# Patient Record
Sex: Female | Born: 1950 | Race: Black or African American | Hispanic: No | Smoking: Former smoker
Health system: Southern US, Community
[De-identification: ages and names within clinical notes are randomized; demographics above are authoritative.]

## PROBLEM LIST (undated history)

## (undated) DIAGNOSIS — K635 Polyp of colon: Secondary | ICD-10-CM

## (undated) DIAGNOSIS — D649 Anemia, unspecified: Secondary | ICD-10-CM

## (undated) DIAGNOSIS — B1920 Unspecified viral hepatitis C without hepatic coma: Secondary | ICD-10-CM

## (undated) DIAGNOSIS — Z972 Presence of dental prosthetic device (complete) (partial): Secondary | ICD-10-CM

## (undated) DIAGNOSIS — Z8719 Personal history of other diseases of the digestive system: Secondary | ICD-10-CM

## (undated) DIAGNOSIS — G709 Myoneural disorder, unspecified: Secondary | ICD-10-CM

## (undated) DIAGNOSIS — F419 Anxiety disorder, unspecified: Secondary | ICD-10-CM

## (undated) DIAGNOSIS — M1990 Unspecified osteoarthritis, unspecified site: Secondary | ICD-10-CM

## (undated) DIAGNOSIS — K219 Gastro-esophageal reflux disease without esophagitis: Secondary | ICD-10-CM

## (undated) DIAGNOSIS — Z973 Presence of spectacles and contact lenses: Secondary | ICD-10-CM

## (undated) DIAGNOSIS — F1020 Alcohol dependence, uncomplicated: Secondary | ICD-10-CM

## (undated) DIAGNOSIS — IMO0001 Reserved for inherently not codable concepts without codable children: Secondary | ICD-10-CM

## (undated) DIAGNOSIS — I10 Essential (primary) hypertension: Secondary | ICD-10-CM

## (undated) DIAGNOSIS — Z8711 Personal history of peptic ulcer disease: Secondary | ICD-10-CM

## (undated) DIAGNOSIS — Z9989 Dependence on other enabling machines and devices: Secondary | ICD-10-CM

## (undated) DIAGNOSIS — Z9289 Personal history of other medical treatment: Secondary | ICD-10-CM

## (undated) DIAGNOSIS — F329 Major depressive disorder, single episode, unspecified: Secondary | ICD-10-CM

## (undated) DIAGNOSIS — F32A Depression, unspecified: Secondary | ICD-10-CM

## (undated) DIAGNOSIS — K7460 Unspecified cirrhosis of liver: Secondary | ICD-10-CM

## (undated) DIAGNOSIS — G4700 Insomnia, unspecified: Secondary | ICD-10-CM

## (undated) HISTORY — DX: Reserved for inherently not codable concepts without codable children: IMO0001

## (undated) HISTORY — PX: DILATION AND CURETTAGE OF UTERUS: SHX78

## (undated) HISTORY — DX: Personal history of other diseases of the digestive system: Z87.19

## (undated) HISTORY — DX: Alcohol dependence, uncomplicated: F10.20

## (undated) HISTORY — DX: Personal history of peptic ulcer disease: Z87.11

## (undated) HISTORY — DX: Unspecified cirrhosis of liver: K74.60

## (undated) HISTORY — PX: ANAL FISSURE REPAIR: SHX2312

## (undated) HISTORY — DX: Unspecified osteoarthritis, unspecified site: M19.90

## (undated) HISTORY — DX: Personal history of other medical treatment: Z92.89

## (undated) HISTORY — PX: TUBAL LIGATION: SHX77

## (undated) HISTORY — PX: ROTATOR CUFF REPAIR: SHX139

## (undated) HISTORY — PX: OTHER SURGICAL HISTORY: SHX169

## (undated) HISTORY — PX: CARPAL TUNNEL RELEASE: SHX101

## (undated) HISTORY — DX: Polyp of colon: K63.5

## (undated) HISTORY — DX: Unspecified viral hepatitis C without hepatic coma: B19.20

---

## 1999-10-23 ENCOUNTER — Emergency Department (HOSPITAL_COMMUNITY): Admission: EM | Admit: 1999-10-23 | Discharge: 1999-10-23 | Payer: Self-pay | Admitting: *Deleted

## 2001-02-02 ENCOUNTER — Ambulatory Visit (HOSPITAL_BASED_OUTPATIENT_CLINIC_OR_DEPARTMENT_OTHER): Admission: RE | Admit: 2001-02-02 | Discharge: 2001-02-02 | Payer: Self-pay | Admitting: Orthopaedic Surgery

## 2002-02-01 ENCOUNTER — Ambulatory Visit (HOSPITAL_BASED_OUTPATIENT_CLINIC_OR_DEPARTMENT_OTHER): Admission: RE | Admit: 2002-02-01 | Discharge: 2002-02-01 | Payer: Self-pay | Admitting: Orthopedic Surgery

## 2002-03-25 ENCOUNTER — Ambulatory Visit (HOSPITAL_BASED_OUTPATIENT_CLINIC_OR_DEPARTMENT_OTHER): Admission: RE | Admit: 2002-03-25 | Discharge: 2002-03-25 | Payer: Self-pay | Admitting: Orthopedic Surgery

## 2002-09-19 ENCOUNTER — Ambulatory Visit (HOSPITAL_COMMUNITY): Admission: RE | Admit: 2002-09-19 | Discharge: 2002-09-19 | Payer: Self-pay

## 2003-06-03 ENCOUNTER — Emergency Department (HOSPITAL_COMMUNITY): Admission: EM | Admit: 2003-06-03 | Discharge: 2003-06-03 | Payer: Self-pay | Admitting: Emergency Medicine

## 2003-07-04 ENCOUNTER — Ambulatory Visit (HOSPITAL_BASED_OUTPATIENT_CLINIC_OR_DEPARTMENT_OTHER): Admission: RE | Admit: 2003-07-04 | Discharge: 2003-07-04 | Payer: Self-pay | Admitting: Orthopaedic Surgery

## 2003-08-10 ENCOUNTER — Emergency Department (HOSPITAL_COMMUNITY): Admission: EM | Admit: 2003-08-10 | Discharge: 2003-08-11 | Payer: Self-pay | Admitting: Emergency Medicine

## 2003-08-10 ENCOUNTER — Encounter: Payer: Self-pay | Admitting: Emergency Medicine

## 2004-09-23 ENCOUNTER — Emergency Department (HOSPITAL_COMMUNITY): Admission: EM | Admit: 2004-09-23 | Discharge: 2004-09-23 | Payer: Self-pay | Admitting: Emergency Medicine

## 2005-02-26 ENCOUNTER — Ambulatory Visit: Payer: Self-pay | Admitting: Internal Medicine

## 2005-03-05 ENCOUNTER — Ambulatory Visit: Payer: Self-pay | Admitting: *Deleted

## 2005-03-05 ENCOUNTER — Ambulatory Visit: Payer: Self-pay | Admitting: Internal Medicine

## 2005-04-02 ENCOUNTER — Ambulatory Visit: Payer: Self-pay | Admitting: Internal Medicine

## 2005-04-02 ENCOUNTER — Encounter (INDEPENDENT_AMBULATORY_CARE_PROVIDER_SITE_OTHER): Payer: Self-pay | Admitting: Internal Medicine

## 2005-04-02 LAB — CONVERTED CEMR LAB
ALT: 187 units/L
AST: 140 units/L

## 2005-04-18 ENCOUNTER — Ambulatory Visit: Payer: Self-pay | Admitting: Internal Medicine

## 2005-04-18 ENCOUNTER — Encounter (INDEPENDENT_AMBULATORY_CARE_PROVIDER_SITE_OTHER): Payer: Self-pay | Admitting: Internal Medicine

## 2005-04-18 DIAGNOSIS — B182 Chronic viral hepatitis C: Secondary | ICD-10-CM | POA: Insufficient documentation

## 2005-04-18 LAB — CONVERTED CEMR LAB: Hep B E Ab: NEGATIVE

## 2005-04-25 ENCOUNTER — Ambulatory Visit: Payer: Self-pay | Admitting: Internal Medicine

## 2005-04-28 ENCOUNTER — Ambulatory Visit: Payer: Self-pay | Admitting: Internal Medicine

## 2005-08-10 ENCOUNTER — Encounter (INDEPENDENT_AMBULATORY_CARE_PROVIDER_SITE_OTHER): Payer: Self-pay | Admitting: Internal Medicine

## 2005-09-02 ENCOUNTER — Ambulatory Visit: Payer: Self-pay | Admitting: Internal Medicine

## 2005-10-06 ENCOUNTER — Ambulatory Visit: Payer: Self-pay | Admitting: Internal Medicine

## 2005-10-06 ENCOUNTER — Encounter (INDEPENDENT_AMBULATORY_CARE_PROVIDER_SITE_OTHER): Payer: Self-pay | Admitting: Internal Medicine

## 2005-10-06 LAB — CONVERTED CEMR LAB: HCV Genotype: 1

## 2006-02-27 ENCOUNTER — Emergency Department (HOSPITAL_COMMUNITY): Admission: EM | Admit: 2006-02-27 | Discharge: 2006-02-27 | Payer: Self-pay | Admitting: Family Medicine

## 2006-04-22 ENCOUNTER — Emergency Department (HOSPITAL_COMMUNITY): Admission: EM | Admit: 2006-04-22 | Discharge: 2006-04-22 | Payer: Self-pay | Admitting: Family Medicine

## 2006-11-10 DIAGNOSIS — IMO0001 Reserved for inherently not codable concepts without codable children: Secondary | ICD-10-CM

## 2006-11-10 HISTORY — DX: Reserved for inherently not codable concepts without codable children: IMO0001

## 2007-03-05 ENCOUNTER — Inpatient Hospital Stay (HOSPITAL_COMMUNITY): Admission: EM | Admit: 2007-03-05 | Discharge: 2007-03-06 | Payer: Self-pay | Admitting: Family Medicine

## 2007-03-05 ENCOUNTER — Encounter (INDEPENDENT_AMBULATORY_CARE_PROVIDER_SITE_OTHER): Payer: Self-pay | Admitting: Internal Medicine

## 2007-03-05 ENCOUNTER — Ambulatory Visit: Payer: Self-pay | Admitting: Family Medicine

## 2007-03-05 DIAGNOSIS — R079 Chest pain, unspecified: Secondary | ICD-10-CM | POA: Insufficient documentation

## 2007-03-05 DIAGNOSIS — E7800 Pure hypercholesterolemia, unspecified: Secondary | ICD-10-CM | POA: Insufficient documentation

## 2007-03-05 DIAGNOSIS — R0789 Other chest pain: Secondary | ICD-10-CM | POA: Insufficient documentation

## 2007-07-05 ENCOUNTER — Encounter (INDEPENDENT_AMBULATORY_CARE_PROVIDER_SITE_OTHER): Payer: Self-pay | Admitting: Internal Medicine

## 2007-07-05 DIAGNOSIS — F329 Major depressive disorder, single episode, unspecified: Secondary | ICD-10-CM | POA: Insufficient documentation

## 2007-07-05 DIAGNOSIS — I10 Essential (primary) hypertension: Secondary | ICD-10-CM | POA: Insufficient documentation

## 2007-07-05 DIAGNOSIS — F32A Depression, unspecified: Secondary | ICD-10-CM | POA: Insufficient documentation

## 2007-07-05 DIAGNOSIS — K219 Gastro-esophageal reflux disease without esophagitis: Secondary | ICD-10-CM | POA: Insufficient documentation

## 2007-07-06 ENCOUNTER — Telehealth (INDEPENDENT_AMBULATORY_CARE_PROVIDER_SITE_OTHER): Payer: Self-pay | Admitting: Internal Medicine

## 2007-07-28 ENCOUNTER — Encounter (INDEPENDENT_AMBULATORY_CARE_PROVIDER_SITE_OTHER): Payer: Self-pay | Admitting: *Deleted

## 2007-07-29 ENCOUNTER — Ambulatory Visit: Payer: Self-pay | Admitting: Internal Medicine

## 2007-08-06 ENCOUNTER — Encounter (INDEPENDENT_AMBULATORY_CARE_PROVIDER_SITE_OTHER): Payer: Self-pay | Admitting: Internal Medicine

## 2007-08-18 ENCOUNTER — Encounter (INDEPENDENT_AMBULATORY_CARE_PROVIDER_SITE_OTHER): Payer: Self-pay | Admitting: Internal Medicine

## 2007-08-19 ENCOUNTER — Ambulatory Visit: Admission: RE | Admit: 2007-08-19 | Discharge: 2007-08-19 | Payer: Self-pay | Admitting: Cardiology

## 2007-08-19 DIAGNOSIS — IMO0001 Reserved for inherently not codable concepts without codable children: Secondary | ICD-10-CM

## 2007-08-19 HISTORY — DX: Reserved for inherently not codable concepts without codable children: IMO0001

## 2007-09-13 ENCOUNTER — Encounter (INDEPENDENT_AMBULATORY_CARE_PROVIDER_SITE_OTHER): Payer: Self-pay | Admitting: Internal Medicine

## 2007-10-01 ENCOUNTER — Ambulatory Visit: Payer: Self-pay | Admitting: Internal Medicine

## 2007-11-19 ENCOUNTER — Ambulatory Visit: Payer: Self-pay | Admitting: Internal Medicine

## 2007-11-23 ENCOUNTER — Ambulatory Visit (HOSPITAL_COMMUNITY): Admission: RE | Admit: 2007-11-23 | Discharge: 2007-11-23 | Payer: Self-pay | Admitting: Internal Medicine

## 2007-11-23 LAB — CONVERTED CEMR LAB
Eosinophils Absolute: 0 10*3/uL (ref 0.0–0.7)
Eosinophils Relative: 0 % (ref 0–5)
HCT: 42.7 % (ref 36.0–46.0)
Lymphs Abs: 3.2 10*3/uL (ref 0.7–4.0)
MCV: 95.5 fL (ref 78.0–100.0)
Platelets: 124 10*3/uL — ABNORMAL LOW (ref 150–400)
WBC: 6 10*3/uL (ref 4.0–10.5)

## 2007-12-03 ENCOUNTER — Encounter (INDEPENDENT_AMBULATORY_CARE_PROVIDER_SITE_OTHER): Payer: Self-pay | Admitting: Internal Medicine

## 2007-12-22 ENCOUNTER — Ambulatory Visit (HOSPITAL_COMMUNITY): Admission: RE | Admit: 2007-12-22 | Discharge: 2007-12-22 | Payer: Self-pay | Admitting: Internal Medicine

## 2007-12-25 ENCOUNTER — Encounter (INDEPENDENT_AMBULATORY_CARE_PROVIDER_SITE_OTHER): Payer: Self-pay | Admitting: Internal Medicine

## 2007-12-25 DIAGNOSIS — M5137 Other intervertebral disc degeneration, lumbosacral region: Secondary | ICD-10-CM | POA: Insufficient documentation

## 2007-12-29 ENCOUNTER — Encounter (INDEPENDENT_AMBULATORY_CARE_PROVIDER_SITE_OTHER): Payer: Self-pay | Admitting: Internal Medicine

## 2008-01-04 ENCOUNTER — Telehealth (INDEPENDENT_AMBULATORY_CARE_PROVIDER_SITE_OTHER): Payer: Self-pay | Admitting: Internal Medicine

## 2008-01-21 ENCOUNTER — Ambulatory Visit: Payer: Self-pay | Admitting: Internal Medicine

## 2008-03-24 ENCOUNTER — Encounter: Admission: RE | Admit: 2008-03-24 | Discharge: 2008-03-24 | Payer: Self-pay | Admitting: General Surgery

## 2008-03-27 ENCOUNTER — Ambulatory Visit (HOSPITAL_BASED_OUTPATIENT_CLINIC_OR_DEPARTMENT_OTHER): Admission: RE | Admit: 2008-03-27 | Discharge: 2008-03-27 | Payer: Self-pay | Admitting: General Surgery

## 2008-05-31 ENCOUNTER — Telehealth (INDEPENDENT_AMBULATORY_CARE_PROVIDER_SITE_OTHER): Payer: Self-pay | Admitting: Internal Medicine

## 2008-06-06 ENCOUNTER — Telehealth (INDEPENDENT_AMBULATORY_CARE_PROVIDER_SITE_OTHER): Payer: Self-pay | Admitting: Internal Medicine

## 2008-06-12 ENCOUNTER — Telehealth (INDEPENDENT_AMBULATORY_CARE_PROVIDER_SITE_OTHER): Payer: Self-pay | Admitting: Internal Medicine

## 2008-06-20 ENCOUNTER — Telehealth (INDEPENDENT_AMBULATORY_CARE_PROVIDER_SITE_OTHER): Payer: Self-pay | Admitting: Family Medicine

## 2008-07-10 LAB — CONVERTED CEMR LAB: Pap Smear: NORMAL

## 2008-07-13 ENCOUNTER — Ambulatory Visit: Payer: Self-pay | Admitting: Internal Medicine

## 2008-07-13 DIAGNOSIS — G5600 Carpal tunnel syndrome, unspecified upper limb: Secondary | ICD-10-CM | POA: Insufficient documentation

## 2008-09-07 ENCOUNTER — Telehealth (INDEPENDENT_AMBULATORY_CARE_PROVIDER_SITE_OTHER): Payer: Self-pay | Admitting: Internal Medicine

## 2008-09-12 ENCOUNTER — Telehealth (INDEPENDENT_AMBULATORY_CARE_PROVIDER_SITE_OTHER): Payer: Self-pay | Admitting: *Deleted

## 2008-09-14 ENCOUNTER — Telehealth (INDEPENDENT_AMBULATORY_CARE_PROVIDER_SITE_OTHER): Payer: Self-pay | Admitting: Internal Medicine

## 2008-09-14 ENCOUNTER — Encounter (INDEPENDENT_AMBULATORY_CARE_PROVIDER_SITE_OTHER): Payer: Self-pay | Admitting: Internal Medicine

## 2008-09-20 ENCOUNTER — Telehealth (INDEPENDENT_AMBULATORY_CARE_PROVIDER_SITE_OTHER): Payer: Self-pay | Admitting: Internal Medicine

## 2008-09-27 ENCOUNTER — Telehealth (INDEPENDENT_AMBULATORY_CARE_PROVIDER_SITE_OTHER): Payer: Self-pay | Admitting: Internal Medicine

## 2008-09-27 ENCOUNTER — Encounter (INDEPENDENT_AMBULATORY_CARE_PROVIDER_SITE_OTHER): Payer: Self-pay | Admitting: Internal Medicine

## 2008-11-07 ENCOUNTER — Ambulatory Visit: Payer: Self-pay | Admitting: Internal Medicine

## 2008-11-07 DIAGNOSIS — R259 Unspecified abnormal involuntary movements: Secondary | ICD-10-CM | POA: Insufficient documentation

## 2008-12-08 ENCOUNTER — Encounter (INDEPENDENT_AMBULATORY_CARE_PROVIDER_SITE_OTHER): Payer: Self-pay | Admitting: Internal Medicine

## 2008-12-14 ENCOUNTER — Ambulatory Visit: Payer: Self-pay | Admitting: Gastroenterology

## 2008-12-14 ENCOUNTER — Encounter (INDEPENDENT_AMBULATORY_CARE_PROVIDER_SITE_OTHER): Payer: Self-pay | Admitting: Internal Medicine

## 2008-12-25 ENCOUNTER — Emergency Department (HOSPITAL_COMMUNITY): Admission: EM | Admit: 2008-12-25 | Discharge: 2008-12-25 | Payer: Self-pay | Admitting: Family Medicine

## 2009-01-03 ENCOUNTER — Ambulatory Visit (HOSPITAL_COMMUNITY): Admission: RE | Admit: 2009-01-03 | Discharge: 2009-01-03 | Payer: Self-pay | Admitting: Gastroenterology

## 2009-01-03 ENCOUNTER — Encounter (INDEPENDENT_AMBULATORY_CARE_PROVIDER_SITE_OTHER): Payer: Self-pay | Admitting: Interventional Radiology

## 2009-01-11 ENCOUNTER — Encounter (INDEPENDENT_AMBULATORY_CARE_PROVIDER_SITE_OTHER): Payer: Self-pay | Admitting: Internal Medicine

## 2009-01-11 ENCOUNTER — Ambulatory Visit: Payer: Self-pay | Admitting: Gastroenterology

## 2009-01-24 ENCOUNTER — Ambulatory Visit (HOSPITAL_COMMUNITY): Admission: RE | Admit: 2009-01-24 | Discharge: 2009-01-24 | Payer: Self-pay | Admitting: Gastroenterology

## 2009-02-06 ENCOUNTER — Telehealth (INDEPENDENT_AMBULATORY_CARE_PROVIDER_SITE_OTHER): Payer: Self-pay | Admitting: Internal Medicine

## 2009-03-08 ENCOUNTER — Ambulatory Visit: Payer: Self-pay | Admitting: Internal Medicine

## 2009-03-08 DIAGNOSIS — E663 Overweight: Secondary | ICD-10-CM | POA: Insufficient documentation

## 2009-03-14 ENCOUNTER — Encounter (INDEPENDENT_AMBULATORY_CARE_PROVIDER_SITE_OTHER): Payer: Self-pay | Admitting: Internal Medicine

## 2009-03-14 ENCOUNTER — Encounter: Admission: RE | Admit: 2009-03-14 | Discharge: 2009-04-12 | Payer: Self-pay | Admitting: Internal Medicine

## 2009-03-21 ENCOUNTER — Encounter (INDEPENDENT_AMBULATORY_CARE_PROVIDER_SITE_OTHER): Payer: Self-pay | Admitting: Internal Medicine

## 2009-03-21 ENCOUNTER — Encounter: Admission: RE | Admit: 2009-03-21 | Discharge: 2009-05-08 | Payer: Self-pay | Admitting: Internal Medicine

## 2009-04-13 ENCOUNTER — Ambulatory Visit: Payer: Self-pay | Admitting: Internal Medicine

## 2009-04-13 LAB — CONVERTED CEMR LAB
Alkaline Phosphatase: 119 units/L — ABNORMAL HIGH (ref 39–117)
BUN: 17 mg/dL (ref 6–23)
Basophils Absolute: 0 10*3/uL (ref 0.0–0.1)
Basophils Relative: 0 % (ref 0–1)
Blood in Urine, dipstick: NEGATIVE
CO2: 22 meq/L (ref 19–32)
Cholesterol: 186 mg/dL (ref 0–200)
Eosinophils Absolute: 0.1 10*3/uL (ref 0.0–0.7)
Eosinophils Relative: 2 % (ref 0–5)
Glucose, Bld: 90 mg/dL (ref 70–99)
HCT: 38 % (ref 36.0–46.0)
HDL: 31 mg/dL — ABNORMAL LOW (ref >39)
Hemoglobin: 12.3 g/dL (ref 12.0–15.0)
LDL Cholesterol: 129 mg/dL — ABNORMAL HIGH (ref 0–99)
MCHC: 32.4 g/dL (ref 30.0–36.0)
MCV: 94.5 fL (ref 78.0–100.0)
Monocytes Absolute: 0.7 10*3/uL (ref 0.1–1.0)
Nitrite: NEGATIVE
Protein, U semiquant: NEGATIVE
RDW: 13.8 % (ref 11.5–15.5)
Specific Gravity, Urine: 1.01
Total Bilirubin: 0.5 mg/dL (ref 0.3–1.2)
Triglycerides: 132 mg/dL (ref <150)
Urobilinogen, UA: 0.2
VLDL: 26 mg/dL (ref 0–40)

## 2009-04-18 ENCOUNTER — Encounter (INDEPENDENT_AMBULATORY_CARE_PROVIDER_SITE_OTHER): Payer: Self-pay | Admitting: Internal Medicine

## 2009-05-17 ENCOUNTER — Encounter (INDEPENDENT_AMBULATORY_CARE_PROVIDER_SITE_OTHER): Payer: Self-pay | Admitting: Internal Medicine

## 2009-06-29 ENCOUNTER — Ambulatory Visit: Payer: Self-pay | Admitting: Internal Medicine

## 2009-06-29 DIAGNOSIS — M5412 Radiculopathy, cervical region: Secondary | ICD-10-CM | POA: Insufficient documentation

## 2009-07-05 ENCOUNTER — Ambulatory Visit: Payer: Self-pay | Admitting: Gastroenterology

## 2009-07-09 ENCOUNTER — Ambulatory Visit (HOSPITAL_COMMUNITY): Admission: RE | Admit: 2009-07-09 | Discharge: 2009-07-09 | Payer: Self-pay | Admitting: Internal Medicine

## 2009-07-09 ENCOUNTER — Telehealth (INDEPENDENT_AMBULATORY_CARE_PROVIDER_SITE_OTHER): Payer: Self-pay | Admitting: Internal Medicine

## 2009-07-10 ENCOUNTER — Telehealth (INDEPENDENT_AMBULATORY_CARE_PROVIDER_SITE_OTHER): Payer: Self-pay | Admitting: Internal Medicine

## 2009-07-13 ENCOUNTER — Ambulatory Visit (HOSPITAL_COMMUNITY): Admission: RE | Admit: 2009-07-13 | Discharge: 2009-07-13 | Payer: Self-pay | Admitting: Gastroenterology

## 2009-07-23 ENCOUNTER — Encounter (INDEPENDENT_AMBULATORY_CARE_PROVIDER_SITE_OTHER): Payer: Self-pay | Admitting: *Deleted

## 2009-08-06 ENCOUNTER — Ambulatory Visit: Payer: Self-pay | Admitting: Family Medicine

## 2009-10-18 ENCOUNTER — Ambulatory Visit: Payer: Self-pay | Admitting: Family Medicine

## 2009-12-03 ENCOUNTER — Ambulatory Visit: Payer: Self-pay | Admitting: Family Medicine

## 2009-12-11 ENCOUNTER — Ambulatory Visit (HOSPITAL_COMMUNITY): Admission: RE | Admit: 2009-12-11 | Discharge: 2009-12-11 | Payer: Self-pay | Admitting: Family Medicine

## 2009-12-27 ENCOUNTER — Encounter (INDEPENDENT_AMBULATORY_CARE_PROVIDER_SITE_OTHER): Payer: Self-pay | Admitting: Internal Medicine

## 2009-12-27 ENCOUNTER — Ambulatory Visit: Payer: Self-pay | Admitting: Gastroenterology

## 2010-01-07 ENCOUNTER — Ambulatory Visit: Payer: Self-pay | Admitting: Family Medicine

## 2010-01-16 ENCOUNTER — Ambulatory Visit (HOSPITAL_COMMUNITY): Admission: RE | Admit: 2010-01-16 | Discharge: 2010-01-16 | Payer: Self-pay | Admitting: Gastroenterology

## 2010-01-29 ENCOUNTER — Encounter (INDEPENDENT_AMBULATORY_CARE_PROVIDER_SITE_OTHER): Payer: Self-pay | Admitting: *Deleted

## 2010-05-29 ENCOUNTER — Ambulatory Visit: Payer: Self-pay | Admitting: Family Medicine

## 2010-05-29 DIAGNOSIS — R002 Palpitations: Secondary | ICD-10-CM | POA: Insufficient documentation

## 2010-05-29 LAB — CONVERTED CEMR LAB
BUN: 13 mg/dL (ref 6–23)
Chloride: 107 meq/L (ref 96–112)
Creatinine, Ser: 0.67 mg/dL (ref 0.40–1.20)
Glucose, Bld: 87 mg/dL (ref 70–99)

## 2010-06-03 ENCOUNTER — Encounter: Payer: Self-pay | Admitting: Family Medicine

## 2010-06-12 ENCOUNTER — Encounter: Payer: Self-pay | Admitting: Family Medicine

## 2010-08-28 ENCOUNTER — Ambulatory Visit: Payer: Self-pay | Admitting: Family Medicine

## 2010-08-28 DIAGNOSIS — IMO0001 Reserved for inherently not codable concepts without codable children: Secondary | ICD-10-CM | POA: Insufficient documentation

## 2010-08-28 LAB — CONVERTED CEMR LAB
AST: 195 units/L — ABNORMAL HIGH (ref 0–37)
Albumin: 4.2 g/dL (ref 3.5–5.2)
Alkaline Phosphatase: 129 units/L — ABNORMAL HIGH (ref 39–117)
BUN: 14 mg/dL (ref 6–23)
Glucose, Bld: 87 mg/dL (ref 70–99)
HCT: 39.7 % (ref 36.0–46.0)
Hemoglobin: 13.6 g/dL (ref 12.0–15.0)
MCHC: 34.3 g/dL (ref 30.0–36.0)
MCV: 91.7 fL (ref 78.0–100.0)
Potassium: 3.9 meq/L (ref 3.5–5.3)
RBC: 4.33 M/uL (ref 3.87–5.11)
RDW: 13.7 % (ref 11.5–15.5)
Sodium: 140 meq/L (ref 135–145)
Total Bilirubin: 0.6 mg/dL (ref 0.3–1.2)
Total CK: 225 units/L — ABNORMAL HIGH (ref 7–177)

## 2010-08-29 ENCOUNTER — Encounter: Payer: Self-pay | Admitting: Family Medicine

## 2010-09-02 ENCOUNTER — Ambulatory Visit: Payer: Self-pay | Admitting: Family Medicine

## 2010-09-02 ENCOUNTER — Encounter: Payer: Self-pay | Admitting: Family Medicine

## 2010-09-02 ENCOUNTER — Ambulatory Visit (HOSPITAL_COMMUNITY): Admission: RE | Admit: 2010-09-02 | Discharge: 2010-09-02 | Payer: Self-pay | Admitting: Family Medicine

## 2010-09-04 ENCOUNTER — Encounter: Admission: RE | Admit: 2010-09-04 | Discharge: 2010-09-04 | Payer: Self-pay | Admitting: Family Medicine

## 2010-10-08 ENCOUNTER — Encounter (INDEPENDENT_AMBULATORY_CARE_PROVIDER_SITE_OTHER): Payer: Self-pay | Admitting: *Deleted

## 2010-10-09 ENCOUNTER — Encounter (INDEPENDENT_AMBULATORY_CARE_PROVIDER_SITE_OTHER): Payer: Self-pay | Admitting: *Deleted

## 2010-10-28 ENCOUNTER — Ambulatory Visit: Payer: Self-pay | Admitting: Family Medicine

## 2010-11-30 ENCOUNTER — Encounter: Payer: Self-pay | Admitting: Radiology

## 2010-12-02 ENCOUNTER — Encounter: Payer: Self-pay | Admitting: Internal Medicine

## 2010-12-12 NOTE — Assessment & Plan Note (Signed)
Summary: discuss pain/eo   Vital Signs:  Patient profile:   60 year old female Height:      65 inches Weight:      199.8 pounds BMI:     33.37 Temp:     98.5 degrees F oral Pulse rate:   82 / minute BP sitting:   135 / 85  (left arm) Cuff size:   large  Vitals Entered By: Jimmy Footman, CMA (October 28, 2010 1:51 PM) CC: Chronic lower back, hip, & left leg pain Is Patient Diabetic? No Pain Assessment Patient in pain? yes     Location: lower back& hip Intensity: 10 Type: sharp   CC:  Chronic lower back, hip, and & left leg pain.  History of Present Illness: Pain in lower back and hips and anterior left leg All these pains happen intermittently sometimes worse than others.  Has a tingling in her left anter thigh and sometimes down the buttocks bilaterally.  Hips feel stiff after not using.  No joint soft tissue swelling or redness or weakness or incontinence.  Medications help some - tramadol, flexaril and diclofenac.  Is active walking but does not feel she can work.  HYPERTENSION Disease Monitoring   Blood pressure range: not usually checking      Chest pain: N     Dyspnea:N Medications   Compliance: daily diuretic    Lightheadedness: N     Edema:N  Chest Pain resolved  ROS - as above PMH - Medications reviewed and updated in medication list.  Smoking Status noted in VS form    Habits & Providers  Alcohol-Tobacco-Diet     Tobacco Status: quit  Current Medications (verified): 1)  Prozac 20 Mg Caps (Fluoxetine Hcl) .... 3 Daily For Depression 2)  Adult Aspirin Low Strength 81 Mg  Chew (Aspirin) .Marland Kitchen.. 1 Tab By Mouth Daily With Food 3)  Flexeril 10 Mg  Tabs (Cyclobenzaprine Hcl) .Marland Kitchen.. 1 Tab By Mouth At Night When Have Spasms Do Not Take More Than 1-2 X A Week 4)  Triamterene-Hctz 37.5-25 Mg  Tabs (Triamterene-Hctz) .Marland Kitchen.. 1 Tab By Mouth Daily 5)  Trazodone Hcl 50 Mg Tabs (Trazodone Hcl) .... 1/2 By Mouth At Bedtime As Needed Insomnia 6)  Tramadol Hcl 50 Mg  Tabs  (Tramadol Hcl) .Marland Kitchen.. 1 By Mouth Two Times A Day As Needed Joint Pain 7)  Amlodipine Besylate 10 Mg  Tabs (Amlodipine Besylate) .... Once Daily For Blood Pressure 8)  Abilify 2 Mg Tabs (Aripiprazole) .... Per Mh 9)  Diclofenac Sodium 75 Mg Tbec (Diclofenac Sodium) .Marland Kitchen.. 1 By Mouth Two Times A Day With Food As Needed For Chest Pain  Allergies: 1)  ! Quinine  Physical Exam  General:  Well-developed,well-nourished,in no acute distress; alert,appropriate and cooperative throughout examination Msk:  mildly tneder with hip ROM.  Some post thigh aching with SLR,   diffusely mildly tender over LS spine without deformity or focal pain or redness  Neurologic:  Able to walk on tip toes and heels. No straight leg raise    Impression & Recommendations:  Problem # 1:  DEGENERATIVE DISC DISEASE, LUMBOSACRAL SPINE (ICD-722.52)  I think this is her main pain cause.  Discussed lack of cure but to control pain iwth current medications and keep active   Orders: [facility]- Est  Level 4 ([address]191)  Problem # 2:  HYPERTENSION (ICD-401.9)  well controlled  Her updated medication list for this problem includes:    Triamterene-hctz 37.5-25 Mg Tabs (Triamterene-hctz) .Marland Kitchen... 1 tab by mouth  daily    Amlodipine Besylate 10 Mg Tabs (Amlodipine besylate) ..... Once daily for blood pressure  BP today: 135/85 Prior BP: 149/91 (09/02/2010)  Labs Reviewed: K+: 3.9 (08/28/2010) Creat: : 0.67 (08/28/2010)   Chol: 186 (04/13/2009)   HDL: 31 (04/13/2009)   LDL: 129 (04/13/2009)   TG: 132 (04/13/2009)  Orders: [facility]- Est  Level 4 ([address]214)  Complete Medication List: 1)  Prozac 20 Mg Caps (Fluoxetine hcl) .... 3 daily for depression 2)  Adult Aspirin Low Strength 81 Mg Chew (Aspirin) .Marland Kitchen.. 1 tab by mouth daily with food 3)  Flexeril 10 Mg Tabs (Cyclobenzaprine hcl) .Marland Kitchen.. 1 tab by mouth at night when have spasms do not take more than 1-2 x a week 4)  Triamterene-hctz 37.5-25 Mg Tabs (Triamterene-hctz) .Marland Kitchen.. 1 tab by mouth daily 5)   Trazodone Hcl 50 Mg Tabs (Trazodone hcl) .... 1/2 by mouth at bedtime as needed insomnia 6)  Tramadol Hcl 50 Mg Tabs (Tramadol hcl) .Marland Kitchen.. 1 by mouth two times a day as needed joint pain 7)  Amlodipine Besylate 10 Mg Tabs (Amlodipine besylate) .... Once daily for blood pressure 8)  Abilify 2 Mg Tabs (Aripiprazole) .... Per mh 9)  Diclofenac Sodium 75 Mg Tbec (Diclofenac sodium) .Marland Kitchen.. 1 by mouth two times a day with food as needed for chest pain  Patient Instructions: 1)  [facility] about follow up 2)  Discuss ADD with Mental Health 3)  the way to deal with arthritis - is weight control, regular exercise but not prolonged  4)  Use pain medicines as needed they will not prevent or cure but help with the pain 5)  [facility] Prescriptions: DICLOFENAC SODIUM 75 MG TBEC (DICLOFENAC SODIUM) 1 by mouth two times a day with food as needed for chest pain  #60 x 3   Entered and Authorized by:   Pearlean Brownie MD   Signed by:   Pearlean Brownie MD on 10/28/2010   Method used:   [facility]ron[facility] to        [facility]  [address] [phone]* (retail)       [address]       [place], [place]  [address]       Ph: [phone] or [phone]       Fax: [phone]   RxID:   [id number] TRAMADOL HCL 50 MG  TABS (TRAMADOL HCL) 1 by mouth two times a day as needed joint pain  #60 x 3   Entered and Authorized by:   Pearlean Brownie MD   Signed by:   Pearlean Brownie MD on 10/28/2010   Method used:   [facility]        [facility]  [address] [phone]* (retail)       [address]       [place], [place]  [id number]       Ph: [phone] or [phone]       Fax: [phone]   RxID:   [id number] FLEXERIL 10 MG  TABS (CYCLOBENZAPRINE HCL) 1 tab by mouth at night when have spasms do not take more than 1-2 x a week  #15 x 1   Entered and Authorized by:   Pearlean Brownie  MD   Signed by:   Pearlean Brownie MD on 10/28/2010   Method used:   [facility]  [address] [phone]* (retail)       [address]       [place], [place]  [id number]       Ph: [phone] or [phone]       Fax: [phone]   RxID:   [id number]    Orders Added: 1)  [facility]- Est  Level 4 [[address]841]     Prevention & Chronic Care Immunizations   Influenza vaccine: Fluvax 3+  (08/28/2010)    Tetanus booster: 11/07/2008: Tdap    Pneumococcal vaccine: Pneumovax (State)  (11/07/2008)  Colorectal Screening   Hemoccult: Not documented   Hemoccult action/deferral: Not indicated  (10/18/2009)    Colonoscopy: Dr. Barrett Shell:  7 mm polyp transverse colon, anal fissure.  (12/07/2007)  Other Screening   Pap smear: Normal  (07/10/2008)   Pap smear action/deferral: Deferred-2 yr interval  (10/18/2009)    Mammogram: Normal  (06/12/2010)   Smoking status: quit  (10/28/2010)  Lipids   Total Cholesterol: 186  (04/13/2009)   LDL: 129  (04/13/2009)   LDL Direct: Not documented   HDL: 31  (04/13/2009)   Triglycerides: 132  (04/13/2009)    SGOT (AST): 195  (08/28/2010)   SGPT (ALT): 271  (08/28/2010)   Alkaline phosphatase: 129  (08/28/2010)   Total bilirubin: 0.6  (08/28/2010)  Hypertension   Last Blood Pressure: 135 / 85  (10/28/2010)   Serum creatinine: 0.67  (08/28/2010)   BMP action: Ordered   Serum potassium 3.9  (08/28/2010)    Hypertension flowsheet reviewed?: Yes   Progress toward BP goal: At goal  Self-Management Support :   Personal Goals (by the next clinic visit) :      Personal blood pressure goal: 140/90  (10/18/2009)     Personal LDL goal: 130  (10/18/2009)    Hypertension self-management support: Written self-care plan  (10/18/2009)    Lipid self-management support: Not documented     Lipid self-management support not done because: Good outcomes  (10/18/2009)

## 2010-12-12 NOTE — Assessment & Plan Note (Signed)
Summary: WI for Chest Pain   Vital Signs:  Patient profile:   60 year old female Weight:      191 pounds Temp:     94.4 degrees F Pulse rate:   82 / minute BP sitting:   149 / 91  (left arm) Cuff size:   regular  Vitals Entered By: Dennison Nancy RN (September 02, 2010 4:01 PM) CC: Walk in for chest pain Is Patient Diabetic? No Pain Assessment Patient in pain? yes     Location: chest Intensity: 10 Onset of pain  a week ago   CC:  Walk in for chest pain.  History of Present Illness: CHEST PAIN Location: epigastric area to under L breast to back Description: stabbing sharp pain  Onset: Has had on and off Happened suddently and severly while standing in line today.  Has never had with exertion.    Brought on somewhat by palpitation in her epigastric area.  Completelty reproduced when she twists/leans to her Left side  Symptoms Trauma: No Nausea/vomiting: feels slightly nauseaus Diaphoresis: N Shortness of breath: N Cough: N Edema: N Orthopnea: N Syncope: N Indigestion: N not taking prevacid any longer  Red Flags Worse with exertion: N Recent Immobility: N Cancer history: N  ROS - as above PMH - Medications reviewed and updated in medication list.  Smoking Status noted in VS form.  Had cardiac work up in 2008 by Dr Donnie Aho see notes      Habits & Providers  Alcohol-Tobacco-Diet     Alcohol drinks/day: 0     Alcohol type: wine, hard liquor     Tobacco Status: quit     Year Quit: 2004     Pack years: 10     Passive Smoke Exposure: yes  Current Medications (verified): 1)  Prozac 20 Mg Caps (Fluoxetine Hcl) .... 3 Daily For Depression 2)  Adult Aspirin Low Strength 81 Mg  Chew (Aspirin) .Marland Kitchen.. 1 Tab By Mouth Daily With Food 3)  Flexeril 10 Mg  Tabs (Cyclobenzaprine Hcl) .Marland Kitchen.. 1 Tab By Mouth At Night When Have Spasms Do Not Take More Than 1-2 X A Week 4)  Triamterene-Hctz 37.5-25 Mg  Tabs (Triamterene-Hctz) .Marland Kitchen.. 1 Tab By Mouth Daily 5)  Trazodone Hcl 50 Mg Tabs  (Trazodone Hcl) .... 1/2 By Mouth At Bedtime As Needed Insomnia 6)  Tramadol Hcl 50 Mg  Tabs (Tramadol Hcl) .Marland Kitchen.. 1 By Mouth Two Times A Day As Needed Joint Pain 7)  Amlodipine Besylate 10 Mg  Tabs (Amlodipine Besylate) .... Once Daily For Blood Pressure 8)  Abilify 2 Mg Tabs (Aripiprazole) .... Per Mh 9)  Diclofenac Sodium 75 Mg Tbec (Diclofenac Sodium) .Marland Kitchen.. 1 By Mouth Two Times A Day With Food As Needed For Chest Pain  Allergies: 1)  ! Quinine  Physical Exam  General:  Well-developed,well-nourished,in no acute distress; alert,appropriate and cooperative throughout examination.  Initially in significant distress after GI cocktail slightly better much improved after Toradol IM  Neck:  No deformities, masses, or tenderness noted. Lungs:  Normal respiratory effort, chest expands symmetrically. Lungs are clear to auscultation, no crackles or wheezes. Heart:  Normal rate and regular rhythm. S1 and S2 normal without gallop, murmur, click, rub or other extra sounds. Abdomen:  mild epigastric tenderness no masses or gdgin or rebound  Msk:  Chest - mildly tender with palp under L breast.  Severe pain when leans twists to her left side  No pleuritc component  Pulses:  2 + in radials bilaterally  Impression & Recommendations:  Problem # 1:  CHEST PAIN (ICD-786.50) most consistent with nerve impingement when leans to left side which reproduces her pain.  ECG is without ST changes.  No signs of ACS, or acute abdomin or PE or dissection.   Will get chest xray to rule out mass lesion in chest  Orders: EKG- [facility] (EKG) Ketorolac-Toradol 15mg  (D1761) CXR- 2view (CXR) [facility]- Est  Level 4 (60737)  Complete Medication List: 1)  Prozac 20 Mg Caps (Fluoxetine hcl) .... 3 daily for depression 2)  Adult Aspirin Low Strength 81 Mg Chew (Aspirin) .Marland Kitchen.. 1 tab by mouth daily with food 3)  Flexeril 10 Mg Tabs (Cyclobenzaprine hcl) .Marland Kitchen.. 1 tab by mouth at night when have spasms do not take more than 1-2 x a  week 4)  Triamterene-hctz 37.5-25 Mg Tabs (Triamterene-hctz) .Marland Kitchen.. 1 tab by mouth daily 5)  Trazodone Hcl 50 Mg Tabs (Trazodone hcl) .... 1/2 by mouth at bedtime as needed insomnia 6)  Tramadol Hcl 50 Mg Tabs (Tramadol hcl) .Marland Kitchen.. 1 by mouth two times a day as needed joint pain 7)  Amlodipine Besylate 10 Mg Tabs (Amlodipine besylate) .... Once daily for blood pressure 8)  Abilify 2 Mg Tabs (Aripiprazole) .... Per mh 9)  Diclofenac Sodium 75 Mg Tbec (Diclofenac sodium) .Marland Kitchen.. 1 by mouth two times a day with food as needed for chest pain  Patient Instructions: 1)  Please schedule a follow-up appointment in 3 weeks.  2)  If the pain becomes very severe call us 3)  If you get severe shortness of breath or severe nausea or vomiting or high fever then go to the ER 4)  Take the diclofenac two times a day as needed Can take with Tylenol and with Tramadol. Use heat on your right side for pain relief Prescriptions: DICLOFENAC SODIUM 75 MG TBEC (DICLOFENAC SODIUM) 1 by mouth two times a day with food as needed for chest pain  #30 x 1   Entered and Authorized by:   Pearlean Brownie MD   Signed by:   Pearlean Brownie MD on 09/02/2010   Method used:   [facility]        [facility]  [address] [phone]* (retail)       [address]       [place], [place]  [address]       Ph: [phone] or [phone]       Fax: [phone]   RxID:   [id number]    Medication Administration  Injection # 1:    Medication: Ketorolac-Toradol 15mg     Diagnosis: CHEST PAIN (ICD-786.50)    Route: IM    Site: RUOQ gluteus    Exp Date: 02/09/2012    Lot #: [id number]    Mfr: Baxter    Comments: Patient recieved 60mg  of Toradol    Patient tolerated injection without complications    Given by: Garen Grams LPN (September 02, 2010 4:34 PM)  Orders Added: 1)  EKG- [facility] [EKG] 2)  Ketorolac-Toradol 15mg  [J1885] 3)  CXR- 2view [CXR] 4)  [facility]- Est  Level 4 [40086]     Medication  Administration  Injection # 1:    Medication: Ketorolac-Toradol 15mg     Diagnosis: CHEST PAIN (ICD-786.50)    Route: IM    Site: RUOQ gluteus    Exp Date: 02/09/2012    Lot #: [id number]    Mfr: Baxter    Comments: Patient recieved 60mg  of Toradol  Patient tolerated injection without complications    Given by: Garen Grams LPN (September 02, 2010 4:34 PM)  Orders Added: 1)  EKG- [facility] [EKG] 2)  Ketorolac-Toradol 15mg  [J1885] 3)  CXR- 2view [CXR] 4)  [facility]- Est  Level 4 [11914]

## 2010-12-12 NOTE — Miscellaneous (Signed)
Summary: medical record request  Clinical Lists Changes  Rec'd medical record request to be sent to [facility] Faxed 07/09/10 [facility]  October 09, 2010 11:05 AM

## 2010-12-12 NOTE — Assessment & Plan Note (Signed)
Summary: f/u eo   Vital Signs:  Patient profile:   60 year old female Height:      65 inches Weight:      183.1 pounds BMI:     30.58 Temp:     97.9 degrees F oral Pulse rate:   86 / minute BP sitting:   128 / 79  (right arm) Cuff size:   large  Vitals Entered By: Garen Grams LPN (December 03, 2009 10:20 AM) CC: rt arm and lower back pain Is Patient Diabetic? No   CC:  rt arm and lower back pain.  History of Present Illness: Falls has fallen several times over the last few weeks.  After discussion seems to be mainly when she is turning or going down steps and her balance suddenly goes.  Does not lose cx or have chest pain or feel lightheadedness or have palpitations.  Specifically does not happen when stands quickly or have visual changes.  No hearing loss or chronic tinnitus.   HYPERTENSION Disease Monitoring   Blood pressure range:have almost all been < 140/90 with lowest 120/69 although did not bring in her readings       Chest pain: N     Dyspnea:N Medications   Compliance: daily   Lightheadedness: N     Edema:N  ROS - as above PMH - Medications reviewed and updated in medication list.  Smoking Status noted in VS form     Habits & Providers  Alcohol-Tobacco-Diet     Tobacco Status: never  Current Medications (verified): 1)  Lisinopril 40 Mg  Tabs (Lisinopril) .Marland Kitchen.. 1 Tab By Mouth Daily 2)  Prozac 20 Mg Caps (Fluoxetine Hcl) .... 3 Daily For Depression 3)  Adult Aspirin Low Strength 81 Mg  Chew (Aspirin) .Marland Kitchen.. 1 Tab By Mouth Daily With Food 4)  Flexeril 10 Mg  Tabs (Cyclobenzaprine Hcl) .Marland Kitchen.. 1 Tab By Mouth Q Hs As Needed Muscle Pain 5)  Triamterene-Hctz 37.5-25 Mg  Tabs (Triamterene-Hctz) .Marland Kitchen.. 1 Tab By Mouth Daily 6)  Omeprazole 20 Mg Tbec (Omeprazole) .Marland Kitchen.. 1 Tab By Mouth Daily As Needed 7)  Trazodone Hcl 50 Mg Tabs (Trazodone Hcl) .Marland Kitchen.. 1 By Mouth At Bedtime As Needed Insomnia 8)  Tramadol Hcl 50 Mg  Tabs (Tramadol Hcl) .Marland Kitchen.. 1 By Mouth Two Times A Day As Needed Joint  Pain 9)  Amlodipine Besylate 10 Mg  Tabs (Amlodipine Besylate) .... Once Daily For Blood Pressure  Allergies: 1)  ! Quinine  Physical Exam  General:  awake alert no acute distress  Lungs:  Normal respiratory effort, chest expands symmetrically. Lungs are clear to auscultation, no crackles or wheezes. Heart:  Normal rate and regular rhythm. S1 and S2 normal without gallop, murmur, click, rub or other extra sounds. Neurologic:  CN 2-7 Normal.  Upper Extremity strength 5/5.  Able to walk on tip toes and heels.  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  No red flags for cardiac disease.  Does not seem to be orthostatic.  Had cardiac work up in 2008 including stress test and echo that by report was unremarkable. Does not seem to be vertigo or focal CNS disease (tumor or CVA)  Could be related to cervical impingement since she has a hx of this and she  never got a previously ordered MRI.   Will start with neurological imaging.  If no structural lesions may suggest PT for balance training.  Discussed decreasing her CNS effecting medications  Orders: MRI (MRI) [facility]- Est  Level 4 (04540)  Problem # 2:  HYPERTENSION (ICD-401.9)  Improved continue current medications Her updated medication list for this problem includes:    Lisinopril 40 Mg Tabs (Lisinopril) .Marland Kitchen... 1 tab by mouth daily    Triamterene-hctz 37.5-25 Mg Tabs (Triamterene-hctz) .Marland Kitchen... 1 tab by mouth daily    Amlodipine Besylate 10 Mg Tabs (Amlodipine besylate) ..... Once daily for blood pressure  BP today: 128/79 Prior BP: 179/100 (10/18/2009)  Labs Reviewed: K+: 4.3 (04/13/2009) Creat: : 1.06 (04/13/2009)   Chol: 186 (04/13/2009)   HDL: 31 (04/13/2009)   LDL: 129 (04/13/2009)   TG: 132  (04/13/2009)  Orders: [facility]- Est  Level 4 ([address]214)  Complete Medication List: 1)  Lisinopril 40 Mg Tabs (Lisinopril) .Marland Kitchen.. 1 tab by mouth daily 2)  Prozac 20 Mg Caps (Fluoxetine hcl) .... 3 daily for depression 3)  Adult Aspirin Low Strength 81 Mg Chew (Aspirin) .Marland Kitchen.. 1 tab by mouth daily with food 4)  Flexeril 10 Mg Tabs (Cyclobenzaprine hcl) .Marland Kitchen.. 1 tab by mouth q hs as needed muscle pain 5)  Triamterene-hctz 37.5-25 Mg Tabs (Triamterene-hctz) .Marland Kitchen.. 1 tab by mouth daily 6)  Omeprazole 20 Mg Tbec (Omeprazole) .Marland Kitchen.. 1 tab by mouth daily as needed 7)  Trazodone Hcl 50 Mg Tabs (Trazodone hcl) .Marland Kitchen.. 1 by mouth at bedtime as needed insomnia 8)  Tramadol Hcl 50 Mg Tabs (Tramadol hcl) .Marland Kitchen.. 1 by mouth two times a day as needed joint pain 9)  Amlodipine Besylate 10 Mg Tabs (Amlodipine besylate) .... Once daily for blood pressure  Patient Instructions: 1)  Please schedule a follow-up appointment in 1 month for one month 2)  to chekc on balance 3)  I will call you about brain imaging 4)  Be very careful with turns go slowly and hold on to things 5)  Try to take tramadol and the trazadone as little as possible because the interfere with balance 6)  Write down and bring in your blood pressure readings and your medications 7)  Call for your mamogram     Prevention & Chronic Care Immunizations   Influenza vaccine: Historical  (09/10/2009)    Tetanus booster: 11/07/2008: Tdap    Pneumococcal vaccine: Pneumovax (State)  (11/07/2008)  Colorectal Screening   Hemoccult: Not documented   Hemoccult action/deferral: Not indicated  (10/18/2009)    Colonoscopy: Dr. Barrett Shell:  7 mm polyp transverse colon, anal fissure.  (12/07/2007)  Other Screening   Pap smear: Normal  (07/10/2008)   Pap smear action/deferral: Deferred-2 yr interval  (10/18/2009)    Mammogram: Not documented   Smoking status: never  (12/03/2009)  Lipids   Total Cholesterol: 186  (04/13/2009)   LDL: 129  (04/13/2009)   LDL  Direct: Not documented   HDL: 31  (04/13/2009)   Triglycerides: 132  (04/13/2009)    SGOT (AST): 199  (04/13/2009)   SGPT (ALT): 253  (04/13/2009)   Alkaline phosphatase: 119  (04/13/2009)   Total bilirubin: 0.5  (04/13/2009)  Hypertension   Last Blood Pressure: 128 / 79  (12/03/2009)   Serum creatinine: 1.06  (04/13/2009)   Serum potassium 4.3  (04/13/2009)    Hypertension flowsheet reviewed?: Yes   Progress toward BP goal: At goal  Self-Management Support :   Personal Goals (by the next clinic visit) :  Personal blood pressure goal: 140/90  (10/18/2009)     Personal LDL goal: 130  (10/18/2009)    Hypertension self-management support: Written self-care plan  (10/18/2009)    Lipid self-management support: Not documented     Lipid self-management support not done because: Good outcomes  (10/18/2009)  Appended Document: f/u eo     Clinical Lists Changes  Problems: Changed problem from ACCIDENTAL FALLS, 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

## 2010-12-12 NOTE — Letter (Signed)
Summary: Generic Letter  [facility]  [address]   [name], [place] [address]   Phone: [phone]  Fax: [phone]    08/29/2010  [facility] [address] [place], [place]  [address]  Dear Ms. Rhyne,  I hope you are feeling better.  I'm happy to say all your blood tests look the same as they did last year.  There is no sign of an inflamatory arthritis or any anemia.    Sincerely,   Pearlean Brownie MD

## 2010-12-12 NOTE — Assessment & Plan Note (Signed)
Summary: f/u visit/bmc   Vital Signs:  Patient profile:   60 year old female Height:      65 inches Weight:      190.7 pounds BMI:     31.85 Temp:     98.5 degrees F oral Pulse rate:   91 / minute BP sitting:   134 / 82  (left arm) Cuff size:   large  Vitals Entered By: Garen Grams LPN (August 28, 2010 9:51 AM) CC: joints achy, sharp pain under right breast Is Patient Diabetic? No   CC:  joints achy and sharp pain under right breast.  History of Present Illness: Muscle Joint Pains in bilateral shoulder R (with previous rotator cuff surgeries) worse than Left.  Also in low back and both hips/thighs and knees.  No focal weakness or incontinence.  Feels tired frequently.  No rashes or red swollen joints.  Chirorpracter did help her neck pain.  Had one episode of night sweats this week  Chest Pain under left breast 2-3 episodes over last few weeks.  Lasts 30 seconds.  Comes on at rest.  Never with exertion.  No shortness of breath, no radiation, not tender to touch. No masses  ROS - as above PMH - Medications reviewed and updated in medication list.  Smoking Status noted in VS form    Habits & Providers  Alcohol-Tobacco-Diet     Alcohol drinks/day: 0     Alcohol type: wine, hard liquor     Tobacco Status: quit     Year Quit: 2004     Pack years: 10     Passive Smoke Exposure: yes  Current Medications (verified): 1)  Prozac 20 Mg Caps (Fluoxetine Hcl) .... 3 Daily For Depression 2)  Adult Aspirin Low Strength 81 Mg  Chew (Aspirin) .Marland Kitchen.. 1 Tab By Mouth Daily With Food 3)  Flexeril 10 Mg  Tabs (Cyclobenzaprine Hcl) .Marland Kitchen.. 1 Tab By Mouth At Night When Have Spasms Do Not Take More Than 1-2 X A Week 4)  Triamterene-Hctz 37.5-25 Mg  Tabs (Triamterene-Hctz) .Marland Kitchen.. 1 Tab By Mouth Daily 5)  Trazodone Hcl 50 Mg Tabs (Trazodone Hcl) .... 1/2 By Mouth At Bedtime As Needed Insomnia 6)  Tramadol Hcl 50 Mg  Tabs (Tramadol Hcl) .Marland Kitchen.. 1 By Mouth Two Times A Day As Needed Joint Pain 7)   Amlodipine Besylate 10 Mg  Tabs (Amlodipine Besylate) .... Once Daily For Blood Pressure  Allergies: 1)  ! Quinine  Physical Exam  General:  Well-developed,well-nourished,in no acute distress; alert,appropriate and cooperative throughout examination Chest Wall:  points to area just lateral in right breast.  Non tender no masses No axillary adenopathy  Breasts:  No masses.  No tenderness Lungs:  Normal respiratory effort, chest expands symmetrically. Lungs are clear to auscultation, no crackles or wheezes. Heart:  Normal rate and regular rhythm. S1 and S2 normal without gallop, murmur, click, rub or other extra sounds. Msk:  Mild limitation of FROM in shoulders.  No focal tenderness.  Able to move hips knees FROM with mild pain.  No joint redness or effusions   Moderately tender to palpation over shoulders and thighs diffusely  Neurologic:  able to walk on heels and toes.  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   Her updated medication list  for this problem includes:    Adult Aspirin Low Strength 81 Mg Chew (Aspirin) .Marland Kitchen... 1 tab by mouth daily with food    Flexeril 10 Mg Tabs (Cyclobenzaprine hcl) .Marland Kitchen... 1 tab by mouth at night when have spasms do not take more than 1-2 x a week    Tramadol Hcl 50 Mg Tabs (Tramadol hcl) .Marland Kitchen... 1 by mouth two times a day as needed joint pain  Orders: CK (Creatine Kinase)-[facility] [id number]) Sed Rate (ESR)-[facility] (85651) Comp Met-[facility] (14782-95621) CBC-[facility] (30865) [facility]- Est  Level 4 (78469)  Problem # 2:  HEPATITIS C (ICD-070.51) will check CMET given abilify recently added.    Problem # 3:  CHEST PAIN (ICD-786.50)  intermittent fleeting non reproducible pain not related to exertion.  Most consistent with musculoskeletal or nerve impingement.  No red flags with observe.     Orders: [facility]- Est  Level 4 (62952)  Complete Medication List: 1)  Prozac 20 Mg Caps (Fluoxetine hcl) .... 3 daily for depression 2)  Adult Aspirin Low Strength 81 Mg Chew (Aspirin) .Marland Kitchen.. 1 tab by mouth daily with food 3)  Flexeril 10 Mg Tabs (Cyclobenzaprine hcl) .Marland Kitchen.. 1 tab by mouth at night when have spasms do not take more than 1-2 x a week 4)  Triamterene-hctz 37.5-25 Mg Tabs (Triamterene-hctz) .Marland Kitchen.. 1 tab by mouth daily 5)  Trazodone Hcl 50 Mg Tabs (Trazodone hcl) .... 1/2 by mouth at bedtime as needed insomnia 6)  Tramadol Hcl 50 Mg Tabs (Tramadol hcl) .Marland Kitchen.. 1 by mouth two times a day as needed joint pain 7)  Amlodipine Besylate 10 Mg Tabs (Amlodipine besylate) .... Once daily for blood pressure  Other Orders: Flu Vaccine 75yrs + (84132) Admin 1st Vaccine (44010)  Patient Instructions: 1)  Please schedule a follow-up appointment in 3 months .  2)  I will call you if your lab is abnormal otherwise I will send you a letter within 2 weeks.  3)  If you keep having nightsweats or start having chest pain with exercise or any breathing troubles come in 4)  You need a PAP smear in the next 6 months    Orders Added: 1)  CK (Creatine Kinase)-[facility] [82550-23250] 2)  Sed Rate (ESR)-[facility] [85651] 3)  Comp Met-[facility] [27253-66440] 4)  CBC-[facility] [85027] 5)  Flu Vaccine 89yrs + [90658] 6)  Admin 1st Vaccine [90471] 7)  [facility]- Est  Level 4 [34742]   Immunizations Administered:  Influenza Vaccine # 1:    Vaccine Type: Fluvax 3+    Site: left deltoid    Mfr: GlaxoSmithKline    Dose: 0.5 ml    Route: IM    Given by: Jone Baseman CMA    Exp. Date: 05/07/2011    Lot #: [id number]    VIS given: 06/04/10 version given August 28, 2010.  Flu Vaccine Consent Questions:    Do you have a history of severe allergic reactions to this vaccine? no    Any prior history of allergic reactions to egg and/or gelatin? no    Do you have a sensitivity to the preservative Thimersol? no    Do you have a past  history of Guillan-Barre Syndrome? no    Do you currently have an acute febrile illness? no    Have you ever had a severe reaction to latex? no    Vaccine information given and explained to patient? yes    Are you currently pregnant? no   Immunizations Administered:  Influenza Vaccine # 1:    Vaccine Type:  Fluvax 3+    Site: left deltoid    Mfr: GlaxoSmithKline    Dose: 0.5 ml    Route: IM    Given by: Jone Baseman CMA    Exp. Date: 05/07/2011    Lot #: [id number]    VIS given: 06/04/10 version given August 28, 2010.   Prevention & Chronic Care Immunizations   Influenza vaccine: Fluvax 3+  (08/28/2010)    Tetanus booster: 11/07/2008: Tdap    Pneumococcal vaccine: Pneumovax (State)  (11/07/2008)  Colorectal Screening   Hemoccult: Not documented   Hemoccult action/deferral: Not indicated  (10/18/2009)    Colonoscopy: Dr. Barrett Shell:  7 mm polyp transverse colon, anal fissure.  (12/07/2007)  Other Screening   Pap smear: Normal  (07/10/2008)   Pap smear action/deferral: Deferred-2 yr interval  (10/18/2009)    Mammogram: Normal  (06/12/2010)   Smoking status: quit  (08/28/2010)  Lipids   Total Cholesterol: 186  (04/13/2009)   LDL: 129  (04/13/2009)   LDL Direct: Not documented   HDL: 31  (04/13/2009)   Triglycerides: 132  (04/13/2009)    SGOT (AST): 199  (04/13/2009)   SGPT (ALT): 253  (04/13/2009) CMP ordered    Alkaline phosphatase: 119  (04/13/2009)   Total bilirubin: 0.5  (04/13/2009)  Hypertension   Last Blood Pressure: 134 / 82  (08/28/2010)   Serum creatinine: 0.67  (05/29/2010)   BMP action: Ordered   Serum potassium 3.5  (05/29/2010) CMP ordered     Hypertension flowsheet reviewed?: Yes   Progress toward BP goal: At goal  Self-Management Support :   Personal Goals (by the next clinic visit) :      Personal blood pressure goal: 140/90  (10/18/2009)     Personal LDL goal: 130  (10/18/2009)    Hypertension self-management support: Written  self-care plan  (10/18/2009)    Lipid self-management support: Not documented     Lipid self-management support not done because: Good outcomes  (10/18/2009)   Laboratory Results   Blood Tests   Date/Time Received: August 28, 2010 10:33 AM  Date/Time Reported: August 28, 2010 12:22 PM   SED rate: 20  mm/hr  Comments: ............test performed by...........Marland Kitchen Terese Door, CMA .............entered by...........Marland KitchenBonnie A. [place], MLS (ASCP)cm

## 2010-12-12 NOTE — Letter (Signed)
Summary: New Patient letter  [facility]  [address] [place], [place] [address]   Phone: [phone]  Fax: [phone]       01/29/2010 MRN: [id number]  [facility] [address] [place], [place]  [address]  Dear Ms. Furness,  Welcome to the Gastroenterology Division at [facility].    You are scheduled to see Dr.  Yancey Flemings on February 27, 2010 at 3:30pm on the 3rd floor at [facility], [address]  We ask that you try to arrive at our office 15 minutes prior to your appointment time to allow for check-in.  We would like you to complete the enclosed self-administered evaluation form prior to your visit and bring it with you on the day of your appointment.  We will review it with you.  Also, please bring a complete list of all your medications or, if you prefer, bring the medication bottles and we will list them.  Please bring your insurance card so that we may make a copy of it.  If your insurance requires a referral to see a specialist, please bring your referral form from your primary care physician.  Co-payments are due at the time of your visit and may be paid by cash, check or credit card.     Your office visit will consist of a consult with your physician (includes a physical exam), any laboratory testing he/she may order, scheduling of any necessary diagnostic testing (e.g. x-ray, ultrasound, CT-scan), and scheduling of a procedure (e.g. Endoscopy, Colonoscopy) if required.  Please allow enough time on your schedule to allow for any/all of these possibilities.    If you cannot keep your appointment, please call [phone] to cancel or reschedule prior to your appointment date.  This allows [place] the opportunity to schedule an appointment for another patient in need of care.  If you do not cancel or reschedule by 5 p.m. the business day prior to your appointment date, you will be charged a $50.00 late cancellation/no-show fee.    Thank you for choosing  [facility] for your medical needs.  We appreciate the opportunity to care for you.  Please visit [place] at our website  to learn more about our practice.                     Sincerely,                                                             The Gastroenterology Division

## 2010-12-12 NOTE — Assessment & Plan Note (Signed)
Summary: F/U VISIT/[facility]   Vital Signs:  Patient profile:   60 year old female Height:      65 inches Weight:      184 pounds BMI:     30.73 Temp:     98.5 degrees F oral Pulse rate:   78 / minute BP sitting:   139 / 84  (right arm) Cuff size:   regular  Vitals Entered By: Garen Grams LPN (January 07, 2010 3:00 PM) CC: f/u BP and MRI Is Patient Diabetic? No Pain Assessment Patient in pain? no        CC:  f/u BP and MRI.  History of Present Illness: Falls much improved dizziness.  No falls recently.  She attributes this to decreasing her trazadone dose.  Has mild Left arm numbness that comes and goes but no weakenss  HYPERTENSION Disease Monitoring   Blood pressure range:  no high readings     Chest pain: N     Dyspnea:N Medications   Compliance: daily   Lightheadedness: N     Edema:N  ROS - as above PMH - Medications reviewed and updated in medication list.  Smoking Status noted in VS form     Habits & Providers  Alcohol-Tobacco-Diet     Tobacco Status: never  Current Medications (verified): 1)  Lisinopril 40 Mg  Tabs (Lisinopril) .Marland Kitchen.. 1 Tab By Mouth Daily 2)  Prozac 20 Mg Caps (Fluoxetine Hcl) .... 3 Daily For Depression 3)  Adult Aspirin Low Strength 81 Mg  Chew (Aspirin) .Marland Kitchen.. 1 Tab By Mouth Daily With Food 4)  Flexeril 10 Mg  Tabs (Cyclobenzaprine Hcl) .... 1/2  Tab By Mouth Q Hs As Needed Muscle Pain 5)  Triamterene-Hctz 37.5-25 Mg  Tabs (Triamterene-Hctz) .Marland Kitchen.. 1 Tab By Mouth Daily 6)  Omeprazole 20 Mg Tbec (Omeprazole) .Marland Kitchen.. 1 Tab By Mouth Daily As Needed 7)  Trazodone Hcl 50 Mg Tabs (Trazodone Hcl) .... 1/2 By Mouth At Bedtime As Needed Insomnia 8)  Tramadol Hcl 50 Mg  Tabs (Tramadol Hcl) .Marland Kitchen.. 1 By Mouth Two Times A Day As Needed Joint Pain 9)  Amlodipine Besylate 10 Mg  Tabs (Amlodipine Besylate) .... Once Daily For Blood Pressure  Allergies: 1)  ! 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  MRI shows mild findings without need for surgery.  Discussed approaches to minimizing neck problems  Orders: [facility]- Est Level  3 (41324)  Problem # 2:  HYPERTENSION (ICD-401.9) Assessment: Improved  good control  Her updated medication list for this problem includes:    Lisinopril 40 Mg Tabs (Lisinopril) .Marland Kitchen... 1 tab by mouth daily    Triamterene-hctz 37.5-25 Mg Tabs (Triamterene-hctz) .Marland Kitchen... 1 tab by mouth daily    Amlodipine Besylate 10 Mg Tabs (Amlodipine besylate) ..... Once daily for blood pressure  BP today: 139/84 Prior BP: 128/79 (12/03/2009)  Labs Reviewed: K+: 4.3 (04/13/2009) Creat: : 1.06 (04/13/2009)   Chol: 186 (04/13/2009)   HDL: 31 (04/13/2009)   LDL: 129 (04/13/2009)   TG: 132 (04/13/2009)  Orders: [facility]- Est Level  3 (40102)  Problem # 3:  DEPRESSION (ICD-311) improved since she broke up with her boyfriend  Her updated medication list for this problem includes:    Prozac 20 Mg Caps (Fluoxetine hcl) .Marland KitchenMarland KitchenMarland KitchenMarland Kitchen 3 daily for depression    Trazodone Hcl 50 Mg Tabs (  Trazodone hcl) .Marland Kitchen... 1/2 by mouth at bedtime as needed insomnia  Problem # 4:  HEPATITIS C (ICD-070.51) plans on starting treatment in June by hepatology   Complete Medication List: 1)  Lisinopril 40 Mg Tabs (Lisinopril) .Marland Kitchen.. 1 tab by mouth daily 2)  Prozac 20 Mg Caps (Fluoxetine hcl) .... 3 daily for depression 3)  Adult Aspirin Low Strength 81 Mg Chew (Aspirin) .Marland Kitchen.. 1 tab by mouth daily with food 4)  Flexeril 10 Mg Tabs (Cyclobenzaprine hcl) .... 1/2  tab by mouth q hs as needed muscle pain 5)  Triamterene-hctz 37.5-25 Mg Tabs (Triamterene-hctz) .Marland Kitchen.. 1 tab by mouth daily 6)  Omeprazole 20 Mg Tbec (Omeprazole) .Marland Kitchen.. 1 tab by mouth daily as needed 7)  Trazodone Hcl 50 Mg Tabs (Trazodone hcl) .... 1/2 by mouth at  bedtime as needed insomnia 8)  Tramadol Hcl 50 Mg Tabs (Tramadol hcl) .Marland Kitchen.. 1 by mouth two times a day as needed joint pain 9)  Amlodipine Besylate 10 Mg Tabs (Amlodipine besylate) .... Once daily for blood pressure  Patient Instructions: 1)  Please schedule a follow-up appointment in 4 months .  2)  Schedule your mammogram.  3)  Continue exercise   Prevention & Chronic Care Immunizations   Influenza vaccine: Historical  (09/10/2009)    Tetanus booster: 11/07/2008: Tdap    Pneumococcal vaccine: Pneumovax (State)  (11/07/2008)  Colorectal Screening   Hemoccult: Not documented   Hemoccult action/deferral: Not indicated  (10/18/2009)    Colonoscopy: Dr. Barrett Shell:  7 mm polyp transverse colon, anal fissure.  (12/07/2007)  Other Screening   Pap smear: Normal  (07/10/2008)   Pap smear action/deferral: Deferred-2 yr interval  (10/18/2009)    Mammogram: Not documented   Smoking status: never  (01/07/2010)  Lipids   Total Cholesterol: 186  (04/13/2009)   LDL: 129  (04/13/2009)   LDL Direct: Not documented   HDL: 31  (04/13/2009)   Triglycerides: 132  (04/13/2009)    SGOT (AST): 199  (04/13/2009)   SGPT (ALT): 253  (04/13/2009)   Alkaline phosphatase: 119  (04/13/2009)   Total bilirubin: 0.5  (04/13/2009)    Lipid flowsheet reviewed?: Yes   Progress toward LDL goal: At goal  Hypertension   Last Blood Pressure: 139 / 84  (01/07/2010)   Serum creatinine: 1.06  (04/13/2009)   Serum potassium 4.3  (04/13/2009)    Hypertension flowsheet reviewed?: Yes   Progress toward BP goal: At goal  Self-Management Support :   Personal Goals (by the next clinic visit) :      Personal blood pressure goal: 140/90  (10/18/2009)     Personal LDL goal: 130  (10/18/2009)    Hypertension self-management support: Written self-care plan  (10/18/2009)    Lipid self-management support: Not documented     Lipid self-management support not done because: Good outcomes  (10/18/2009)

## 2010-12-12 NOTE — Miscellaneous (Signed)
Summary: medical record request    rec'd medical record request to go to: [employer] date sent:09/13/2010 Marily Memos  October 08, 2010 10:48 AM

## 2010-12-12 NOTE — Letter (Signed)
Summary: Generic Letter  [facility]  [address]   [place], [place] [address]   Phone: [phone]  Fax: [phone]    06/03/2010  [facility] [place] [address] [place], [place]  [address]  Dear Ms. Stanhope,  I'm happy to say all your blood tests were normal.  Have a good summer.  Sincerely,   Pearlean Brownie MD  Appended Document: Generic Letter mailed

## 2010-12-12 NOTE — Assessment & Plan Note (Signed)
Summary: back pain,df   Vital Signs:  Patient profile:   60 year old female Height:      65 inches Weight:      181 pounds BMI:     30.23 BSA:     1.90 Temp:     98.5 degrees F Pulse rate:   73 / minute BP sitting:   141 / 82  Vitals Entered By: Jone Baseman CMA (May 29, 2010 11:21 AM) CC: back and leg pain Is Patient Diabetic? No Pain Assessment Patient in pain? yes     Location: legs Intensity: 6   CC:  back and leg pain.  History of Present Illness: DEGENERATIVE DISC DISEASE, LUMBOSACRAL SPINE (ICD-722.52) having intermittent back spasms but otherwise overall better.  Is seeing a chiropracter.  No weakness or incontinence.  Would like a higher dose of muscle relaxer.    Palpitations Feels her heart flutter when she is sitting still or reading.  Never with exertion.  No chest pain or shortness of breath with exertion.  no change in her medications.  No lightheadedness when she does feel palitations  HYPERTENSION (ICD-401.9) taking medications regularly.  No chest pain or lightheadedness.  No edema  GAIT IMBALANCE (ICD-781.2) back to normal   ROS - as above PMH - Medications reviewed and updated in medication list.  Smoking Status noted in VS form     Habits & Providers  Alcohol-Tobacco-Diet     Tobacco Status: quit  Current Medications (verified): 1)  Lisinopril 40 Mg  Tabs (Lisinopril) .Marland Kitchen.. 1 Tab By Mouth Daily 2)  Prozac 20 Mg Caps (Fluoxetine Hcl) .... 3 Daily For Depression 3)  Adult Aspirin Low Strength 81 Mg  Chew (Aspirin) .Marland Kitchen.. 1 Tab By Mouth Daily With Food 4)  Flexeril 10 Mg  Tabs (Cyclobenzaprine Hcl) .Marland Kitchen.. 1 Tab By Mouth At Night When Have Spasms Do Not Take More Than 1-2 X A Week 5)  Triamterene-Hctz 37.5-25 Mg  Tabs (Triamterene-Hctz) .Marland Kitchen.. 1 Tab By Mouth Daily 6)  Trazodone Hcl 50 Mg Tabs (Trazodone Hcl) .... 1/2 By Mouth At Bedtime As Needed Insomnia 7)  Tramadol Hcl 50 Mg  Tabs (Tramadol Hcl) .Marland Kitchen.. 1 By Mouth Two Times A Day As Needed  Joint Pain 8)  Amlodipine Besylate 10 Mg  Tabs (Amlodipine Besylate) .... Once Daily For Blood Pressure  Allergies: 1)  ! Quinine  Social History: Smoking Status:  quit  Physical Exam  General:  Well-developed,well-nourished,in no acute distress; alert,appropriate and cooperative throughout examination Lungs:  Normal respiratory effort, chest expands symmetrically. Lungs are clear to auscultation, no crackles or wheezes. Heart:  Normal rate and regular rhythm. S1 and S2 normal without gallop, murmur, click, rub or other extra sounds. Msk:  Able to forward and backward bend without problems.  Able to walk on toes and heels well  No straight leg raise No focal pain on back exam    Impression & Recommendations:  Problem # 1:  DEGENERATIVE DISC DISEASE, LUMBOSACRAL SPINE (ICD-722.52)  intermittently flares.  Suggested using flexaril 10 mg at bedtime on as needed basis.  no red flags for cancer or infection   Orders: [facility]- Est  Level 4 (81191)  Problem # 2:  PALPITATIONS, OCCASIONAL (ICD-785.1)  sound benign with no red flags for coronary artery disease or malignant arrythmia.  Will monitor  Orders: [facility]- Est  Level 4 (47829)  Problem # 3:  HYPERTENSION (ICD-401.9) Stable .  check labs  Her updated medication list for this problem includes:  Lisinopril 40 Mg Tabs (Lisinopril) .Marland Kitchen... 1 tab by mouth daily    Triamterene-hctz 37.5-25 Mg Tabs (Triamterene-hctz) .Marland Kitchen... 1 tab by mouth daily    Amlodipine Besylate 10 Mg Tabs (Amlodipine besylate) ..... Once daily for blood pressure  Orders: T-Basic Metabolic Panel [id number]) [facility]- Est  Level 4 (99214)  BP today: 141/82 Prior BP: 139/84 (01/07/2010)  Labs Reviewed: K+: 4.3 (04/13/2009) Creat: : 1.06 (04/13/2009)   Chol: 186 (04/13/2009)   HDL: 31 (04/13/2009)   LDL: 129 (04/13/2009)   TG: 132 (04/13/2009)  Problem # 4:  GAIT IMBALANCE (ICD-781.2) resolved   Problem # 5:  RECTAL BLEEDING (ICD-569.3) resolved   Problem #  6:  HEPATITIS C (ICD-070.51) seeing hepatologist Dr Timothy Lasso. Had recent MRI of liver   Complete Medication List: 1)  Lisinopril 40 Mg Tabs (Lisinopril) .Marland Kitchen.. 1 tab by mouth daily 2)  Prozac 20 Mg Caps (Fluoxetine hcl) .... 3 daily for depression 3)  Adult Aspirin Low Strength 81 Mg Chew (Aspirin) .Marland Kitchen.. 1 tab by mouth daily with food 4)  Flexeril 10 Mg Tabs (Cyclobenzaprine hcl) .Marland Kitchen.. 1 tab by mouth at night when have spasms do not take more than 1-2 x a week 5)  Triamterene-hctz 37.5-25 Mg Tabs (Triamterene-hctz) .Marland Kitchen.. 1 tab by mouth daily 6)  Trazodone Hcl 50 Mg Tabs (Trazodone hcl) .... 1/2 by mouth at bedtime as needed insomnia 7)  Tramadol Hcl 50 Mg Tabs (Tramadol hcl) .Marland Kitchen.. 1 by mouth two times a day as needed joint pain 8)  Amlodipine Besylate 10 Mg Tabs (Amlodipine besylate) .... Once daily for blood pressure  Patient Instructions: 1)  Please schedule a follow-up appointment in 6 months to check blood pressure 2)  I will call you if your lab is abnormal otherwise I will send you a letter within 2 weeks. 3)  Keep up the exercise 4)  have fun in [place] Prescriptions: FLEXERIL 10 MG  TABS (CYCLOBENZAPRINE HCL) 1 tab by mouth at night when have spasms do not take more than 1-2 x a week  #30 x 1   Entered and Authorized by:   Pearlean Brownie MD   Signed by:   Pearlean Brownie MD on 05/29/2010   Method used:   Electronically to        [facility] [address] [phone]* (retail)       [address]       [place], [place]  [id number]       Ph: [phone] or [phone]       Fax: [phone]   RxID:   [id number]   Prevention & Chronic Care Immunizations   Influenza vaccine: Historical  (09/10/2009)    Tetanus booster: 11/07/2008: Tdap    Pneumococcal vaccine: Pneumovax (State)  (11/07/2008)  Colorectal Screening   Hemoccult: Not documented   Hemoccult action/deferral: Not indicated  (10/18/2009)    Colonoscopy: Dr. Barrett Shell:  7 mm polyp  transverse colon, anal fissure.  (12/07/2007)  Other Screening   Pap smear: Normal  (07/10/2008)   Pap smear action/deferral: Deferred-2 yr interval  (10/18/2009)    Mammogram: Not documented   Smoking status: quit  (05/29/2010)  Lipids   Total Cholesterol: 186  (04/13/2009)   LDL: 129  (04/13/2009)   LDL Direct: Not documented   HDL: 31  (04/13/2009)   Triglycerides: 132  (04/13/2009)    SGOT (AST): 199  (04/13/2009)   SGPT (ALT): 253  (04/13/2009)   Alkaline phosphatase: 119  (04/13/2009)  Total bilirubin: 0.5  (04/13/2009)  Hypertension   Last Blood Pressure: 141 / 82  (05/29/2010)   Serum creatinine: 1.06  (04/13/2009)   BMP action: Ordered   Serum potassium 4.3  (04/13/2009)    Hypertension flowsheet reviewed?: Yes   Progress toward BP goal: At goal  Self-Management Support :   Personal Goals (by the next clinic visit) :      Personal blood pressure goal: 140/90  (10/18/2009)     Personal LDL goal: 130  (10/18/2009)    Hypertension self-management support: Written self-care plan  (10/18/2009)    Lipid self-management support: Not documented     Lipid self-management support not done because: Good outcomes  (10/18/2009)   Prevention & Chronic Care Immunizations   Influenza vaccine: Historical  (09/10/2009)    Tetanus booster: 11/07/2008: Tdap    Pneumococcal vaccine: Pneumovax (State)  (11/07/2008)  Colorectal Screening   Hemoccult: Not documented   Hemoccult action/deferral: Not indicated  (10/18/2009)    Colonoscopy: Dr. Barrett Shell:  7 mm polyp transverse colon, anal fissure.  (12/07/2007)  Other Screening   Pap smear: Normal  (07/10/2008)   Pap smear action/deferral: Deferred-2 yr interval  (10/18/2009)    Mammogram: Not documented   Smoking status: quit  (05/29/2010)  Lipids   Total Cholesterol: 186  (04/13/2009)   LDL: 129  (04/13/2009)   LDL Direct: Not documented   HDL: 31  (04/13/2009)   Triglycerides: 132  (04/13/2009)    SGOT  (AST): 199  (04/13/2009)   SGPT (ALT): 253  (04/13/2009)   Alkaline phosphatase: 119  (04/13/2009)   Total bilirubin: 0.5  (04/13/2009)  Hypertension   Last Blood Pressure: 141 / 82  (05/29/2010)   Serum creatinine: 1.06  (04/13/2009)   BMP action: Ordered   Serum potassium 4.3  (04/13/2009)    Hypertension flowsheet reviewed?: Yes   Progress toward BP goal: At goal  Self-Management Support :   Personal Goals (by the next clinic visit) :      Personal blood pressure goal: 140/90  (10/18/2009)     Personal LDL goal: 130  (10/18/2009)    Hypertension self-management support: Written self-care plan  (10/18/2009)    Lipid self-management support: Not documented     Lipid self-management support not done because: Good outcomes  (10/18/2009)

## 2010-12-12 NOTE — Letter (Signed)
Summary: [facility]   Imported By: Arta Bruce 10/07/2010 16:25:13  _____________________________________________________________________  External Attachment:    Type:   Image     Comment:   External Document

## 2011-01-27 ENCOUNTER — Encounter: Payer: Self-pay | Admitting: Family Medicine

## 2011-01-27 ENCOUNTER — Other Ambulatory Visit: Payer: Self-pay | Admitting: Family Medicine

## 2011-01-27 ENCOUNTER — Ambulatory Visit (INDEPENDENT_AMBULATORY_CARE_PROVIDER_SITE_OTHER): Payer: No Typology Code available for payment source | Admitting: Family Medicine

## 2011-01-27 VITALS — BP 132/83 | HR 65 | Temp 98.0°F | Wt 196.8 lb

## 2011-01-27 DIAGNOSIS — I10 Essential (primary) hypertension: Secondary | ICD-10-CM

## 2011-01-27 DIAGNOSIS — M5137 Other intervertebral disc degeneration, lumbosacral region: Secondary | ICD-10-CM

## 2011-01-27 DIAGNOSIS — M5412 Radiculopathy, cervical region: Secondary | ICD-10-CM

## 2011-01-27 DIAGNOSIS — B1710 Acute hepatitis C without hepatic coma: Secondary | ICD-10-CM

## 2011-01-27 MED ORDER — GABAPENTIN 100 MG PO CAPS: 100.0000 mg | ORAL_CAPSULE | Freq: Every day | ORAL | Status: DC

## 2011-01-27 NOTE — Assessment & Plan Note (Signed)
Her Left hip pain seems to be a combination of hip jt pain and sciatica.   Will continue analgesics and substitute neurontin for her trazadone at night to see if will help with the radicular pain

## 2011-01-27 NOTE — Assessment & Plan Note (Signed)
Well controlled 

## 2011-01-27 NOTE — Progress Notes (Signed)
  Subjective:    Patient ID: Latoya Swanson, female    DOB: 17-Sep-1951, 60 y.o.   MRN: [id number]  HPI  Back Hip Pain Bilateral but worse on the Right.  Pain radiates from buttock down leg.   Also with hip movement.  No weakness or incontinence. Is better with rest.   No fevers.  Diclofenac and ultram help some  HYPERTENSION Disease Monitoring Blood pressure range-not checking Chest pain- no      Dyspnea- no Medications Compliance- daily  Lightheadedness- no   Edema- no  Hepatitis C Had been seen at Center in past but they are not returning her calls.  Her psychiatrist feels she is stable for interferon treatment.  No ruq pain or jaundice but does have chronically elevated LFTs  PMH - smoking status noted.  Is exercising  Review of Systems see HPI     Objective:   Physical Exam    Neck:  No deformities, thyromegaly, masses, or tenderness noted.   Full range of motion without pain. Right hip - pain with ROM mild SLR pain. Neurologic exam Cn 2-7 intact Strength  Able to walk on heels and shins.  Normal grip and upper extremity strength      Assessment & Plan:

## 2011-01-27 NOTE — Patient Instructions (Signed)
I will call the [facility] and see about an appointment and my nurse will call you with details Start neurontin 1 tablet at night, you can increase up to 3 at night.   Do not take with trazadone. Continue to work on losing weight - Congratulations!! Use the diclofenac, Tramadol as needed for pain

## 2011-01-27 NOTE — Assessment & Plan Note (Signed)
Has been seen at the [facility] by [facility] physicians.  I will call office to figure out her current status.   Seems medically stable for treatment if warranted

## 2011-01-27 NOTE — Telephone Encounter (Signed)
Refill request

## 2011-01-28 ENCOUNTER — Telehealth: Payer: Self-pay | Admitting: Family Medicine

## 2011-01-28 NOTE — Telephone Encounter (Signed)
[facility]  They are working on her chart now and will call her.  Spoke with Tobi Bastos

## 2011-01-30 ENCOUNTER — Other Ambulatory Visit: Payer: Self-pay | Admitting: Gastroenterology

## 2011-01-30 DIAGNOSIS — B182 Chronic viral hepatitis C: Secondary | ICD-10-CM

## 2011-02-03 LAB — CREATININE, SERUM
Creatinine, Ser: 0.66 mg/dL (ref 0.4–1.2)
GFR calc Af Amer: >60 mL/min (ref >60)

## 2011-02-05 ENCOUNTER — Ambulatory Visit (HOSPITAL_COMMUNITY)
Admission: RE | Admit: 2011-02-05 | Discharge: 2011-02-05 | Disposition: A | Discharge status: Home / Self Care | Payer: No Typology Code available for payment source | Source: Ambulatory Visit | Attending: Gastroenterology | Admitting: Gastroenterology

## 2011-02-05 DIAGNOSIS — K7460 Unspecified cirrhosis of liver: Secondary | ICD-10-CM | POA: Insufficient documentation

## 2011-02-05 DIAGNOSIS — B182 Chronic viral hepatitis C: Secondary | ICD-10-CM

## 2011-02-05 DIAGNOSIS — B1920 Unspecified viral hepatitis C without hepatic coma: Secondary | ICD-10-CM | POA: Insufficient documentation

## 2011-02-05 LAB — BUN: BUN: 12 mg/dL (ref 6–23)

## 2011-02-10 ENCOUNTER — Ambulatory Visit (HOSPITAL_COMMUNITY)
Admission: RE | Admit: 2011-02-10 | Discharge: 2011-02-10 | Disposition: A | Discharge status: Home / Self Care | Payer: No Typology Code available for payment source | Source: Ambulatory Visit | Attending: Gastroenterology | Admitting: Gastroenterology

## 2011-02-10 DIAGNOSIS — B1920 Unspecified viral hepatitis C without hepatic coma: Secondary | ICD-10-CM | POA: Insufficient documentation

## 2011-02-10 DIAGNOSIS — K7460 Unspecified cirrhosis of liver: Secondary | ICD-10-CM | POA: Insufficient documentation

## 2011-02-10 DIAGNOSIS — K7689 Other specified diseases of liver: Secondary | ICD-10-CM | POA: Insufficient documentation

## 2011-02-10 MED ORDER — GADOBENATE DIMEGLUMINE 529 MG/ML IV SOLN
18.0000 mL | Freq: Once | INTRAVENOUS | Status: AC
  Administered 2011-02-10: 18 mL via INTRAVENOUS

## 2011-02-14 LAB — GLUCOSE, CAPILLARY: Glucose-Capillary: 114 mg/dL — ABNORMAL HIGH (ref 70–99)

## 2011-02-25 LAB — HEMOCCULT GUIAC POC 1CARD (OFFICE): Fecal Occult Bld: POSITIVE

## 2011-02-25 LAB — CBC
Hemoglobin: 13.4 g/dL (ref 12.0–15.0)
RBC: 4.08 MIL/uL (ref 3.87–5.11)
WBC: 5.2 10*3/uL (ref 4.0–10.5)

## 2011-02-25 LAB — APTT: aPTT: 29 seconds (ref 24–37)

## 2011-02-25 LAB — POCT I-STAT, CHEM 8
HCT: 44 % (ref 36.0–46.0)
Hemoglobin: 15 g/dL (ref 12.0–15.0)
Potassium: 3.8 mEq/L (ref 3.5–5.1)
Sodium: 139 mEq/L (ref 135–145)

## 2011-02-25 LAB — PROTIME-INR: INR: 1.1 (ref 0.00–1.49)

## 2011-03-19 ENCOUNTER — Other Ambulatory Visit: Payer: Self-pay | Admitting: Family Medicine

## 2011-03-20 NOTE — Telephone Encounter (Signed)
Refill request

## 2011-03-25 NOTE — Op Note (Signed)
NAME:  Latoya Swanson, Latoya Swanson                  ACCOUNT NO.:  [id number]   MEDICAL RECORD NO.:  [id number]          PATIENT TYPE:  AMB   LOCATION:  DSC                          FACILITY:  [facility]   PHYSICIAN:  Cherylynn Ridges, M.D.    DATE OF BIRTH:  07/10/1951   DATE OF PROCEDURE:  03/27/2008  DATE OF DISCHARGE:                               OPERATIVE REPORT   PREOPERATIVE DIAGNOSIS:  Perianal pain with possible anal fissure.   POSTOPERATIVE DIAGNOSIS:  Posterior anal fissure.   PROCEDURES:  1. Examination of the perianal area and anus under anesthesia.  2. Rigid sigmoidoscopy.  3. Fissurectomy  4. Lateral sphincterotomy.   SURGEON:  Cherylynn Ridges, MD   ANESTHESIA:  General endotracheal.   ESTIMATED BLOOD LOSS:  Less than 50 mL.   COMPLICATIONS:  None.   CONDITION:  Stable.   FINDINGS:  The patient had a 2-cm posterior anal fissure with some  scarring, but fairly fresh and a mild hemorrhoidal disease.   INDICATIONS FOR OPERATION:  The patient is a 60 year old who has been  complaining with perianal and rectal pain, comes in now for exam under  anesthesia, unknown of what her problems are going to be because she was  too tender to examine in the office.   OPERATION:  The patient was taken to the operating room, placed on table  in supine position.  After an adequate general laryngeal airway  anesthetic was administered, she was prepped and draped, placed in  stirrups, in lithotomy position.  We started of with the rigid  sigmoidoscopy.   Using Surg-I-Loop on the rigid sigmoidoscope, we were able to do a  sigmoidoscopy up to approximately 20 cm from the anal verge.  This was  done with minimal difficulty and no lesions were noted.  The patient had  an adequate bowel prep for [place] to adequately visualize the mucosa.  As we  were coming out, we could see the patient had a posterior anal fissure.   We subsequently prepped and draped her and spread her cheeks apart using  tape.  After she  was prepped and draped in usual sterile manner, she was  placed in a more Trendelenburg position.  A working operative anoscope  was passed into the anal area, we were able to visualize the posterior  anal fissure very well.  There was a hemorrhoid superior to an internal  hemorrhoid just superior to the fissure, which was grabbed with an Allis  clamp.  A 3-0 chromic stitch was placed at its base and then we excised  that hemorrhoid along with some of the scar tissue around the fissure.  We curetted that area also to remove any scar tissue and then after we  had freshened up the area with Metzenbaums, we closed the mucosa using a  running locking stitch of 3-0 chromic.  Some figure-of-eight stitches  were used to obtain hemostasis subsequently.   We then adjusted the operative anoscope to the right lateral wall where  the mucosa was incised with a #15 blade.  Then, the internal sphincter  was noted  by palpation.  We were able to dissect out the internal  sphincter using a hemostat clamp and then transected using  electrocautery.  The mucosa was closed using a running locking stitch of  3-0 chromic.   Once the hemostasis was obtained and we inspected both sites, we then  placed a piece of Gelfoam with dibucaine on that into the perianal/anal  area.  A dressing was applied.  All counts were correct including  needles, sponges, and instruments.      Cherylynn Ridges, M.D.  Electronically Signed     JOW/MEDQ  D:  03/27/2008  T:  03/28/2008  Job:  [id number]

## 2011-03-28 NOTE — H&P (Signed)
NAMECHELCEA, Latoya Swanson                  ACCOUNT NO.:  [id number]   MEDICAL RECORD NO.:  [id number]          PATIENT TYPE:  INP   LOCATION:  1843                         FACILITY:  [facility]   PHYSICIAN:  Alanda Amass, M.D.   DATE OF BIRTH:  September 14, 1951   DATE OF ADMISSION:  03/05/2007  DATE OF DISCHARGE:                              HISTORY & PHYSICAL   CHIEF COMPLAINT:  Chest pain.   HISTORY OF PRESENT ILLNESS:  Patient is a 60 year old African American  female with a history of hypertension, mitral valve prolapse,  depression, hepatitis C who complains of chest pain.  For the past week,  she had been feeling very bad, feeling dizzy, lightheaded, with also  some chest discomfort, but not really pain.  Today and yesterday, she  has been feeling terribly lousy.  She said that she started having real  pain in her chest yesterday, that also was in her back as well.  She had  2-3 episodes of chest pain yesterday.  The chest pain is associated with  shortness of breath, dizziness and nausea, as well as diaphoresis.  She  notices that the chest pain often will worsen when she is walking;  however, it has occurred at night while she was asleep as well.  Last  night, she woke up out of her sleep because of the pain.  This chest  pain does seem to get better after she lays down if she is exerting  herself.  Also the pain is associated with pain in her jaw.  She has  been using 2-3 pillows to sleep and waking up short of breath recently.  Denies any leg swelling.  Has had palpitations off and on for a while  now.  Also complaining of headache she has had for most of the week.  Does not report any melena or bright red blood per rectum.  No weakness  on either side of her body.  She had noticed that her chest is tender  when she presses on it.  She went urgent care center today, but was sent  to the emergency department due to her chest pain and put on a nitro  drip.   PAST MEDICAL HISTORY:  1.  Mitral valve prolapse.  2. Depression.  3. History of hepatitis C.  4. Hypertension.  5. History of bleeding ulcer in the upper abdomen.   ALLERGIES:  ASPIRIN AND IBUPROFEN CAUSED BLEEDING ULCERS.   MEDICATIONS:  1. Prozac 40 mg daily.  2. Seroquel 25 mg q.h.s., we believe this is the right dose, but we      not completely sure.  3. Lopressor.  4. HCTZ.   FAMILY HISTORY:  Dad died at 6 years old of an MI.  Aunt died in her  early 39s of an MI.  Sister is 70 and has CHF.  Also hypertension runs  in the family.  There are no thyroid problems and no diabetes.   SOCIAL HISTORY:  Patient lives in [place] with a friend.  She smoked  10 years off and on, 1 pack every, would last her 2-3  days.  She does  not smoke any now.  She is a social drinker.  In the 60s and 70s she did  use some illegal drugs, but denies using any.  She is unemployed  currently.   VITAL SIGNS:  Temperature 98.0.  Pulse 99.  Respiratory rate 14.  Blood  pressure 219/107.  Saturating 100% on room air.  IN GENERAL:  She is alert and in no acute distress.  HEENT:  Extraocular muscles intact.  Light scleral icterus.  Pupils  equal, round and reactive to light.  Oropharynx without exudates or  erythema.  NECK:  No  lymphadenopathy.  CHEST:  Increased chest discomfort when pressed on left side of chest.  CARDIOVASCULAR:  Patient has some JVD.  HEART:  Bradycardic.  No murmurs, rubs or gallops.  Plus 2 radial and  pedal pulses.  PULMONARY:  Mild crackles on inspiration up to the middle of the  posterior lung fields.  Good air movement.  No retractions.  ABDOMEN:  Soft, nondistended.  Tender in epigastric region.  Obese.  No  hepatosplenomegaly appreciated.  EXTREMITIES:  No clubbing, cyanosis or edema.  NEURO:  Cranial nerves II-XII grossly intact.  Strength and sensitivity  within normal.   EKG showed 51 beats per minute, bradycardic, normal sinus rhythm.  Converted to wave in V1, flattened T wave in V2.   Normal axis.  Good R  wave progression.  Chest x-ray showed no acute abnormality.  Prominent  left ventricular contour associated with hypertension.  Point of care  cardiac enzymes were negative x1.  The first real set of cardiac enzymes  were negative with a troponin of 0.01.  CBC showed a white blood cell  count of 5.6, hemoglobin 14.9, hematocrit 43.3, platelets 180,  neutrophil 50, lymphocytes 40% were normal, MCV at 95.1.  BNP of 54.  INR 1.0.  CMET:  Sodium of 139, potassium 3.5, chloride 106, bicarb 24,  BUN 5, creatinine 0.62, glucose 76, total bili 0.9, ALP of 77, AST high  at 258, ALT high at 304, total protein 7.5, albumin 4.0, calcium 9.4.   ASSESSMENT/PLAN:  Patient is a 60 year old African American female with  a history of hypertension, mitral valve prolapse and depression here  with chest pain to rule out myocardial infarction.   1. Chest pain.  Due to new onset of chest pain, we will get cardiac      enzymes x3.  EKG in the morning.  TSH to rule out myocardial      infarction.  Hypertension, uncontrolled and nadir may be causing      her ischemia.  Also could be precipitated congestive heart failure      since we did notice some increased JVD, crackles and increased      vascular markings on the right side on her chest x-ray.  However,      her BNP is normal.  We will treat her hypertension aggressively      just in case this is causing some ischemia.  We will also give      Protonix in case there is reflux components to the pain and she is      complaining of nausea and vomiting and has epigastric pain on      palpation.  We will check a fasting lipid panel in the morning.      Rule out thus far is negative per cardiac enzymes and EKG.  If      complaining of chest pain, the nurse  to call house officer to ask      for sublingual nitroglycerin.  2. Hypertension.  Uncontrolled.  Patient said that she was taking her     medications and we not give her Lopressor due to her  low heart      rate.  We will give hydrochlorothiazide 25 mg and Captopril 25 mg 3      times a day with both of these medications giving the first dose      now.  Once her systolic blood pressure is less than 160, we will      titrate down off the nitro drip.  We will check her blood pressure      every 4 hours.  3. Depression.  Continue Prozac and Seroquel.  Not completely sure of      Seroquel dose, patient said it helps with her sleep, so we wrote      for the lowest dose possible at 25 mg q.h.s.  4. History of hepatitis C.  Patient does have elevated liver function      tests, which is likely related to this.  She does have some      epigastric tenderness and slight scleral icterus, but her total      bili and ALP were not elevated, which would suggest against      gallbladder issues.  We will follow these labs.  5. Headache.  We will wean her nitro drip as this is probably the      cause of a lot of her headache issues.  We will treat her      hypertension as well, which may also help and give her Tylenol for      pain p.r.n.  6. Prophylaxis.  We will place sequential compression device and give      Protonix for gastrointestinal prophylaxis.           ______________________________  Alanda Amass, M.D.     JH/MEDQ  D:  03/06/2007  T:  03/06/2007  Job:  [id number]

## 2011-03-28 NOTE — Op Note (Signed)
[facility]  Patient:    Latoya Swanson, Latoya Swanson Visit Number: [id number] MRN: [id number]          Service Type: DSU Location: [facility] Attending Physician:  Ronne Binning Dictated by:   Nicki Reaper, M.D. Proc. Date: 02/01/02 Admit Date:  02/01/2002   CC:         Nicki Reaper, M.D.   Operative Report  PREOPERATIVE DIAGNOSIS:  Carpal tunnel syndrome right hand.  POSTOPERATIVE DIAGNOSIS:  Carpal tunnel syndrome right hand.  OPERATION:  Decompression right median nerve.  SURGEON:  Nicki Reaper, M.D.  ANESTHESIA:  General.  ANESTHESIOLOGIST:  Halford Decamp, M.D.  HISTORY:  The patient is a 60 year old female with a history of carpal tunnel syndrome with EMG, nerve conductions positive which have not responded to conservative treatment.  PROCEDURE:  The patient was brought to the operating room where a general anesthesia was carried out without difficulty.  A forearm tourniquet was placed after prepping and draping with DuraPrep.  In the supine position, a longitudinal incision was made and exsanguination was performed with an Esmarch bandage.  The tourniquet was inflated to 250 mmHg.  The longitudinal incision was deepened and carried down through the subcutaneous tissues. Bleeders were electrocauterized.  Palmar fascia was split.  The superficial palmar arch was identified.  The flexor tendons of the ring and little fingers identified to the ulnar side of the median nerve and carpal retinaculum was incised with sharp dissection.  A right angle and Sewall retractor were placed between the skin and forearm fascia.  The fascia was released for approximately 3 cm proximal to the wrist crease under direct vision.  The canal was explored.  The tenosynovial tissue was moderately thickened.  No further lesions were identified.  The wound was irrigated, skin closed with interrupted 5-0 Nylon sutures.  Sterile compressive dressing and splint was  applied.  The patient tolerated the procedure well and was taken to the recovery room for observation in satisfactory condition.  She is discharged home to return to the [facility] in one week on Vicodin and Keflex. Dictated by:   Nicki Reaper, M.D. Attending Physician:  Ronne Binning DD:  02/01/02 TD:  02/01/02 Job: [id number]

## 2011-03-28 NOTE — Discharge Summary (Signed)
NAMEJENTRI, Latoya Swanson                  ACCOUNT NO.:  [id number]   MEDICAL RECORD NO.:  [id number]          PATIENT TYPE:  INP   LOCATION:  5524                         FACILITY:  [facility]   PHYSICIAN:  Leighton Roach McDiarmid, M.D.DATE OF BIRTH:  Jun 25, 1951   DATE OF ADMISSION:  03/05/2007  DATE OF DISCHARGE:                               DISCHARGE SUMMARY   DISCHARGE DIAGNOSES:  1. Chest pain.  2. Hypertension.  3. Hyperlipidemia.  4. Hepatitis C.   DISCHARGE MEDICATIONS:  1. Lisinopril 10 mg 1 p.o. daily.  2. Hydrochlorothiazide 25 mg 1 p.o. daily.  3. Zantac 150 mg 1 p.o. b.i.d. for 4 weeks.  4. Prozac 40 mg p.o. daily.  5. Seroquel 25 mg p.o. q.h.s.   FOLLOWUP:  The patient is to call [facility] and schedule a followup  appointment.  She needs a repeat BMET in 2 weeks to check her creatinine  and potassium because of the ACE inhibitor.  She also needs an  outpatient ischemic workup.  We suggest an exercise treadmill, but this  is left to the discretion of the primary doctor at [facility].   PROCEDURES/DIAGNOSTIC STUDIES:  1. Chest x-ray showing no acute abnormality.  She does have a      prominent left ventricular contour.  This is potentially associated      with hypertension.  2. A 12-lead EKG has a rate of 51 with a sinus bradycardia.  She has a      normal axis and no ST changes.  No LVH criteria met.   CONSULTANTS:  None.   ADMISSION HISTORY AND PHYSICAL:  The patient is a pleasant 60 year old  African-American female with a history of uncontrolled high blood  pressure and increasing chest pain associated with nausea and vomiting  and diaphoresis, who proceeded to urgent care given her history and her  greatly elevated blood pressure.  She was at [facility] ED for  management and admission.  We were called to admit.   HOSPITAL COURSE:  1. Chest pain:  The patient was ruled out with serial cardiac enzymes      and EKGs.  It is felt that her chest pain was related to  heart      burn.  She does have a history of peptic ulcer disease years ago      requiring treatment for H. pylori, and she does endorse a history      of burning, acidic, foul-tasting liquid bubbling up at the back of      her throat.  This occurs when her symptoms are worst.  She was      treated with a proton pump inhibitor during the hospitalization      with good results, and she is being discharged on H2-blockade.  She      is appropriate for outpatient followup.  2. Hypertension:  The patient had greatly elevated blood pressures      approaching systolic 200.  On admission, she was treated with      hydrochlorothiazide and short-acting ACE.  This was converted over  to prolonged-acting agent with lisinopril.  She was tolerating all      this without orthostatic problems, and she will be discharged on      this.  She is not being started on aspirin now.  This is left to      the discretion of the primary because of her treatment for GERD.  3. Hyperlipidemia:  Lipids were checked during this hospitalization.      Statin therapy was not initiated because the patient's increased      LFTs.  See problem number next below.  Her total cholesterol was      212.  Triglycerides are 81.  HDL 39, and LDL was 157.  4. Hepatitis C:  The patient has a history of hepatitis C for several      years.  She has not had recent followup for this, or she just      thinks she did this as an outpatient.  Her AST was elevated at 258,      and her ALT was 304; however, her bilirubin was normal, as was her      INR.  Her platelets were also normal at 180.  She says she has been      vaccinated for hepatitis B.  5. Depression.  The patient has continued on her home dose of Prozac      and Seroquel without complication.   OTHER LABORATORY RESULTS:  The patient had a BNP of 54.  Her creatinine  was 0.62 with a potassium of 3.5.  Her TSH was 1.2.   We appreciate the physicians at [facility] who came for  this patient.      Dwana Curd Para March, M.D.    ______________________________  Leighton Roach McDiarmid, M.D.    GSD/MEDQ  D:  03/06/2007  T:  03/06/2007  Job:  [id number]   cc:   Primary physician at [facility]

## 2011-03-28 NOTE — Op Note (Signed)
[place]  Patient:    Latoya Swanson, Latoya Swanson                           MRN: [id number] Proc. Date: 02/02/01 Adm. Date:  [id number] Attending:  Marcene Corning                           Operative Report  PREOPERATIVE DIAGNOSIS:  Right shoulder impingement.  POSTOPERATIVE DIAGNOSIS: 1. Right shoulder impingement. 2. Right shoulder partial rotator cuff and biceps tears.  OPERATION PERFORMED: 1. Right shoulder acromioplasty. 2. Right shoulder arthroscopic debridement.  ANESTHESIA:  General.  ATTENDING SURGEON:  Lubertha Basque. Jerl Santos, M.D.  ASSISTANT:  Lindwood Qua, P.A.  INDICATIONS FOR PROCEDURE:  The patient is a 60 year old woman with a Ground history of right shoulder pain after a work-related injury.  This has persisted despite oral anti-inflammatories, activity restriction and injection which did afford her transient relief.  She underwent a preoperative MRI scan which showed some significant impingement, but no evidence of a full-thickness rotator cuff tear.  At this point she was offered operative intervention to consist of an arthroscopy.  The procedure was discussed with the patient and informed operative consent was obtained after discussion of possible complications of reaction to anesthesia and infection.  DESCRIPTION OF PROCEDURE:  The patient was taken to an operating suite where general anesthetic was applied without difficulty.  She was then positioned in beach chair position and prepped and draped in normal sterile fashion.  After administration of preop intravenous antibiotics, an arthroscopy of the right shoulder was performed through a total of three portals.  The glenohumeral joint showed no degenerative change.  She did have a partial tear of the biceps tendon though the tendon was well attached at the superior labrum.  A thorough debridement was performed and the tendon was left at least 80% intact.  She also had some significant  partial thickness tearing of the rotator cuff seen from below that also required a debridement.  From above, the cuff was not torn but was irrigated.  She had a prominent subacromial spur which was addressed with arthroscopic decompression back to a flat surface. This was done with the bur in the lateral position followed by transfer of the bur to the posterior position.  The [facility] joint was not addressed and does not appear to be impinging the rotator cuff significantly.  The shoulder was thoroughly irrigated followed by placement of Marcaine with epinephrine and morphine.  Simple sutures of nylon were used to reapproximate the portals loosely followed by Adaptic and a dry gauze dressing with tape.  Estimated blood loss and intraoperative fluids can be obtained from Anesthesia records.  DISPOSITION:  The patient was extubated in the operating room and taken to the recovery room in stable condition.  Plans were for her to go home the same day and to follow up in the office in less than a week.  I will contact her by phone tonight. DD:  02/02/01 TD:  02/03/01 Job: [id number]

## 2011-03-28 NOTE — Op Note (Signed)
NAME:  Latoya Swanson, Latoya Swanson                              ACCOUNT NO.:  [id number]   MEDICAL RECORD NO.:  [id number]                   PATIENT TYPE:  AMB   LOCATION:  DSC                                  FACILITY:  [facility]   PHYSICIAN:  Lubertha Basque. Jerl Santos, M.D.             DATE OF BIRTH:  12-22-1950   DATE OF PROCEDURE:  07/04/2003  DATE OF DISCHARGE:                                 OPERATIVE REPORT   PREOPERATIVE DIAGNOSIS:  1. Right shoulder biceps tendonopathy.  2. Right shoulder acromioclavicular pain.   POSTOPERATIVE DIAGNOSIS:  1. Right shoulder biceps tendonopathy.  2. Right shoulder acromioclavicular pain.   PROCEDURE:  1. Right shoulder arthroscopic AC resection.  2. Right shoulder arthroscopic biceps tenotomy.  3. Right shoulder arthroscopic acromioplasty.   ANESTHESIA:  General and block.   SURGEON:  Lubertha Basque. Jerl Santos, M.D.   ASSISTANT:  Lindwood Qua, P.A.   INDICATIONS FOR PROCEDURE:  The patient is a 60 year old woman several years  out from a right shoulder arthroscopy at which point an acromioplasty was  performed. She developed a pain syndrome postoperatively and went through  nerve blocks and other  interventions through a pain specialist  who  addressed this problem. That has subsequently resolved. Unfortunately she  has been left with pain across the anterior aspect of her shoulder. This  extends from the  acromioclavicular joint down the anterior aspect of the  arm. She has failed several injections and has been through all appropriate  therapy. She has been left with pain at rest and pain with activity. At this  point she is offered an operation.   The planned procedure is for an arthroscopy. I discussed the probability of  a biceps tendon release being done, and the fact that she would have a lump  above her elbow as a result. I reviewed this several times with her and she  understood well. Informed operative consent was obtained after discussion of  this  possibility along with the possible complications of reaction to  anesthesia and infection.   DESCRIPTION OF PROCEDURE:  The patient was taken to the operating suite were  general anesthesia was applied without difficulty. She was also given a  block in the preanesthesia area. She was placed in the beachchair position  and prepped and draped in the normal sterile fashion.   After the administration of preoperative IV antibiotics an arthroscopy of  the right shoulder was performed through a total of 3 portals. The  glenohumeral joint showed no degenerative change. The biceps tendon was  quite degenerative and was  narrowed to about 50% of its normal width at it  exited through the rotator cuff. This was released.  Debridement of this stump was taken back to the top of the glenoid.   The rotator cuff appeared intact from below otherwise. In the subacromial  space she had a great deal of bursitis addressed  with a thoroughly  bursectomy. The rotator cuff appeared  to be intact throughout. A brief  revision acromioplasty was done followed  by resection of the  acromioclavicular joint through the additional anterior portal. She did have  bone-on-bone contact at this area.   The shoulder was thoroughly irrigated at the end of the case followed  by  placement  of Marcaine with epinephrine. Simple sutures of nylon were used  to loosely reapproximate the portals followed  by Adaptic and dry gauze  dressing  with tape. Estimated blood loss and interoperative fluids can be  obtained from the anesthesia records.   The patient was extubated in the operating room and taken to the recovery  room in stable condition. Plans were for her to go home the same day and to  follow up in the office in one week. I will contact her by phone tonight.                                               Lubertha Basque Jerl Santos, M.D.    PGD/MEDQ  D:  07/04/2003  T:  07/04/2003  Job:  [id number]

## 2011-05-19 ENCOUNTER — Other Ambulatory Visit: Payer: Self-pay | Admitting: Family Medicine

## 2011-05-19 ENCOUNTER — Ambulatory Visit (INDEPENDENT_AMBULATORY_CARE_PROVIDER_SITE_OTHER): Payer: No Typology Code available for payment source | Admitting: Family Medicine

## 2011-05-19 ENCOUNTER — Encounter: Payer: Self-pay | Admitting: Family Medicine

## 2011-05-19 VITALS — BP 133/79 | HR 76 | Temp 98.0°F | Wt 191.0 lb

## 2011-05-19 DIAGNOSIS — F39 Unspecified mood [affective] disorder: Secondary | ICD-10-CM | POA: Insufficient documentation

## 2011-05-19 DIAGNOSIS — B1710 Acute hepatitis C without hepatic coma: Secondary | ICD-10-CM

## 2011-05-19 DIAGNOSIS — M19019 Primary osteoarthritis, unspecified shoulder: Secondary | ICD-10-CM

## 2011-05-19 DIAGNOSIS — G4700 Insomnia, unspecified: Secondary | ICD-10-CM | POA: Insufficient documentation

## 2011-05-19 DIAGNOSIS — I10 Essential (primary) hypertension: Secondary | ICD-10-CM

## 2011-05-19 DIAGNOSIS — K219 Gastro-esophageal reflux disease without esophagitis: Secondary | ICD-10-CM

## 2011-05-19 MED ORDER — OMEPRAZOLE 20 MG PO CPDR: 20.0000 mg | DELAYED_RELEASE_CAPSULE | Freq: Every day | ORAL | Status: DC | PRN

## 2011-05-19 MED ORDER — GABAPENTIN 300 MG PO CAPS: 300.0000 mg | ORAL_CAPSULE | Freq: Every day | ORAL | Status: DC

## 2011-05-19 NOTE — Assessment & Plan Note (Signed)
Having a mild flare.  Refill omeprazole

## 2011-05-19 NOTE — Progress Notes (Signed)
  Subjective:    Patient ID: Latoya Swanson, female    DOB: 1950/12/26, 60 y.o.   MRN: [id number]  HPI  HYPERTENSION Disease Monitoring Blood pressure range-not checking Chest pain- no      Dyspnea- no Medications Compliance- daily norvasc and Maxide Lightheadedness- no   Edema- no  Insomnia Trouble getting to and maintaining sleep.   Wakes 4 times per night.  Feels tired the next day.  Feels is mostly due to pain in her shoulder and neck.  Feels is making her depression worse but not the cause.  No signs of suicidal ideation  Shoulder Pain Worsening pain in R shoulder.  Hurts when lies on it and when moves it.  No loss of strength distally.  No recent trauma.  No joint redness or fever  Overweight Continues to work on weight loss.    Review of Symptoms - see HPI  PMH - Had R shoulder surgery by Dr Margreta Journey in the past years ago      Review of Systems     Objective:   Physical Exam    R Shoulder - Decreased ROM in all axises.   Tender to palpation diffusely.   No redness.  Distal strength is normal      Assessment & Plan:

## 2011-05-19 NOTE — Patient Instructions (Signed)
Take one gabapentin 300 mg tablet at night.  If not helping after one week then take two   Will refer your to Physical therapy for the shoulder  Pap smear next visit in one month

## 2011-05-19 NOTE — Assessment & Plan Note (Signed)
Lab Results  Component Value Date   NA 140 08/28/2010   K 3.9 08/28/2010   CL 106 08/28/2010   CO2 25 08/28/2010   BUN 12 02/05/2011   CREATININE 0.63 02/05/2011    BP Readings from Last 3 Encounters:  05/19/11 133/79  01/27/11 132/83  10/28/10 135/85    Assessment: Hypertension control:  controlled  Progress toward goals:  at goal   Plan: Hypertension treatment:  continue current medications and weight loss

## 2011-05-19 NOTE — Assessment & Plan Note (Signed)
R shoulder worsened pain diffusely with limited ROM.  Will refer to PT

## 2011-05-19 NOTE — Assessment & Plan Note (Signed)
New. Seems most related to pain.  Will increase gabapentin and work on helping her shoulder pain.  Investigate sleep apnea possibility if not improving although she is losing weight which should help

## 2011-05-20 NOTE — Telephone Encounter (Signed)
Refill request

## 2011-06-05 ENCOUNTER — Ambulatory Visit (INDEPENDENT_AMBULATORY_CARE_PROVIDER_SITE_OTHER): Payer: No Typology Code available for payment source | Admitting: Family Medicine

## 2011-06-05 ENCOUNTER — Encounter: Payer: Self-pay | Admitting: Family Medicine

## 2011-06-05 VITALS — BP 123/91 | HR 64 | Temp 98.1°F | Ht 65.0 in | Wt 194.3 lb

## 2011-06-05 DIAGNOSIS — M75100 Unspecified rotator cuff tear or rupture of unspecified shoulder, not specified as traumatic: Secondary | ICD-10-CM

## 2011-06-05 DIAGNOSIS — M7550 Bursitis of unspecified shoulder: Secondary | ICD-10-CM

## 2011-06-05 DIAGNOSIS — IMO0002 Reserved for concepts with insufficient information to code with codable children: Secondary | ICD-10-CM

## 2011-06-05 NOTE — Assessment & Plan Note (Signed)
Diagnosis based on positive impingement signs and improvement of this symptoms following lidocaine injection. Treatment with 40mg  of kenolog injected into the same space. 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  Plan: to follow up with PCP in 1-2 weeks for repeat exam. If still very limited ROM may consider refer to sports medicine for treatment of adhesive capsulitis with glenohumeral injections and PT.  Red flags reviewed.

## 2011-06-05 NOTE — Patient Instructions (Signed)
Thank you for coming in today. I think you have bursitis.  The shot will kick in tomorrow, so tonight you may have more pain.  Let me know if you have fevers or chills or the pain gets way worse.  See Dr. Deirdre Priest in a week or so about your neck and shoulder overall. You may have a frozen shoulder which may benefit from special therapy.

## 2011-06-05 NOTE — Progress Notes (Signed)
Right shoulder pain worsening for about 6 weeks. Worsened again over the last few days. Pain is worse with ROM of the shoulder and when she lays on it at night. Denies any radiculopathy. No hand or arm weakness or numbness.  Has had a shoulder injection in the past. 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 No fevers or chills.   Exam:  BP 123/91  Pulse 64  Temp(Src) 98.1 F (36.7 C) (Oral)  Ht 5\' 5"  (1.651 m)  Wt 194 lb 4.8 oz (88.134 kg)  BMI 32.33 kg/m2 Gen: Well NAD  Right Shoulder: Non tender over AC ROM:  ABD: 85 deg FF:    100 deg INT:    85 EXT    85 Hawk[place] Very positive Empty can positive  Subacromial injection:   Consent obtained and timeout performed. Landmarks palpated and marked with indention.  Area cleaned with alcohol and cold spray applied. 3ml of lidocaine was placed into the subacromial bursa with a 23 gauge 1.5 inch needle.. Following injection shoulder pain was much reduced. Then  Area cleaned with alcohol and 40mg  kenalog and 1ml of 1% lidocaine was again placed into the subacromial bursa with a 23 gauge 1.5 inch needle. No bleeding. Patient tolerated the procedure well.

## 2011-06-23 ENCOUNTER — Other Ambulatory Visit (HOSPITAL_COMMUNITY)
Admission: RE | Admit: 2011-06-23 | Discharge: 2011-06-23 | Disposition: A | Discharge status: Home / Self Care | Payer: No Typology Code available for payment source | Source: Ambulatory Visit | Attending: Family Medicine | Admitting: Family Medicine

## 2011-06-23 ENCOUNTER — Ambulatory Visit (INDEPENDENT_AMBULATORY_CARE_PROVIDER_SITE_OTHER): Payer: No Typology Code available for payment source | Admitting: Family Medicine

## 2011-06-23 ENCOUNTER — Encounter: Payer: Self-pay | Admitting: Family Medicine

## 2011-06-23 VITALS — BP 150/88 | HR 76 | Temp 98.0°F | Wt 194.5 lb

## 2011-06-23 DIAGNOSIS — Z01419 Encounter for gynecological examination (general) (routine) without abnormal findings: Secondary | ICD-10-CM | POA: Insufficient documentation

## 2011-06-23 DIAGNOSIS — Z124 Encounter for screening for malignant neoplasm of cervix: Secondary | ICD-10-CM

## 2011-06-23 DIAGNOSIS — N644 Mastodynia: Secondary | ICD-10-CM | POA: Insufficient documentation

## 2011-06-23 DIAGNOSIS — M19019 Primary osteoarthritis, unspecified shoulder: Secondary | ICD-10-CM

## 2011-06-23 DIAGNOSIS — R102 Pelvic and perineal pain: Secondary | ICD-10-CM

## 2011-06-23 DIAGNOSIS — R079 Chest pain, unspecified: Secondary | ICD-10-CM

## 2011-06-23 DIAGNOSIS — N949 Unspecified condition associated with female genital organs and menstrual cycle: Secondary | ICD-10-CM

## 2011-06-23 NOTE — Assessment & Plan Note (Signed)
Pain some better but no change in ROM with injection.  She never went to PT.  Has marked decreased ROM.  Reviewed exercises and necessity for PT to improve ROM.

## 2011-06-23 NOTE — Patient Instructions (Addendum)
We will refer you to PT to improve your shoulder motion.  Try to do your motion exercises every day  You chest pain seem related to your muscles and bones.  If it gets worse or happens with exertion call us  I will call you with the results of your pelvic US  Check your blood pressure regularly.  Call if it is usually > 150/90

## 2011-06-23 NOTE — Progress Notes (Signed)
  Subjective:    Patient ID: Latoya Swanson, female    DOB: 1950-12-30, 60 y.o.   MRN: [id number]  HPI  Chest Pain Continues on and off.  Achy pain around both clavicles.  Worse with shoulder movement.  No shortness of breath or pain with exertion not involving shoulders.  Gets better with diclofenac.    Pelvic Pain Usually with sex but is tender to touch.  On and off for 4-6 months.  Sometimes small amount of bleeding with sex but no discharge or any menstrual type bleeding.  Last Pap 2 years ago no history of abnormal paps.  No change in stomach girth.  No limb swelling.  No GI symptoms of diarrhea or nausea or vomiting  Review of Symptoms - see HPI  PMH - Smoking status noted.    Review of Systems     Objective:   Physical Exam  Heart - Regular rate and rhythm.  No murmurs, gallops or rubs.    Lungs:  Normal respiratory effort, chest expands symmetrically. Lungs are clear to auscultation, no crackles or wheezes. Abdomen:  Upper abdomen soft and non-tender without masses, organomegaly or hernias noted.  No guarding or rebound.  Suprapubic tenderness mostly on the Left. Genitalia:  Normal introitus for age, no external lesions, no vaginal discharge, mucosa pink and moist, no vaginal or cervical lesions, no vaginal atrophy, no friaility or hemorrhage, Unable to palpate uterus or ovaries due to habitus abdominal scan and habitus.  No CMT         Assessment & Plan:

## 2011-06-23 NOTE — Assessment & Plan Note (Signed)
Musculoskeletal without any signs of cad.  Continue nsaid.

## 2011-06-23 NOTE — Assessment & Plan Note (Signed)
No cause seen on vaginal exam.  Not likely PID.  Will check [place] looking for mass lesion

## 2011-06-24 LAB — GC/CHLAMYDIA PROBE AMP, GENITAL
Chlamydia, DNA Probe: NEGATIVE
GC Probe Amp, Genital: NEGATIVE

## 2011-06-27 ENCOUNTER — Ambulatory Visit (HOSPITAL_COMMUNITY)
Admission: RE | Admit: 2011-06-27 | Discharge: 2011-06-27 | Disposition: A | Discharge status: Home / Self Care | Payer: No Typology Code available for payment source | Source: Ambulatory Visit | Attending: Family Medicine | Admitting: Family Medicine

## 2011-06-27 DIAGNOSIS — N949 Unspecified condition associated with female genital organs and menstrual cycle: Secondary | ICD-10-CM | POA: Insufficient documentation

## 2011-06-27 DIAGNOSIS — R102 Pelvic and perineal pain: Secondary | ICD-10-CM

## 2011-06-27 DIAGNOSIS — Z780 Asymptomatic menopausal state: Secondary | ICD-10-CM | POA: Insufficient documentation

## 2011-06-27 DIAGNOSIS — D250 Submucous leiomyoma of uterus: Secondary | ICD-10-CM | POA: Insufficient documentation

## 2011-07-02 ENCOUNTER — Ambulatory Visit: Payer: No Typology Code available for payment source | Admitting: Physical Therapy

## 2011-07-15 ENCOUNTER — Ambulatory Visit: Payer: No Typology Code available for payment source | Attending: Family Medicine

## 2011-07-15 DIAGNOSIS — M542 Cervicalgia: Secondary | ICD-10-CM | POA: Insufficient documentation

## 2011-07-15 DIAGNOSIS — M25619 Stiffness of unspecified shoulder, not elsewhere classified: Secondary | ICD-10-CM | POA: Insufficient documentation

## 2011-07-15 DIAGNOSIS — M25519 Pain in unspecified shoulder: Secondary | ICD-10-CM | POA: Insufficient documentation

## 2011-07-15 DIAGNOSIS — IMO0001 Reserved for inherently not codable concepts without codable children: Secondary | ICD-10-CM | POA: Insufficient documentation

## 2011-07-21 ENCOUNTER — Encounter: Payer: No Typology Code available for payment source | Admitting: Physical Therapy

## 2011-07-24 ENCOUNTER — Ambulatory Visit: Payer: No Typology Code available for payment source | Admitting: Physical Therapy

## 2011-07-28 ENCOUNTER — Ambulatory Visit: Payer: No Typology Code available for payment source | Admitting: Family Medicine

## 2011-07-29 ENCOUNTER — Ambulatory Visit: Payer: No Typology Code available for payment source

## 2011-08-04 ENCOUNTER — Ambulatory Visit (INDEPENDENT_AMBULATORY_CARE_PROVIDER_SITE_OTHER): Payer: No Typology Code available for payment source | Admitting: Family Medicine

## 2011-08-04 ENCOUNTER — Encounter: Payer: Self-pay | Admitting: Family Medicine

## 2011-08-04 VITALS — BP 136/82 | HR 74 | Temp 98.4°F | Ht 65.0 in | Wt 193.9 lb

## 2011-08-04 DIAGNOSIS — R079 Chest pain, unspecified: Secondary | ICD-10-CM

## 2011-08-04 DIAGNOSIS — M19019 Primary osteoarthritis, unspecified shoulder: Secondary | ICD-10-CM

## 2011-08-04 DIAGNOSIS — I10 Essential (primary) hypertension: Secondary | ICD-10-CM

## 2011-08-04 DIAGNOSIS — N949 Unspecified condition associated with female genital organs and menstrual cycle: Secondary | ICD-10-CM

## 2011-08-04 DIAGNOSIS — R102 Pelvic and perineal pain: Secondary | ICD-10-CM

## 2011-08-04 NOTE — Assessment & Plan Note (Signed)
Continue PT hopefull can increase ROM.  Analgesics for pain

## 2011-08-04 NOTE — Assessment & Plan Note (Signed)
Unchanged.  No red flags.  [place] was unrevealing.  Suggest weight control

## 2011-08-04 NOTE — Progress Notes (Signed)
  Subjective:    Patient ID: Latoya Swanson, female    DOB: 03/31/1951, 60 y.o.   MRN: [id number]  HPI  Chest Pain Continues on and off.  Now focally along left sternum.  Movement does not change it  No shortness of breath or pain with exertion not involving shoulders.  Last for minutes.    Pelvic Pain Continues about the same perhaps less intense.  Thinks may be her tight clothes and weight  No change in stomach girth.  No limb swelling.  No GI symptoms of diarrhea or nausea or vomiting  Right Shoulder Continues to hurt.   Is having PT regularly which may help some with ROM.  Taking diclofenac and ultram prn.  No weakness in hand  Review of Systems see above  PMH - no history of heart disease     Objective:   Physical Exam  Heart - Regular rate and rhythm.  No murmurs, gallops or rubs.    Lungs:  Normal respiratory effort, chest expands symmetrically. Lungs are clear to auscultation, no crackles or wheezes. Abdomen: soft and without masses, organomegaly or hernias noted.  No guarding or rebound.  Mild pain with deep palpation Chest - tender focally lower left sternal border no deformity      Assessment & Plan:

## 2011-08-04 NOTE — Patient Instructions (Addendum)
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  Otherwise I will send you a letter.  If you do not hear from me with in 2 weeks please call our office.

## 2011-08-04 NOTE — Assessment & Plan Note (Signed)
Most consistent with musculoskeletal - costrochondritis.  No signs of coronary artery disease

## 2011-08-05 ENCOUNTER — Telehealth: Payer: Self-pay | Admitting: Family Medicine

## 2011-08-05 LAB — CBC
HCT: 42.6 % (ref 36.0–46.0)
Hemoglobin: 14.6 g/dL (ref 12.0–15.0)
MCH: 32.4 pg (ref 26.0–34.0)
MCHC: 34.3 g/dL (ref 30.0–36.0)
MCV: 94.5 fL (ref 78.0–100.0)
Platelets: 173 10*3/uL (ref 150–400)
RBC: 4.51 MIL/uL (ref 3.87–5.11)
RDW: 12.9 % (ref 11.5–15.5)
WBC: 6.1 10*3/uL (ref 4.0–10.5)

## 2011-08-05 LAB — COMPREHENSIVE METABOLIC PANEL
ALT: 297 U/L — ABNORMAL HIGH (ref 0–35)
AST: 276 U/L — ABNORMAL HIGH (ref 0–37)
Albumin: 4.5 g/dL (ref 3.5–5.2)
Alkaline Phosphatase: 103 U/L (ref 39–117)
BUN: 8 mg/dL (ref 6–23)
CO2: 28 mEq/L (ref 19–32)
Calcium: 9.5 mg/dL (ref 8.4–10.5)
Chloride: 103 mEq/L (ref 96–112)
Creat: 0.57 mg/dL (ref 0.50–1.10)
Glucose, Bld: 85 mg/dL (ref 70–99)
Potassium: 3.1 mEq/L — ABNORMAL LOW (ref 3.5–5.3)
Sodium: 141 mEq/L (ref 135–145)
Total Bilirubin: 0.7 mg/dL (ref 0.3–1.2)
Total Protein: 7.9 g/dL (ref 6.0–8.3)

## 2011-08-05 NOTE — Telephone Encounter (Signed)
Told her that her potassium was low and she should increase the Bananas and OJ that she eats.  Will check when she returns for a visit

## 2011-08-06 LAB — CBC
MCHC: 34.8
MCV: 95.3
Platelets: 133 — ABNORMAL LOW
RDW: 12.4
WBC: 5.6

## 2011-08-06 LAB — BASIC METABOLIC PANEL
BUN: 9
Chloride: 108
Creatinine, Ser: 0.66

## 2011-08-06 LAB — DIFFERENTIAL
Basophils Absolute: 0
Basophils Relative: 1
Eosinophils Absolute: 0.1
Lymphs Abs: 2.7
Neutrophils Relative %: 39 — ABNORMAL LOW

## 2011-08-06 LAB — POCT HEMOGLOBIN-HEMACUE: Hemoglobin: 14

## 2011-08-13 ENCOUNTER — Other Ambulatory Visit: Payer: Self-pay | Admitting: Family Medicine

## 2011-08-13 NOTE — Telephone Encounter (Signed)
Refill request

## 2011-09-20 ENCOUNTER — Other Ambulatory Visit: Payer: Self-pay | Admitting: Family Medicine

## 2011-09-22 NOTE — Telephone Encounter (Signed)
Refill request

## 2011-10-20 ENCOUNTER — Ambulatory Visit (INDEPENDENT_AMBULATORY_CARE_PROVIDER_SITE_OTHER): Payer: No Typology Code available for payment source | Admitting: Family Medicine

## 2011-10-20 ENCOUNTER — Encounter: Payer: Self-pay | Admitting: Family Medicine

## 2011-10-20 DIAGNOSIS — R079 Chest pain, unspecified: Secondary | ICD-10-CM

## 2011-10-20 DIAGNOSIS — M19019 Primary osteoarthritis, unspecified shoulder: Secondary | ICD-10-CM

## 2011-10-20 MED ORDER — GABAPENTIN 300 MG PO CAPS: 300.0000 mg | ORAL_CAPSULE | Freq: Three times a day (TID) | ORAL | Status: DC

## 2011-10-20 NOTE — Patient Instructions (Signed)
Increase your gabapentin Neurontin to 300 mg three times daily for at least one week  If pain is not better then increase to 2 tabs 600 mg three times daily   Consider seeing a chiropracter for the neck and shoulder pain  If not better in one month come back for an injection

## 2011-10-20 NOTE — Progress Notes (Signed)
  Subjective:    Patient ID: Latoya Swanson, female    DOB: Aug 01, 1951, 60 y.o.   MRN: [id number]  HPI  JOINT PAIN Location: R side of neck and R shoulder and also mid chest.   Course:  Over last week the shoulder and neck have worsened. The chest has been hurting on and off since last visit.  Chest pain is worse with movement and taking a deep breath.  No exertional chest pain Worse with:  Movement of shulder or neck Better with:  voltaren or tramadol.  Tried the zostrix but was too strong.  Tried going to Physical therapy but transportation was an issue Trauma: no falls  Swelling:  no Locking:  no Rash: no Fever:  No  PMH Her mental health provider suggested going up on gabapentin might help her anxiety  Review of Systems     Objective:   Physical Exam Mild distress Decreased ROM in R Shoulder and in neck especially with internal rotation No menigeal sxs Distal grip is strong on R.   Heart - Regular rate and rhythm.  No murmurs, gallops or rubs.    Lungs:  Normal respiratory effort, chest expands symmetrically. Lungs are clear to auscultation, no crackles or wheezes. Chest wall - tender over rib insertion into sternum.  No soft tissue swelling       Assessment & Plan:

## 2011-10-20 NOTE — Assessment & Plan Note (Signed)
Worsened.  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  Will try increasing her gabapentin and suggested chiropracter.  May try injection if persists

## 2011-10-20 NOTE — Assessment & Plan Note (Signed)
Constrochondritis.  No signs of coronary artery disease.  See DJD treatment

## 2011-11-18 ENCOUNTER — Other Ambulatory Visit: Payer: Self-pay | Admitting: Family Medicine

## 2011-11-18 NOTE — Telephone Encounter (Signed)
Refill request

## 2011-12-24 ENCOUNTER — Ambulatory Visit: Payer: No Typology Code available for payment source | Admitting: Family Medicine

## 2011-12-25 ENCOUNTER — Ambulatory Visit (INDEPENDENT_AMBULATORY_CARE_PROVIDER_SITE_OTHER): Payer: No Typology Code available for payment source | Admitting: Family Medicine

## 2011-12-25 ENCOUNTER — Encounter: Payer: Self-pay | Admitting: Family Medicine

## 2011-12-25 VITALS — BP 124/78 | HR 76 | Temp 97.6°F | Ht 65.0 in | Wt 199.0 lb

## 2011-12-25 DIAGNOSIS — M7580 Other shoulder lesions, unspecified shoulder: Secondary | ICD-10-CM

## 2011-12-25 DIAGNOSIS — M67919 Unspecified disorder of synovium and tendon, unspecified shoulder: Secondary | ICD-10-CM

## 2011-12-25 DIAGNOSIS — M719 Bursopathy, unspecified: Secondary | ICD-10-CM

## 2011-12-25 DIAGNOSIS — M19019 Primary osteoarthritis, unspecified shoulder: Secondary | ICD-10-CM

## 2011-12-25 NOTE — Patient Instructions (Signed)
Impingement Syndrome, Rotator Cuff, Bursitis with Rehab Impingement syndrome is a condition that involves inflammation of the tendons of the rotator cuff and the subacromial bursa, that causes pain in the shoulder. The rotator cuff consists of four tendons and muscles that control much of the shoulder and upper arm function. The subacromial bursa is a fluid filled sac that helps reduce friction between the rotator cuff and one of the bones of the shoulder (acromion). Impingement syndrome is usually an overuse injury that causes swelling of the bursa (bursitis), swelling of the tendon (tendonitis), and/or a tear of the tendon (strain). Strains are classified into three categories. Grade 1 strains cause pain, but the tendon is not lengthened. Grade 2 strains include a lengthened ligament, due to the ligament being stretched or partially ruptured. With grade 2 strains there is still function, although the function may be decreased. Grade 3 strains include a complete tear of the tendon or muscle, and function is usually impaired. SYMPTOMS   Pain around the shoulder, often at the outer portion of the upper arm.   Pain that gets worse with shoulder function, especially when reaching overhead or lifting.   Sometimes, aching when not using the arm.   Pain that wakes you up at night.   Sometimes, tenderness, swelling, warmth, or redness over the affected area.   Loss of strength.   Limited motion of the shoulder, especially reaching behind the back (to the back pocket or to unhook bra) or across your body.   Crackling sound (crepitation) when moving the arm.   Biceps tendon pain and inflammation (in the front of the shoulder). Worse when bending the elbow or lifting.  CAUSES  Impingement syndrome is often an overuse injury, in which chronic (repetitive) motions cause the tendons or bursa to become inflamed. A strain occurs when a force is paced on the tendon or muscle that is greater than it can  withstand. Common mechanisms of injury include: Stress from sudden increase in duration, frequency, or intensity of training.  Direct hit (trauma) to the shoulder.   Aging, erosion of the tendon with normal use.   Bony bump on shoulder (acromial spur).  RISK INCREASES WITH:  Contact sports (football, wrestling, boxing).   Throwing sports (baseball, tennis, volleyball).   Weightlifting and bodybuilding.   Heavy labor.   Previous injury to the rotator cuff, including impingement.   Poor shoulder strength and flexibility.   Failure to warm up properly before activity.   Inadequate protective equipment.   Old age.   Bony bump on shoulder (acromial spur).  PREVENTION   Warm up and stretch properly before activity.   Allow for adequate recovery between workouts.   Maintain physical fitness:   Strength, flexibility, and endurance.   Cardiovascular fitness.   Learn and use proper exercise technique.  PROGNOSIS  If treated properly, impingement syndrome usually goes away within 6 weeks. Sometimes surgery is required.  RELATED COMPLICATIONS   Longer healing time if not properly treated, or if not given enough time to heal.   Recurring symptoms, that result in a chronic condition.   Shoulder stiffness, frozen shoulder, or loss of motion.   Rotator cuff tendon tear.   Recurring symptoms, especially if activity is resumed too soon, with overuse, with a direct blow, or when using poor technique.  TREATMENT  Treatment first involves the use of ice and medicine, to reduce pain and inflammation. The use of strengthening and stretching exercises may help reduce pain with activity. These exercises may   be performed at home or with a therapist. If non-surgical treatment is unsuccessful after more than 6 months, surgery may be advised. After surgery and rehabilitation, activity is usually possible in 3 months.  MEDICATION  If pain medicine is needed, nonsteroidal  anti-inflammatory medicines (aspirin and ibuprofen), or other minor pain relievers (acetaminophen), are often advised.   Do not take pain medicine for 7 days before surgery.   Prescription pain relievers may be given, if your caregiver thinks they are needed. Use only as directed and only as much as you need.   Corticosteroid injections may be given by your caregiver. These injections should be reserved for the most serious cases, because they may only be given a certain number of times.  HEAT AND COLD  Cold treatment (icing) should be applied for 10 to 15 minutes every 2 to 3 hours for inflammation and pain, and immediately after activity that aggravates your symptoms. Use ice packs or an ice massage.   Heat treatment may be used before performing stretching and strengthening activities prescribed by your caregiver, physical therapist, or athletic trainer. Use a heat pack or a warm water soak.  SEEK MEDICAL CARE IF:   Symptoms get worse or do not improve in 4 to 6 weeks, despite treatment.   New, unexplained symptoms develop. (Drugs used in treatment may produce side effects.)  EXERCISES  RANGE OF MOTION (ROM) AND STRETCHING EXERCISES - Impingement Syndrome (Rotator Cuff  Tendinitis, Bursitis) These exercises may help you when beginning to rehabilitate your injury. Your symptoms may go away with or without further involvement from your physician, physical therapist or athletic trainer. While completing these exercises, remember:   Restoring tissue flexibility helps normal motion to return to the joints. This allows healthier, less painful movement and activity.   An effective stretch should be held for at least 30 seconds.   A stretch should never be painful. You should only feel a gentle lengthening or release in the stretched tissue.  STRETCH - Flexion, Standing  Stand with good posture. With an underhand grip on your right / left hand, and an overhand grip on the opposite hand, grasp  a broomstick or cane so that your hands are a little more than shoulder width apart.   Keeping your right / left elbow straight and shoulder muscles relaxed, push the stick with your opposite hand, to raise your right / left arm in front of your body and then overhead. Raise your arm until you feel a stretch in your right / left shoulder, but before you have increased shoulder pain.   Try to avoid shrugging your right / left shoulder as your arm rises, by keeping your shoulder blade tucked down and toward your mid-back spine. Hold for __________ seconds.   Slowly return to the starting position.  Repeat __________ times. Complete this exercise __________ times per day. STRETCH - Abduction, Supine  Lie on your back. With an underhand grip on your right / left hand and an overhand grip on the opposite hand, grasp a broomstick or cane so that your hands are a little more than shoulder width apart.   Keeping your right / left elbow straight and your shoulder muscles relaxed, push the stick with your opposite hand, to raise your right / left arm out to the side of your body and then overhead. Raise your arm until you feel a stretch in your right / left shoulder, but before you have increased shoulder pain.   Try to avoid shrugging   your right / left shoulder as your arm rises, by keeping your shoulder blade tucked down and toward your mid-back spine. Hold for __________ seconds.   Slowly return to the starting position.  Repeat __________ times. Complete this exercise __________ times per day. ROM - Flexion, Active-Assisted  Lie on your back. You may bend your knees for comfort.   Grasp a broomstick or cane so your hands are about shoulder width apart. Your right / left hand should grip the end of the stick, so that your hand is positioned "thumbs-up," as if you were about to shake hands.   Using your healthy arm to lead, raise your right / left arm overhead, until you feel a gentle stretch in your  shoulder. Hold for __________ seconds.   Use the stick to assist in returning your right / left arm to its starting position.  Repeat __________ times. Complete this exercise __________ times per day.  ROM - Internal Rotation, Supine   Lie on your back on a firm surface. Place your right / left elbow about 60 degrees away from your side. Elevate your elbow with a folded towel, so that the elbow and shoulder are the same height.   Using a broomstick or cane and your strong arm, pull your right / left hand toward your body until you feel a gentle stretch, but no increase in your shoulder pain. Keep your shoulder and elbow in place throughout the exercise.   Hold for __________ seconds. Slowly return to the starting position.  Repeat __________ times. Complete this exercise __________ times per day. STRETCH - Internal Rotation  Place your right / left hand behind your back, palm up.   Throw a towel or belt over your opposite shoulder. Grasp the towel with your right / left hand.   While keeping an upright posture, gently pull up on the towel, until you feel a stretch in the front of your right / left shoulder.   Avoid shrugging your right / left shoulder as your arm rises, by keeping your shoulder blade tucked down and toward your mid-back spine.   Hold for __________ seconds. Release the stretch, by lowering your healthy hand.  Repeat __________ times. Complete this exercise __________ times per day. ROM - Internal Rotation   Using an underhand grip, grasp a stick behind your back with both hands.   While standing upright with good posture, slide the stick up your back until you feel a mild stretch in the front of your shoulder.   Hold for __________ seconds. Slowly return to your starting position.  Repeat __________ times. Complete this exercise __________ times per day.  STRETCH - Posterior Shoulder Capsule   Stand or sit with good posture. Grasp your right / left elbow and draw it  across your chest, keeping it at the same height as your shoulder.   Pull your elbow, so your upper arm comes in closer to your chest. Pull until you feel a gentle stretch in the back of your shoulder.   Hold for __________ seconds.  Repeat __________ times. Complete this exercise __________ times per day. STRENGTHENING EXERCISES - Impingement Syndrome (Rotator Cuff Tendinitis, Bursitis) These exercises may help you when beginning to rehabilitate your injury. They may resolve your symptoms with or without further involvement from your physician, physical therapist or athletic trainer. While completing these exercises, remember:  Muscles can gain both the endurance and the strength needed for everyday activities through controlled exercises.   Complete these exercises as   instructed by your physician, physical therapist or athletic trainer. Increase the resistance and repetitions only as guided.   You may experience muscle soreness or fatigue, but the pain or discomfort you are trying to eliminate should never worsen during these exercises. If this pain does get worse, stop and make sure you are following the directions exactly. If the pain is still present after adjustments, discontinue the exercise until you can discuss the trouble with your clinician.   During your recovery, avoid activity or exercises which involve actions that place your injured hand or elbow above your head or behind your back or head. These positions stress the tissues which you are trying to heal.  STRENGTH - Scapular Depression and Adduction   With good posture, sit on a firm chair. Support your arms in front of you, with pillows, arm rests, or on a table top. Have your elbows in line with the sides of your body.   Gently draw your shoulder blades down and toward your mid-back spine. Gradually increase the tension, without tensing the muscles along the top of your shoulders and the back of your neck.   Hold for  __________ seconds. Slowly release the tension and relax your muscles completely before starting the next repetition.   After you have practiced this exercise, remove the arm support and complete the exercise in standing as well as sitting position.  Repeat __________ times. Complete this exercise __________ times per day.  STRENGTH - Shoulder Abductors, Isometric  With good posture, stand or sit about 4-6 inches from a wall, with your right / left side facing the wall.   Bend your right / left elbow. Gently press your right / left elbow into the wall. Increase the pressure gradually, until you are pressing as hard as you can, without shrugging your shoulder or increasing any shoulder discomfort.   Hold for __________ seconds.   Release the tension slowly. Relax your shoulder muscles completely before you begin the next repetition.  Repeat __________ times. Complete this exercise __________ times per day.  STRENGTH - External Rotators, Isometric  Keep your right / left elbow at your side and bend it 90 degrees.   Step into a door frame so that the outside of your right / left wrist can press against the door frame without your upper arm leaving your side.   Gently press your right / left wrist into the door frame, as if you were trying to swing the back of your hand away from your stomach. Gradually increase the tension, until you are pressing as hard as you can, without shrugging your shoulder or increasing any shoulder discomfort.   Hold for __________ seconds.   Release the tension slowly. Relax your shoulder muscles completely before you begin the next repetition.  Repeat __________ times. Complete this exercise __________ times per day.  STRENGTH - Supraspinatus   Stand or sit with good posture. Grasp a __________ weight, or an exercise band or tubing, so that your hand is "thumbs-up," like you are shaking hands.   Slowly lift your right / left arm in a "V" away from your thigh,  diagonally into the space between your side and straight ahead. Lift your hand to shoulder height or as far as you can, without increasing any shoulder pain. At first, many people do not lift their hands above shoulder height.   Avoid shrugging your right / left shoulder as your arm rises, by keeping your shoulder blade tucked down and toward your mid-back   spine.   Hold for __________ seconds. Control the descent of your hand, as you slowly return to your starting position.  Repeat __________ times. Complete this exercise __________ times per day.  STRENGTH - External Rotators  Secure a rubber exercise band or tubing to a fixed object (table, pole) so that it is at the same height as your right / left elbow when you are standing or sitting on a firm surface.   Stand or sit so that the secured exercise band is at your uninjured side.   Bend your right / left elbow 90 degrees. Place a folded towel or small pillow under your right / left arm, so that your elbow is a few inches away from your side.   Keeping the tension on the exercise band, pull it away from your body, as if pivoting on your elbow. Be sure to keep your body steady, so that the movement is coming only from your rotating shoulder.   Hold for __________ seconds. Release the tension in a controlled manner, as you return to the starting position.  Repeat __________ times. Complete this exercise __________ times per day.  STRENGTH - Internal Rotators   Secure a rubber exercise band or tubing to a fixed object (table, pole) so that it is at the same height as your right / left elbow when you are standing or sitting on a firm surface.   Stand or sit so that the secured exercise band is at your right / left side.   Bend your elbow 90 degrees. Place a folded towel or small pillow under your right / left arm so that your elbow is a few inches away from your side.   Keeping the tension on the exercise band, pull it across your body,  toward your stomach. Be sure to keep your body steady, so that the movement is coming only from your rotating shoulder.   Hold for __________ seconds. Release the tension in a controlled manner, as you return to the starting position.  Repeat __________ times. Complete this exercise __________ times per day.  STRENGTH - Scapular Protractors, Standing   Stand arms length away from a wall. Place your hands on the wall, keeping your elbows straight.   Begin by dropping your shoulder blades down and toward your mid-back spine.   To strengthen your protractors, keep your shoulder blades down, but slide them forward on your rib cage. It will feel as if you are lifting the back of your rib cage away from the wall. This is a subtle motion and can be challenging to complete. Ask your caregiver for further instruction, if you are not sure you are doing the exercise correctly.   Hold for __________ seconds. Slowly return to the starting position, resting the muscles completely before starting the next repetition.  Repeat __________ times. Complete this exercise __________ times per day. STRENGTH - Scapular Protractors, Supine  Lie on your back on a firm surface. Extend your right / left arm straight into the air while holding a __________ weight in your hand.   Keeping your head and back in place, lift your shoulder off the floor.   Hold for __________ seconds. Slowly return to the starting position, and allow your muscles to relax completely before starting the next repetition.  Repeat __________ times. Complete this exercise __________ times per day. STRENGTH - Scapular Protractors, Quadruped  Get onto your hands and knees, with your shoulders directly over your hands (or as close as you can   be, comfortably).   Keeping your elbows locked, lift the back of your rib cage up into your shoulder blades, so your mid-back rounds out. Keep your neck muscles relaxed.   Hold this position for __________  seconds. Slowly return to the starting position and allow your muscles to relax completely before starting the next repetition.  Repeat __________ times. Complete this exercise __________ times per day.  STRENGTH - Scapular Retractors  Secure a rubber exercise band or tubing to a fixed object (table, pole), so that it is at the height of your shoulders when you are either standing, or sitting on a firm armless chair.   With a palm down grip, grasp an end of the band in each hand. Straighten your elbows and lift your hands straight in front of you, at shoulder height. Step back, away from the secured end of the band, until it becomes tense.   Squeezing your shoulder blades together, draw your elbows back toward your sides, as you bend them. Keep your upper arms lifted away from your body throughout the exercise.   Hold for __________ seconds. Slowly ease the tension on the band, as you reverse the directions and return to the starting position.  Repeat __________ times. Complete this exercise __________ times per day. STRENGTH - Shoulder Extensors   Secure a rubber exercise band or tubing to a fixed object (table, pole) so that it is at the height of your shoulders when you are either standing, or sitting on a firm armless chair.   With a thumbs-up grip, grasp an end of the band in each hand. Straighten your elbows and lift your hands straight in front of you, at shoulder height. Step back, away from the secured end of the band, until it becomes tense.   Squeezing your shoulder blades together, pull your hands down to the sides of your thighs. Do not allow your hands to go behind you.   Hold for __________ seconds. Slowly ease the tension on the band, as you reverse the directions and return to the starting position.  Repeat __________ times. Complete this exercise __________ times per day.  STRENGTH - Scapular Retractors and External Rotators   Secure a rubber exercise band or tubing to a  fixed object (table, pole) so that it is at the height as your shoulders, when you are either standing, or sitting on a firm armless chair.   With a palm down grip, grasp an end of the band in each hand. Bend your elbows 90 degrees and lift your elbows to shoulder height, at your sides. Step back, away from the secured end of the band, until it becomes tense.   Squeezing your shoulder blades together, rotate your shoulders so that your upper arms and elbows remain stationary, but your fists travel upward to head height.   Hold for __________ seconds. Slowly ease the tension on the band, as you reverse the directions and return to the starting position.  Repeat __________ times. Complete this exercise __________ times per day.  STRENGTH - Scapular Retractors and External Rotators, Rowing   Secure a rubber exercise band or tubing to a fixed object (table, pole) so that it is at the height of your shoulders, when you are either standing, or sitting on a firm armless chair.   With a palm down grip, grasp an end of the band in each hand. Straighten your elbows and lift your hands straight in front of you, at shoulder height. Step back, away from the   secured end of the band, until it becomes tense.   Step 1: Squeeze your shoulder blades together. Bending your elbows, draw your hands to your chest, as if you are rowing a boat. At the end of this motion, your hands and elbow should be at shoulder height and your elbows should be out to your sides.   Step 2: Rotate your shoulders, to raise your hands above your head. Your forearms should be vertical and your upper arms should be horizontal.   Hold for __________ seconds. Slowly ease the tension on the band, as you reverse the directions and return to the starting position.  Repeat __________ times. Complete this exercise __________ times per day.  STRENGTH - Scapular Depressors  Find a sturdy chair without wheels, such as a dining room chair.    Keeping your feet on the floor, and your hands on the chair arms, lift your bottom up from the seat, and lock your elbows.   Keeping your elbows straight, allow gravity to pull your body weight down. Your shoulders will rise toward your ears.   Raise your body against gravity by drawing your shoulder blades down your back, shortening the distance between your shoulders and ears. Although your feet should always maintain contact with the floor, your feet should progressively support less body weight, as you get stronger.   Hold for __________ seconds. In a controlled and slow manner, lower your body weight to begin the next repetition.  Repeat __________ times. Complete this exercise __________ times per day.  Document Released: 10/27/2005 Document Revised: 07/09/2011 Document Reviewed: 02/08/2009 [employer] Patient Information 2012 [employer] 

## 2011-12-25 NOTE — Progress Notes (Signed)
  Subjective:    Latoya Swanson is a 61 y.o. female who presents with right shoulder pain. The symptoms began several months ago. She has been seen by Dr. Deretha Emory for this in the past. Patient has even had a shoulder injection before. Patient states that the injection. Year ago did help significantly but would like another one. Patient states that this has started to affect her activities of daily living and she is decreasing the amount of movement she has. Aggravating factors: repetitive activity, work and housework.. Pain is located between the neck and shoulder and diffusely throughout the shoulder. Discomfort is described as aching, burning and sharp/stabbing. Symptoms are exacerbated by repetitive movements and overhead movements. Evaluation to date: plain films: normal. Therapy to date includes: rest, ice, avoidance of offending activity and OTC analgesics which are ineffective. Patient has been on Neurontin with minimal improvement. The following portions of the patient's history were reviewed and updated as appropriate: allergies, current medications, past family history, past medical history, past social history, past surgical history and problem list. Patient does have history of 2 arthroscopic procedures done in the past. Do not see these in EMR. Review of Systems Pertinent items are noted in HPI.   Objective:    BP 124/78  Pulse 76  Temp(Src) 97.6 F (36.4 C) (Oral)  Ht 5\' 5"  (1.651 m)  Wt 199 lb (90.266 kg)  BMI 33.12 kg/m2 Right shoulder: tenderness over the glenohumeral joint, distal neuromuscular exam negative, positive for impingement sign, sensory exam normal and radial pulse intact  Left shoulder: normal active ROM, no tenderness, no impingement sign    Procedure note After verbal and written consent given pt was prepped with betadine.  1:4 kenalog 40 to lidocaine used in right shoulder.  Pt minimal bleeding dressed with band aid, Pt given red flags to look for pt had better  pain control immediatly.    Assessment:    Right rotator cuff tendinitis, shoulder pain    Plan:    Natural history and expected course discussed. Questions answered. Agricultural engineer distributed. Reduction in offending activity. Gentle ROM exercises. Rest, ice, compression, and elevation (RICE) therapy. Shoulder injection. See procedure note. Follow up in 1 month.

## 2011-12-25 NOTE — Assessment & Plan Note (Signed)
Patient actually has had 2 arthroscopic procedures done of the shoulder previously. Today's exam correlates with rotator cuff impingement. This is likely do to overuse as well as DJD. After verbal and written consent patient did have an injection done see procedure note. Patient given exercises to do at home starting in 72 hours. Patient also to do ice as needed. Patient return in 4 weeks for reevaluation by primary care provider. If still tender at that time would consider sending her to orthopedics for another opinion.

## 2012-01-01 ENCOUNTER — Other Ambulatory Visit: Payer: Self-pay | Admitting: Family Medicine

## 2012-01-01 NOTE — Telephone Encounter (Signed)
Refill request

## 2012-01-26 ENCOUNTER — Encounter: Payer: Self-pay | Admitting: Home Health Services

## 2012-03-03 ENCOUNTER — Ambulatory Visit (INDEPENDENT_AMBULATORY_CARE_PROVIDER_SITE_OTHER): Payer: No Typology Code available for payment source | Admitting: Family Medicine

## 2012-03-03 ENCOUNTER — Encounter: Payer: Self-pay | Admitting: Family Medicine

## 2012-03-03 VITALS — BP 130/81 | HR 73 | Temp 97.9°F | Ht 65.0 in | Wt 195.0 lb

## 2012-03-03 DIAGNOSIS — B1710 Acute hepatitis C without hepatic coma: Secondary | ICD-10-CM

## 2012-03-03 DIAGNOSIS — I10 Essential (primary) hypertension: Secondary | ICD-10-CM

## 2012-03-03 DIAGNOSIS — M19019 Primary osteoarthritis, unspecified shoulder: Secondary | ICD-10-CM

## 2012-03-03 DIAGNOSIS — F17200 Nicotine dependence, unspecified, uncomplicated: Secondary | ICD-10-CM

## 2012-03-03 DIAGNOSIS — Z720 Tobacco use: Secondary | ICD-10-CM

## 2012-03-03 DIAGNOSIS — K219 Gastro-esophageal reflux disease without esophagitis: Secondary | ICD-10-CM

## 2012-03-03 LAB — COMPREHENSIVE METABOLIC PANEL
AST: 149 U/L — ABNORMAL HIGH (ref 0–37)
BUN: 10 mg/dL (ref 6–23)
Calcium: 9.1 mg/dL (ref 8.4–10.5)
Chloride: 107 mEq/L (ref 96–112)
Creat: 0.57 mg/dL (ref 0.50–1.10)

## 2012-03-03 LAB — CBC
HCT: 44.6 % (ref 36.0–46.0)
Hemoglobin: 15 g/dL (ref 12.0–15.0)
MCHC: 33.6 g/dL (ref 30.0–36.0)
RBC: 4.73 MIL/uL (ref 3.87–5.11)

## 2012-03-03 MED ORDER — DICLOFENAC SODIUM 75 MG PO TBEC: 75.0000 mg | DELAYED_RELEASE_TABLET | Freq: Two times a day (BID) | ORAL | Status: DC

## 2012-03-03 MED ORDER — OMEPRAZOLE 20 MG PO CPDR: 20.0000 mg | DELAYED_RELEASE_CAPSULE | Freq: Every day | ORAL | Status: DC | PRN

## 2012-03-03 NOTE — Patient Instructions (Signed)
Shoulder Pain Take the gabapentin every day Take Tylenol, Tramadol or Diclofenac ONLY as you really need them.  Do not take more than 4 tylenol tabs a day.  Take the diclofenac with a little food.  Smoking You know this is bad and is probably what is causing your reflux to worsen. Find a subsitute for when you feel you need a cigarette  Liver I will call you if your tests are not good.  Otherwise I will send you a letter.  If you do not hear from me with in 2 weeks please call our office.     Weight Great working losing the weight.  Keep up the diet and exercise  Check with your Insurance about the Shingles vaccine

## 2012-03-03 NOTE — Assessment & Plan Note (Signed)
Urged her to follow up with the [facility].  Will check labs with [facility] today.

## 2012-03-03 NOTE — Assessment & Plan Note (Signed)
Well controlled.  Check labs  

## 2012-03-03 NOTE — Assessment & Plan Note (Signed)
Worsened after some improvement with injection 2 months ago.  Discussed using analgesics and keeping range of motion.  May inject later if she would like

## 2012-03-03 NOTE — Assessment & Plan Note (Signed)
Her current chest pain is consistent with GERD.  No red flags for coronary artery disease or ulcer cancer.  Stop smoking and use PPI as needed

## 2012-03-03 NOTE — Assessment & Plan Note (Signed)
Restarted.  Discussed consequences and strategies

## 2012-03-03 NOTE — Progress Notes (Signed)
  Subjective:    Patient ID: Latoya Swanson, female    DOB: April 30, 1951, 61 y.o.   MRN: [id number]  HPI  Shoulder Pain Worsening on the left.  About same on right.  Injection helped for about a month.  Pain with movement.  No weakness or change in sensation.   No rash.     HYPERTENSION Disease Monitoring Home BP Monitoring not doing Chest pain- see below     Dyspnea-  no  Medications Compliance: taking as prescribed. Lightheadedness-  no  Edema-  no   ROS - See HPI  PMH Lab Review   Potassium  Date Value Range Status  08/04/2011 3.1* 3.5-5.3 (mEq/L) Final     Sodium  Date Value Range Status  08/04/2011 141  135-145 (mEq/L) Final      Chest Pain - Reflux Having burning in throat and epigastric area in AM.  No nausea or vomiting or bleeding or problem swallowing.  No pain on exertion or shortness of breath.  Started smoking again.    Ran out of prilosec  Review of Symptoms - see HPI  PMH - Smoking status noted.        Review of Systems     Objective:   Physical Exam  Heart - Regular rate and rhythm.  No murmurs, gallops or rubs.    Lungs:  Normal respiratory effort, chest expands symmetrically. Lungs are clear to auscultation, no crackles or wheezes. Abdomen: soft and non-tender without masses, organomegaly or hernias noted.  No guarding or rebound Extremities:  No cyanosis, edema, or deformity noted with good range of motion of all major joints except left shoulder where lacks internal rotation which is painful Distal strenght in normal in both arms.         Assessment & Plan:

## 2012-03-05 ENCOUNTER — Encounter: Payer: Self-pay | Admitting: Family Medicine

## 2012-03-06 ENCOUNTER — Other Ambulatory Visit: Payer: Self-pay | Admitting: Family Medicine

## 2012-04-19 ENCOUNTER — Other Ambulatory Visit: Payer: Self-pay | Admitting: Family Medicine

## 2012-04-19 DIAGNOSIS — I10 Essential (primary) hypertension: Secondary | ICD-10-CM

## 2012-04-30 ENCOUNTER — Emergency Department (HOSPITAL_COMMUNITY)
Admission: EM | Admit: 2012-04-30 | Discharge: 2012-04-30 | Payer: No Typology Code available for payment source | Source: Home / Self Care | Attending: Emergency Medicine | Admitting: Emergency Medicine

## 2012-04-30 ENCOUNTER — Encounter (HOSPITAL_COMMUNITY): Payer: Self-pay | Admitting: *Deleted

## 2012-04-30 ENCOUNTER — Encounter: Payer: Self-pay | Admitting: Family Medicine

## 2012-04-30 ENCOUNTER — Inpatient Hospital Stay (HOSPITAL_COMMUNITY)
Admission: EM | Admit: 2012-04-30 | Discharge: 2012-05-04 | DRG: 690 | Disposition: A | Discharge status: Home / Self Care | Payer: No Typology Code available for payment source | Attending: Family Medicine | Admitting: Family Medicine

## 2012-04-30 ENCOUNTER — Emergency Department (HOSPITAL_COMMUNITY): Payer: No Typology Code available for payment source

## 2012-04-30 ENCOUNTER — Ambulatory Visit (INDEPENDENT_AMBULATORY_CARE_PROVIDER_SITE_OTHER): Payer: No Typology Code available for payment source | Admitting: Family Medicine

## 2012-04-30 VITALS — BP 129/80 | HR 93 | Temp 101.3°F | Wt 194.0 lb

## 2012-04-30 DIAGNOSIS — F3289 Other specified depressive episodes: Secondary | ICD-10-CM | POA: Diagnosis present

## 2012-04-30 DIAGNOSIS — F17200 Nicotine dependence, unspecified, uncomplicated: Secondary | ICD-10-CM | POA: Diagnosis present

## 2012-04-30 DIAGNOSIS — N10 Acute tubulo-interstitial nephritis: Secondary | ICD-10-CM

## 2012-04-30 DIAGNOSIS — I10 Essential (primary) hypertension: Secondary | ICD-10-CM

## 2012-04-30 DIAGNOSIS — B1920 Unspecified viral hepatitis C without hepatic coma: Secondary | ICD-10-CM | POA: Diagnosis present

## 2012-04-30 DIAGNOSIS — F329 Major depressive disorder, single episode, unspecified: Secondary | ICD-10-CM | POA: Diagnosis present

## 2012-04-30 DIAGNOSIS — R509 Fever, unspecified: Secondary | ICD-10-CM

## 2012-04-30 DIAGNOSIS — E876 Hypokalemia: Secondary | ICD-10-CM | POA: Diagnosis present

## 2012-04-30 DIAGNOSIS — Z720 Tobacco use: Secondary | ICD-10-CM | POA: Diagnosis present

## 2012-04-30 DIAGNOSIS — G589 Mononeuropathy, unspecified: Secondary | ICD-10-CM | POA: Diagnosis present

## 2012-04-30 DIAGNOSIS — N12 Tubulo-interstitial nephritis, not specified as acute or chronic: Secondary | ICD-10-CM | POA: Diagnosis present

## 2012-04-30 DIAGNOSIS — E785 Hyperlipidemia, unspecified: Secondary | ICD-10-CM

## 2012-04-30 HISTORY — DX: Essential (primary) hypertension: I10

## 2012-04-30 HISTORY — DX: Anxiety disorder, unspecified: F41.9

## 2012-04-30 HISTORY — DX: Major depressive disorder, single episode, unspecified: F32.9

## 2012-04-30 HISTORY — DX: Depression, unspecified: F32.A

## 2012-04-30 HISTORY — DX: Gastro-esophageal reflux disease without esophagitis: K21.9

## 2012-04-30 LAB — COMPREHENSIVE METABOLIC PANEL
Albumin: 3 g/dL — ABNORMAL LOW (ref 3.5–5.2)
BUN: 9 mg/dL (ref 6–23)
Creatinine, Ser: 0.73 mg/dL (ref 0.50–1.10)
GFR calc Af Amer: >90 mL/min (ref >90)
Glucose, Bld: 97 mg/dL (ref 70–99)
Total Protein: 6.9 g/dL (ref 6.0–8.3)

## 2012-04-30 LAB — CBC
HCT: 37.2 % (ref 36.0–46.0)
Hemoglobin: 12.9 g/dL (ref 12.0–15.0)
MCH: 32 pg (ref 26.0–34.0)
MCHC: 34.7 g/dL (ref 30.0–36.0)
MCV: 92.3 fL (ref 78.0–100.0)

## 2012-04-30 LAB — DIFFERENTIAL
Basophils Relative: 0 % (ref 0–1)
Eosinophils Absolute: 0 10*3/uL (ref 0.0–0.7)
Eosinophils Relative: 0 % (ref 0–5)
Monocytes Absolute: 3.7 10*3/uL — ABNORMAL HIGH (ref 0.1–1.0)
Monocytes Relative: 15 % — ABNORMAL HIGH (ref 3–12)

## 2012-04-30 LAB — URINALYSIS, ROUTINE W REFLEX MICROSCOPIC
Ketones, ur: NEGATIVE mg/dL
Nitrite: NEGATIVE
Specific Gravity, Urine: 1.008 (ref 1.005–1.030)
Urobilinogen, UA: 4 mg/dL — ABNORMAL HIGH (ref 0.0–1.0)
pH: 6.5 (ref 5.0–8.0)

## 2012-04-30 LAB — URINE MICROSCOPIC-ADD ON

## 2012-04-30 LAB — LIPASE, BLOOD: Lipase: 10 U/L — ABNORMAL LOW (ref 11–59)

## 2012-04-30 MED ORDER — HYDROMORPHONE HCL PF 1 MG/ML IJ SOLN
1.0000 mg | Freq: Four times a day (QID) | INTRAMUSCULAR | Status: DC | PRN
  Administered 2012-05-01 (×2): 1 mg via INTRAVENOUS
  Filled 2012-04-30 (×3): qty 1

## 2012-04-30 MED ORDER — MORPHINE SULFATE 4 MG/ML IJ SOLN
4.0000 mg | Freq: Once | INTRAMUSCULAR | Status: AC
  Administered 2012-04-30: 4 mg via INTRAVENOUS
  Filled 2012-04-30: qty 1

## 2012-04-30 MED ORDER — ONDANSETRON HCL 4 MG/2ML IJ SOLN
4.0000 mg | Freq: Four times a day (QID) | INTRAMUSCULAR | Status: DC | PRN
  Administered 2012-05-01 (×2): 4 mg via INTRAVENOUS
  Filled 2012-04-30 (×4): qty 2

## 2012-04-30 MED ORDER — SODIUM CHLORIDE 0.9 % IV SOLN
1000.0000 mL | Freq: Once | INTRAVENOUS | Status: AC
  Administered 2012-04-30: 1000 mL via INTRAVENOUS

## 2012-04-30 MED ORDER — DEXTROSE 5 % IV SOLN
1.0000 g | Freq: Once | INTRAVENOUS | Status: AC
  Administered 2012-04-30: 1 g via INTRAVENOUS
  Filled 2012-04-30: qty 10

## 2012-04-30 MED ORDER — ACETAMINOPHEN 325 MG PO TABS
650.0000 mg | ORAL_TABLET | Freq: Once | ORAL | Status: AC
  Administered 2012-04-30: 650 mg via ORAL
  Filled 2012-04-30: qty 2

## 2012-04-30 MED ORDER — IOHEXOL 300 MG/ML  SOLN
100.0000 mL | Freq: Once | INTRAMUSCULAR | Status: AC | PRN
  Administered 2012-04-30: 100 mL via INTRAVENOUS

## 2012-04-30 MED ORDER — SODIUM CHLORIDE 0.9 % IV SOLN
1000.0000 mL | INTRAVENOUS | Status: DC
  Administered 2012-04-30 – 2012-05-02 (×6): 1000 mL via INTRAVENOUS

## 2012-04-30 MED ORDER — HYDROMORPHONE HCL PF 1 MG/ML IJ SOLN
1.0000 mg | Freq: Once | INTRAMUSCULAR | Status: AC
  Administered 2012-04-30: 1 mg via INTRAVENOUS
  Filled 2012-04-30 (×2): qty 1

## 2012-04-30 MED ORDER — ONDANSETRON HCL 4 MG/2ML IJ SOLN
4.0000 mg | Freq: Once | INTRAMUSCULAR | Status: AC
  Administered 2012-04-30: 4 mg via INTRAVENOUS
  Filled 2012-04-30: qty 2

## 2012-04-30 MED ORDER — IOHEXOL 300 MG/ML  SOLN
20.0000 mL | INTRAMUSCULAR | Status: AC
  Administered 2012-04-30 (×2): 20 mL via ORAL

## 2012-04-30 MED ORDER — SODIUM CHLORIDE 0.9 % IV BOLUS (SEPSIS)
1000.0000 mL | Freq: Once | INTRAVENOUS | Status: AC
  Administered 2012-04-30: 1000 mL via INTRAVENOUS

## 2012-04-30 MED ORDER — HYDROMORPHONE HCL PF 1 MG/ML IJ SOLN
1.0000 mg | Freq: Once | INTRAMUSCULAR | Status: AC
  Administered 2012-04-30: 1 mg via INTRAVENOUS

## 2012-04-30 MED ORDER — POTASSIUM CHLORIDE 10 MEQ/100ML IV SOLN
10.0000 meq | Freq: Once | INTRAVENOUS | Status: AC
  Administered 2012-04-30: 10 meq via INTRAVENOUS
  Filled 2012-04-30: qty 100

## 2012-04-30 NOTE — Assessment & Plan Note (Signed)
Fever of 101.3 in the setting of left lower quadrant and left flank pain with headache and mild neck stiffness is obviously worrisome and concerning. Plan to send her directly to the emergency room via private vehicle or further workup and evaluation can be done. I feel that initiate a workup here would likely be duplicating the workup in the emergency and would delay her further care. Discussed this case with Dr. Deirdre Priest who agrees. Discussed the importance of proceeding directly to the emergency room with the patient who expresses understanding.

## 2012-04-30 NOTE — ED Notes (Signed)
Complaining of vomiting and pain. Started on Tuesday. Pain intermittent in left side with radiation to upper abdomin. Similar episode years ago. Rates pain as 8/10

## 2012-04-30 NOTE — ED Provider Notes (Signed)
Patient's care assumed from Dr. Ranae Palms.  CT results c/w pyelo.  Patient will receive Ceftriaxone and be moved to CDU for further E/M.  Gerhard Munch, MD 04/30/12 [phone]

## 2012-04-30 NOTE — ED Provider Notes (Signed)
History     CSN: [id number]  Arrival date & time 04/30/12  1224   First MD Initiated Contact with Patient 04/30/12 1232      Chief Complaint  Patient presents with  . Emesis    (Consider location/radiation/quality/duration/timing/severity/associated sxs/prior treatment) HPI History from patient. 61 year old female who presents with abdominal pain and vomiting for the past 3 or 4 days. Abdominal pain is described as sharp and located to the left flank, and left side of the abdomen. It is described as sharp in nature and constant. There are no known aggravating/alleviating factors. No treatment for this prior to arrival. Patient states that she has had pyelonephritis in the past and this has felt somewhat similar.   She states that she has had persistent, copious emesis to the point that she is unable to hold down water. Emesis is nonbloody. She initially had some diarrhea with this but has not had any over the past several days. She is able to pass gas. She denies any urinary symptoms. She has additionally had fever at home with MAXIMUM TEMPERATURE 103.3 last evening.  Past Medical History  Diagnosis Date  . 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  . Normal echocardiogram 08/19/2007  . Normal cardiac stress test 2008     [name]    Past Surgical History  Procedure Date  . Anal fissure repair   . Tubal ligation   . Carpal tunnel release     bilat  . Shoulder surgery     twice     Family History  Problem Relation Age of Onset  . Coronary artery disease Father     died age 12  . Breast cancer Mother     died age 72  . Colon cancer Mother     History  Substance Use Topics  . Smoking status: Current Everyday Smoker  . Smokeless tobacco: Never Used   Comment: 2-3 cigs a day  . Alcohol Use: No    OB History    Grav Para Term Preterm Abortions TAB SAB Ect Mult Living                  Review of Systems  Constitutional: Positive  for fever, chills and appetite change. Negative for activity change.  Respiratory: Negative for cough, chest tightness and shortness of breath.   Cardiovascular: Negative for chest pain and palpitations.  Gastrointestinal: Positive for nausea, vomiting, abdominal pain and diarrhea. Negative for constipation, blood in stool, abdominal distention and anal bleeding.  Genitourinary: Negative for dysuria, decreased urine volume, vaginal bleeding and vaginal discharge.  Neurological: Positive for weakness.  All other systems reviewed and are negative.    Allergies  Review of patient's allergies indicates no known allergies.  Home Medications   Current Outpatient Rx  Name Route Sig Dispense Refill  . AMLODIPINE BESYLATE 10 MG PO TABS  TAKE 1 TABLET BY MOUTH DAILY 30 tablet 3  . ARIPIPRAZOLE 2 MG PO TABS Oral Take 2 mg by mouth daily. Per mental health    . ASPIRIN 81 MG PO TABS Oral Take 81 mg by mouth daily.      Marland Kitchen DICLOFENAC SODIUM 75 MG PO TBEC Oral Take 75 mg by mouth 2 (two) times daily.    Marland Kitchen FLUOXETINE HCL 20 MG PO CAPS Oral Take 60 mg by mouth daily.      Marland Kitchen GABAPENTIN 300 MG PO CAPS Oral Take 300 mg by mouth 3 (  three) times daily.    . ADULT MULTIVITAMIN W/MINERALS CH Oral Take 1 tablet by mouth daily.    . TRAMADOL HCL 50 MG PO TABS Oral Take 50 mg by mouth every 6 (six) hours as needed. As needed for pain.    . TRAZODONE HCL 50 MG PO TABS Oral Take 50 mg by mouth at bedtime as needed. As needed for sleep.    . TRIAMTERENE-HCTZ 37.5-25 MG PO TABS      . OMEPRAZOLE 20 MG PO CPDR Oral Take 20 mg by mouth daily as needed. As needed for when stomach bothers her.      BP 106/67  Pulse 77  Temp 99.8 F (37.7 C) (Oral)  Resp 18  SpO2 98%  Physical Exam  Nursing note and vitals reviewed. Constitutional: She appears well-developed and well-nourished. No distress.  HENT:  Head: Normocephalic and atraumatic.  Mouth/Throat: Oropharynx is clear and moist. No oropharyngeal exudate.        Mucus membranes moist  Eyes: Conjunctivae are normal.  Neck: Normal range of motion.  Cardiovascular: Normal rate, regular rhythm and normal heart sounds.  Exam reveals no gallop and no friction rub.   No murmur heard. Pulmonary/Chest: Effort normal and breath sounds normal. She exhibits no tenderness.  Abdominal: Soft. Bowel sounds are normal.       L CVA tenderness. Tender to palp over L side of abd. No rebound or guard.  Musculoskeletal: Normal range of motion.  Neurological: She is alert.  Skin: Skin is warm and dry. She is not diaphoretic.  Psychiatric: She has a normal mood and affect.    ED Course  Procedures (including critical care time)  Labs Reviewed  CBC - Abnormal; Notable for the following:    WBC 24.1 (*)     Platelets 125 (*)     All other components within normal limits  DIFFERENTIAL - Abnormal; Notable for the following:    Neutro Abs 17.9 (*)     Lymphocytes Relative 10 (*)     Monocytes Relative 15 (*)     Monocytes Absolute 3.7 (*)     All other components within normal limits  COMPREHENSIVE METABOLIC PANEL - Abnormal; Notable for the following:    Sodium 130 (*)     Calcium 8.3 (*)     Albumin 3.0 (*)     AST 46 (*)     ALT 107 (*)     All other components within normal limits  LIPASE, BLOOD - Abnormal; Notable for the following:    Lipase 10 (*)     All other components within normal limits  URINALYSIS, ROUTINE W REFLEX MICROSCOPIC   No results found.   No diagnosis found.    MDM  Patient presents with persistent nausea and vomiting for the past several days accompanied by left-sided abdominal pain. She has been febrile at home. Labs here significant for hypokalemia to 2.7. This is being repleted. The patient also has a significantly elevated white count of 24,000. Urine has mod leuks but no other evidence for UTI. Based on this and the patient's exam, we will proceed with a CT of abdomen and pelvis. Patient care signed out to Dr. Jeraldine Loots at  shift change.        Grant Fontana, PA-C 04/30/12 1649

## 2012-04-30 NOTE — ED Notes (Signed)
Nutrition Services present at bedside delivering patient a full liquid dinner tray. Ginger ale provided to patient upon request.

## 2012-04-30 NOTE — ED Provider Notes (Signed)
Patient sent to CDU on pyelonephritis protocol to stay in ER until morning for recheck and for ongoing IV fluids and IV abx. If symptomatic improvement in AM and tolerating PO fluids well then home with PO abx and PCP follow up. Will continue monitoring but protocol orders written.   [place], [place] 04/30/12 [id number]

## 2012-04-30 NOTE — ED Notes (Signed)
Vomiting x 3 days; cannot keep water down; yellow, green, clear emesis.

## 2012-04-30 NOTE — ED Notes (Signed)
Generalized aching all over.

## 2012-04-30 NOTE — ED Notes (Signed)
Patient C/O nausea and vomiting that began on Tuesday. States that she came back from vacation on Monday and was feeling bad. States that the vomiting has gotten worse and now is aching all over. C/O having a fever for the past 2 days. C/O her urine being cloudy and mild dysuria.

## 2012-04-30 NOTE — ED Notes (Signed)
Patient's husband Orlene Erm - [phone]

## 2012-04-30 NOTE — Progress Notes (Signed)
Latoya Swanson is a 60 y.o. female who presents to [facility] today for fever, vomiting, left lower quadrant and left flank pain. Patient was recently on vacation where she overindulged in spicy foods and alcohol. A few days after she came back from vacation, (4 days ago) she developed vomiting and abdominal pain. The vomiting has persisted and worsened as has the pain in her belly. She denies any blood in her stool or vomit. Additionally she denies any tick bites but does that she couldn't been exposed to ticks when she worked in her yard. She denies any rashes. She does not a significant headache with vomiting and symptoms neck stiffness as well. She denies any significant dysuria or vaginal discharge.    She complains of significant fever with my nurse today and feels very ill.   PMH: Reviewed hepatitis C History  Substance Use Topics  . Smoking status: Current Everyday Smoker  . Smokeless tobacco: Never Used   Comment: 2-3 cigs a day  . Alcohol Use: No   ROS as above  Medications reviewed. Current Outpatient Prescriptions  Medication Sig Dispense Refill  . amLODipine (NORVASC) 10 MG tablet TAKE 1 TABLET BY MOUTH DAILY  30 tablet  3  . ARIPiprazole (ABILIFY) 2 MG tablet Per mental health       . aspirin 81 MG tablet Take 81 mg by mouth daily.        . diclofenac (VOLTAREN) 75 MG EC tablet Take 1 tablet (75 mg total) by mouth 2 (two) times daily.  60 tablet  3  . FLUoxetine (PROZAC) 20 MG capsule Take 60 mg by mouth daily.        Marland Kitchen gabapentin (NEURONTIN) 300 MG capsule Take 1 capsule (300 mg total) by mouth 3 (three) times daily.  90 capsule  3  . omeprazole (PRILOSEC) 20 MG capsule Take 1 capsule (20 mg total) by mouth daily as needed.  30 capsule  6  . traMADol (ULTRAM) 50 MG tablet TAKE 1 TABLET (50 MG TOTAL) BY MOUTH EVERY 6 (SIX) HOURS AS NEEDED FOR PAIN.  100 tablet  1  . traZODone (DESYREL) 50 MG tablet Take 50 mg by mouth at bedtime as needed. Per Mental Health       .  triamterene-hydrochlorothiazide (MAXZIDE-25) 37.5-25 MG per tablet TAKE 1 TABLET BY MOUTH DAILY.  30 tablet  3    Exam:  BP 129/80  Pulse 93  Temp 101.3 F (38.5 C) (Oral)  Wt 194 lb (87.998 kg)  SpO2 98% Gen: Well but nontoxic-appearing.  HEENT: EOMI,  MMM. No significant meningismus but some mild neck stiffness noted. Lungs: CTABL Nl WOB Heart: RRR no MRG Abd: No rebound tenderness or guarding. Mild left costovertebral angle tenderness. No obvious masses palpated.   Exts: Non edematous BL  LE, warm and well perfused.  \Skin: No obvious rashes noted Neuro: Alert and oriented normally conversant no focality to exam  No results found for this or any previous visit (from the past 72 hour(s)).

## 2012-05-01 ENCOUNTER — Encounter (HOSPITAL_COMMUNITY): Payer: Self-pay | Admitting: Family Medicine

## 2012-05-01 DIAGNOSIS — E876 Hypokalemia: Secondary | ICD-10-CM | POA: Diagnosis present

## 2012-05-01 DIAGNOSIS — N12 Tubulo-interstitial nephritis, not specified as acute or chronic: Secondary | ICD-10-CM | POA: Diagnosis present

## 2012-05-01 LAB — POCT I-STAT, CHEM 8
Calcium, Ion: 1.15 mmol/L (ref 1.12–1.32)
Glucose, Bld: 93 mg/dL (ref 70–99)
HCT: 40 % (ref 36.0–46.0)
Hemoglobin: 13.6 g/dL (ref 12.0–15.0)

## 2012-05-01 MED ORDER — HYDROMORPHONE HCL PF 1 MG/ML IJ SOLN
1.0000 mg | INTRAMUSCULAR | Status: AC | PRN
  Administered 2012-05-01 (×2): 1 mg via INTRAVENOUS
  Filled 2012-05-01 (×2): qty 1

## 2012-05-01 MED ORDER — DEXTROSE 5 % IV SOLN
1.0000 g | INTRAVENOUS | Status: DC
  Administered 2012-05-01 – 2012-05-03 (×3): 1 g via INTRAVENOUS
  Filled 2012-05-01 (×4): qty 10

## 2012-05-01 MED ORDER — ONDANSETRON HCL 4 MG/2ML IJ SOLN
4.0000 mg | Freq: Three times a day (TID) | INTRAMUSCULAR | Status: AC | PRN
  Administered 2012-05-01: 4 mg via INTRAVENOUS

## 2012-05-01 MED ORDER — PROMETHAZINE HCL 25 MG/ML IJ SOLN
12.5000 mg | Freq: Four times a day (QID) | INTRAMUSCULAR | Status: DC | PRN
  Administered 2012-05-01: 25 mg via INTRAVENOUS
  Administered 2012-05-02: 12.5 mg via INTRAVENOUS
  Administered 2012-05-02: 25 mg via INTRAVENOUS
  Filled 2012-05-01 (×3): qty 1

## 2012-05-01 MED ORDER — ASPIRIN 81 MG PO CHEW
81.0000 mg | CHEWABLE_TABLET | Freq: Every day | ORAL | Status: DC
  Administered 2012-05-01 – 2012-05-04 (×4): 81 mg via ORAL
  Filled 2012-05-01 (×4): qty 1

## 2012-05-01 MED ORDER — TRAMADOL HCL 50 MG PO TABS
50.0000 mg | ORAL_TABLET | Freq: Four times a day (QID) | ORAL | Status: DC | PRN
  Administered 2012-05-02 – 2012-05-03 (×2): 50 mg via ORAL
  Filled 2012-05-01 (×2): qty 1

## 2012-05-01 MED ORDER — ARIPIPRAZOLE 2 MG PO TABS
2.0000 mg | ORAL_TABLET | Freq: Every day | ORAL | Status: DC
  Administered 2012-05-01 – 2012-05-04 (×4): 2 mg via ORAL
  Filled 2012-05-01 (×5): qty 1

## 2012-05-01 MED ORDER — ENOXAPARIN SODIUM 40 MG/0.4ML ~~LOC~~ SOLN
40.0000 mg | SUBCUTANEOUS | Status: DC
  Administered 2012-05-01 – 2012-05-03 (×3): 40 mg via SUBCUTANEOUS
  Filled 2012-05-01 (×4): qty 0.4

## 2012-05-01 MED ORDER — ASPIRIN 81 MG PO TABS: 81.0000 mg | ORAL_TABLET | Freq: Every day | ORAL | Status: DC

## 2012-05-01 MED ORDER — SODIUM CHLORIDE 0.9 % IV SOLN
INTRAVENOUS | Status: AC
  Administered 2012-05-01: 15:00:00 via INTRAVENOUS

## 2012-05-01 MED ORDER — ONDANSETRON HCL 4 MG/2ML IJ SOLN
4.0000 mg | Freq: Once | INTRAMUSCULAR | Status: AC
  Administered 2012-05-01: 4 mg via INTRAVENOUS

## 2012-05-01 MED ORDER — TRAZODONE HCL 50 MG PO TABS
50.0000 mg | ORAL_TABLET | Freq: Every evening | ORAL | Status: DC | PRN
  Filled 2012-05-01: qty 1

## 2012-05-01 MED ORDER — GABAPENTIN 300 MG PO CAPS
300.0000 mg | ORAL_CAPSULE | Freq: Three times a day (TID) | ORAL | Status: DC
  Administered 2012-05-01 – 2012-05-04 (×10): 300 mg via ORAL
  Filled 2012-05-01 (×11): qty 1

## 2012-05-01 MED ORDER — SODIUM CHLORIDE 0.9 % IV SOLN
INTRAVENOUS | Status: DC
  Administered 2012-05-01: 125 mL/h via INTRAVENOUS

## 2012-05-01 MED ORDER — FLUOXETINE HCL 20 MG PO CAPS
60.0000 mg | ORAL_CAPSULE | Freq: Every day | ORAL | Status: DC
  Administered 2012-05-01 – 2012-05-04 (×4): 60 mg via ORAL
  Filled 2012-05-01 (×4): qty 3

## 2012-05-01 MED ORDER — PANTOPRAZOLE SODIUM 40 MG IV SOLR
40.0000 mg | INTRAVENOUS | Status: DC
  Administered 2012-05-01 – 2012-05-03 (×3): 40 mg via INTRAVENOUS
  Filled 2012-05-01 (×4): qty 40

## 2012-05-01 NOTE — ED Notes (Signed)
IV infusion complete-1000 at 125/hr.

## 2012-05-01 NOTE — ED Notes (Signed)
Complaining of headache increased with nausea and vomiting. Patient observed vomiting. Will continue to monitor patient

## 2012-05-01 NOTE — ED Provider Notes (Signed)
Patient on CDU Pyelo protocol.  Please see my initial note.  At the conclusion of my shift, Dr. Brooke Dare assumed care of the patient.  Gerhard Munch, MD 05/01/12 [phone]

## 2012-05-01 NOTE — H&P (Signed)
Family Medicine Teaching [facility] Admission History and Physical  Patient name: Latoya Swanson Medical record number: [id number] Date of birth: 31-Dec-1950 Age: 61 y.o. Gender: female  Primary Care Provider: Carney Living, MD  Chief Complaint:  History of Present Illness: Latoya Swanson is a 61 y.o. year old female presenting with one week of  fatigue and decreased appetite which quickly  progressed to L flank pain that radiates forward to the left lower quadrant. Two days later she developed nausea and small volume  non-bloody non-bilious emesis after every meal. She admits to fever with  a Tmax of 103.3 2 days ago. She admits to discomfort with urination. She denies dysuria, she admits to frequency initially but now has low urine output.   She was in her normal state of health and travelling [place] one week prior to the onset of her illness.   Past Medical History  Diagnosis Date  . 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  . Normal echocardiogram 08/19/2007  . Normal cardiac stress test 2008     [name]    Past Surgical History  Procedure Date  . Anal fissure repair   . Tubal ligation   . Carpal tunnel release     bilat  . Shoulder surgery     twice    Family History  Problem Relation Age of Onset  . Coronary artery disease Father     died age 69  . Breast cancer Mother     died age 94  . Colon cancer Mother    Social History:  reports that she quit smoking 6 days ago. Her smoking use included Cigarettes. She has never used smokeless tobacco. She reports that she does not drink alcohol or use illicit drugs.  Allergies: No Known Allergies  Pertinent Labs/Studies:  WBC 24.6m 74 % Neutrophils,, Neutro Abs 17.9  Na 130  K 2.7  CO2 24  Cr 0.73 AST 46, ALT 107  Lipase 10 UA: spGrav 1.008, Neg Hgb, ketones, nitrite. Moderate leukocytes.  Umicro:  7-10 WBC, few bacteria   04/30/2012: Ct Abdomen Pelvis W  Contrast The left kidney appears enlarged and inflamed with abnormal areas of decreased enhancement in the mid and lower pole compatible with pyelonephritis.  No hydronephrosis.  Mild perinephric stranding on the left.  Appendix is visualized and is normal. Bowel grossly unremarkable. No free fluid, free air, or adenopathy.  Calcified fibroid in the uterus.  No adnexal masses.  No acute bony abnormality.  IMPRESSION: Abnormal areas of decreased enhancement in the mid and lower pole of the left kidney compatible with left pyelonephritis.    Review of Systems  Constitutional: Positive for fever, chills, malaise/fatigue and diaphoresis. Negative for weight loss.  HENT: Negative for neck pain.   Eyes: Negative.   Respiratory: Negative.   Cardiovascular: Negative.   Gastrointestinal: Positive for nausea, vomiting and abdominal pain. Negative for heartburn, diarrhea, constipation, blood in stool and melena.  Genitourinary: Positive for dysuria.  Musculoskeletal: Positive for myalgias. Negative for back pain and falls.       Muscle pain in upper thighs x 1 week.   Skin: Negative.   Neurological: Positive for weakness and headaches.  Endo/Heme/Allergies: Negative.   Psychiatric/Behavioral: Negative.    Blood pressure 115/63, pulse 78, temperature 99.7 F (37.6 C), temperature source Oral, resp. rate 16, SpO2 97.00%. Physical Exam  General appearance: alert, cooperative, no distress and resting in ED bed.  Cooperative to exam.  Eyes: conjunctivae/corneas clear. PERRL, EOM's intact.  Ears: normal TM's and external ear canals both ears Throat: Edentulous. Slightly dry mucus membranes.  Back: L CVA tenderness.  Lungs: clear to auscultation bilaterally Heart: regular rate and rhythm, S1, S2 normal, no murmur, click, rub or gallop Abdomen: NABS, obese, mild tenderness to palpation LLQ. No rebound or guarding.  Extremities: extremities normal, atraumatic, no cyanosis or edema. Non tender over thighs.    Pulses: 2+ and symmetric Skin: Skin color, texture, turgor normal. No rashes or lesions Neurologic: Grossly normal  Assessment/Plan 61 yo F with a past medical history significant for HTN, tobacco abuse and hepatitis A and C (latent).  1. Pyelonephritis:  A: Imaging and physical exam findings consistent with L sided pyelonephritis. Patient with persistent pain and nausea despite 24 hrs of IV fluids, IV pain control and antibiotics in the ED. I have considered pancreatitis but lipase was normal. I have also considered a renal stone but there was no stone evident on CT.  P: -Admit to in patient, tele due to hypokalemia.  -continue IV Ceftriaxone  -F/u Urine CG stain and culture  -Continue IV fluids  -Pain and nausea control with IV Dilaudid, tramadol prn moderate pain and IV Zofran and protonix.   2. Hypokalemia: patient with a baseline of hypokalemia. She has received 10 mg of IV KCL x 1 in the ED.  Plan: F/u stat BMET. Replete K with a goal of 3.5    3. Hypertension: patient normotensive during this acute illness. Will hold BP medications for now.   4. 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 Will continue gabapentin.   5. Depression: mood upbeat. Continue Abilify and trazodone.  6. FEN/GI: clear liquid diet and IVFs. See hypokalemia #2.   7. DVT Prophylaxis: lovenox.   8. Dispo: to home pending clinical improvement (patient pain has improved, she is tolerating oral fluids and able to take antibiotic by mouth).    Dessa Phi MD, PGY-2  2:14 PM, 05/02/[phone]

## 2012-05-01 NOTE — ED Notes (Signed)
This rn assessed pt at 0700.  Pt c/o left side abd pain 8/10 and HA 8/10.  Pt ambulatory to RR. Pt sts soft stool.  NAD.  No other complaints at this time.

## 2012-05-01 NOTE — ED Provider Notes (Signed)
History     CSN: [id number]  Arrival date & time 04/30/12  1224   First MD Initiated Contact with Patient 04/30/12 1232      Chief Complaint  Patient presents with  . Emesis    (Consider location/radiation/quality/duration/timing/severity/associated sxs/prior treatment) HPI  Past Medical History  Diagnosis Date  . 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  . Normal echocardiogram 08/19/2007  . Normal cardiac stress test 2008     [name]    Past Surgical History  Procedure Date  . Anal fissure repair   . Tubal ligation   . Carpal tunnel release     bilat  . Shoulder surgery     twice     Family History  Problem Relation Age of Onset  . Coronary artery disease Father     died age 81  . Breast cancer Mother     died age 10  . Colon cancer Mother     History  Substance Use Topics  . Smoking status: Current Everyday Smoker  . Smokeless tobacco: Never Used   Comment: 2-3 cigs a day  . Alcohol Use: No    OB History    Grav Para Term Preterm Abortions TAB SAB Ect Mult Living                  Review of Systems  Allergies  Review of patient's allergies indicates no known allergies.  Home Medications   Current Outpatient Rx  Name Route Sig Dispense Refill  . AMLODIPINE BESYLATE 10 MG PO TABS  TAKE 1 TABLET BY MOUTH DAILY 30 tablet 3  . ARIPIPRAZOLE 2 MG PO TABS Oral Take 2 mg by mouth daily. Per mental health    . ASPIRIN 81 MG PO TABS Oral Take 81 mg by mouth daily.      Marland Kitchen DICLOFENAC SODIUM 75 MG PO TBEC Oral Take 75 mg by mouth 2 (two) times daily.    Marland Kitchen FLUOXETINE HCL 20 MG PO CAPS Oral Take 60 mg by mouth daily.      Marland Kitchen GABAPENTIN 300 MG PO CAPS Oral Take 300 mg by mouth 3 (three) times daily.    . ADULT MULTIVITAMIN W/MINERALS CH Oral Take 1 tablet by mouth daily.    . TRAMADOL HCL 50 MG PO TABS Oral Take 50 mg by mouth every 6 (six) hours as needed. As needed for pain.    . TRAZODONE HCL 50 MG PO TABS Oral Take 50  mg by mouth at bedtime as needed. As needed for sleep.    . TRIAMTERENE-HCTZ 37.5-25 MG PO TABS      . OMEPRAZOLE 20 MG PO CPDR Oral Take 20 mg by mouth daily as needed. As needed for when stomach bothers her.      BP 115/63  Pulse 78  Temp 99.7 F (37.6 C) (Oral)  Resp 16  SpO2 97%  Physical Exam  ED Course  Procedures (including critical care time)  Labs Reviewed  CBC - Abnormal; Notable for the following:    WBC 24.1 (*)     Platelets 125 (*)     All other components within normal limits  DIFFERENTIAL - Abnormal; Notable for the following:    Neutro Abs 17.9 (*)     Lymphocytes Relative 10 (*)     Monocytes Relative 15 (*)     Monocytes Absolute 3.7 (*)     All other components within normal limits  COMPREHENSIVE METABOLIC PANEL - Abnormal; Notable for the following:    Sodium 130 (*)     Potassium 2.7 (*)     Calcium 8.3 (*)     Albumin 3.0 (*)     AST 46 (*)     ALT 107 (*)     All other components within normal limits  LIPASE, BLOOD - Abnormal; Notable for the following:    Lipase 10 (*)     All other components within normal limits  URINALYSIS, ROUTINE W REFLEX MICROSCOPIC - Abnormal; Notable for the following:    Urobilinogen, UA 4.0 (*)     Leukocytes, UA MODERATE (*)     All other components within normal limits  URINE MICROSCOPIC-ADD ON - Abnormal; Notable for the following:    Bacteria, UA FEW (*)     All other components within normal limits   Ct Abdomen Pelvis W Contrast  04/30/2012  *RADIOLOGY REPORT*  Clinical Data: Left abdominal pain, flank pain, fever.  CT ABDOMEN AND PELVIS WITH CONTRAST  Technique:  Multidetector CT imaging of the abdomen and pelvis was performed following the standard protocol during bolus administration of intravenous contrast.  Contrast: OMNIPAQUE IOHEXOL 300 MG/ML  SOLN  Comparison: MRI 02/10/2011  Findings: Linear scarring in the lung bases.  Heart is normal size. No effusions.  Nodular contours of the liver compatible  with cirrhosis.  No focal abnormality seen.  Spleen, pancreas, gallbladder, adrenals and right kidney are unremarkable.  The left kidney appears enlarged and inflamed with abnormal areas of decreased enhancement in the mid and lower pole compatible with pyelonephritis.  No hydronephrosis.  Mild perinephric stranding on the left.  Appendix is visualized and is normal. Bowel grossly unremarkable. No free fluid, free air, or adenopathy.  Calcified fibroid in the uterus.  No adnexal masses.  No acute bony abnormality.  IMPRESSION: Abnormal areas of decreased enhancement in the mid and lower pole of the left kidney compatible with left pyelonephritis.  Original Report Authenticated By: Cyndie Chime, M.D.     1. Pyelonephritis    11:59 AM Patient seen and examined. On pyelo protocol.   Vital signs reviewed and are as follows: Filed Vitals:   05/01/12 1125  BP: 115/63  Pulse: 78  Temp: 99.7 F (37.6 C)  Resp: 16   Patient is still not feeling well. She has left lower back pain and is actively vomiting. She has been in the CDU for 23+ hours. Will call [facility] for admission.  12:00 PM Exam: Gen NAD; Heart RRR, nml S1,S2, no m/r/g; Lungs CTAB; Abd soft, NT, no rebound or guarding, L CVA tenderness; Ext 2+ pedal pulses bilaterally, no edema.  12:38 PM Spoke with [facility] who will see and admit.   MDM  Admit for pyelo.        Renne Crigler, [place] 05/01/12 1239

## 2012-05-01 NOTE — ED Provider Notes (Signed)
Medical screening examination/treatment/procedure(s) were performed by non-physician practitioner and as supervising physician I was immediately available for consultation/collaboration.   Jamicia Haaland, MD 05/01/12 0725 

## 2012-05-02 LAB — CBC
MCV: 93 fL (ref 78.0–100.0)
Platelets: 146 10*3/uL — ABNORMAL LOW (ref 150–400)
RDW: 12.5 % (ref 11.5–15.5)
WBC: 13 10*3/uL — ABNORMAL HIGH (ref 4.0–10.5)

## 2012-05-02 LAB — BASIC METABOLIC PANEL
CO2: 23 mEq/L (ref 19–32)
Calcium: 8.4 mg/dL (ref 8.4–10.5)
Creatinine, Ser: 0.79 mg/dL (ref 0.50–1.10)
GFR calc Af Amer: >90 mL/min (ref >90)

## 2012-05-02 MED ORDER — ONDANSETRON HCL 4 MG/2ML IJ SOLN
4.0000 mg | Freq: Four times a day (QID) | INTRAMUSCULAR | Status: DC | PRN
  Administered 2012-05-02 – 2012-05-03 (×4): 4 mg via INTRAVENOUS
  Filled 2012-05-02 (×4): qty 2

## 2012-05-02 MED ORDER — POTASSIUM CHLORIDE 10 MEQ/100ML IV SOLN
10.0000 meq | Freq: Once | INTRAVENOUS | Status: AC
  Administered 2012-05-02: 10 meq via INTRAVENOUS
  Filled 2012-05-02: qty 100

## 2012-05-02 MED ORDER — POTASSIUM CHLORIDE 20 MEQ/15ML (10%) PO LIQD
40.0000 meq | Freq: Two times a day (BID) | ORAL | Status: AC
  Administered 2012-05-02 (×2): 40 meq via ORAL
  Filled 2012-05-02 (×2): qty 30

## 2012-05-02 MED ORDER — HYDROMORPHONE HCL PF 1 MG/ML IJ SOLN
0.5000 mg | INTRAMUSCULAR | Status: DC | PRN
  Administered 2012-05-02 – 2012-05-04 (×7): 0.5 mg via INTRAVENOUS
  Filled 2012-05-02 (×7): qty 1

## 2012-05-02 NOTE — Progress Notes (Signed)
FMTS Attending Daily Note: Antrice Pal MD [phone] pager office [phone] I  have seen and examined this patient, reviewed their chart. I have discussed this patient with the resident. I agree with the resident's findings, assessment and care plan. 

## 2012-05-02 NOTE — H&P (Signed)
FMTS Attending Admission Note: Anes Rigel MD [phone] pager office [phone] I  have seen and examined this patient, reviewed their chart. I have discussed this patient with the resident. I agree with the resident's findings, assessment and care plan. 

## 2012-05-02 NOTE — Progress Notes (Signed)
Subjective: Interval History: has complaints persistent yet improved L abdominal pain that radiates towards the pelvis. Also with some nausea. Able to keep down liquids but no solids. Also complains of headache.   Objective: Vital signs in last 24 hours: Temp:  [97.9 F (36.6 C)-100.9 F (38.3 C)] 100.9 F (38.3 C) (06/22 2100) Pulse Rate:  [78-88] 82  (06/22 2100) Resp:  [16-22] 18  (06/22 2100) BP: (111-145)/(63-82) 120/75 mmHg (06/22 2100) SpO2:  [96 %-100 %] 96 % (06/22 2100)  Intake/Output from previous day: 06/22 0701 - 06/23 0700 In: 5662.1 [P.O.:360; I.V.:5302.1] Out: -  Intake/Output this shift: Total I/O In: 1495 [P.O.:120; I.V.:1375] Out: -   General appearance: alert, cooperative and mild distress Lungs: clear to auscultation bilaterally Heart: regular rate and rhythm, S1, S2 normal, no murmur, click, rub or gallop Abdomen: NABS, obeses, tender on L side of abdomen without rebound or guarding.  Extremities: extremities normal, atraumatic, no cyanosis or edema  Pertinent Labs/Studies:  WBC 24.1 > 13 Plt 125 > 146 Na 130  K 2.7 > 3.5 > 3.1    UA: spGrav 1.008, Neg Hgb, ketones, nitrite. Moderate leukocytes.  Umicro: 7-10 WBC, few bacteria  Urine culture-pending   04/30/2012: Ct Abdomen Pelvis W Contrast  The left kidney appears enlarged and inflamed with abnormal areas of decreased enhancement in the mid and lower pole compatible with pyelonephritis. No hydronephrosis. Mild perinephric stranding on the left. Appendix is visualized and is normal. Bowel grossly unremarkable. No free fluid, free air, or adenopathy. Calcified fibroid in the uterus. No adnexal masses. No acute bony abnormality. IMPRESSION: Abnormal areas of decreased enhancement in the mid and lower pole of the left kidney compatible with left pyelonephritis.   Scheduled Meds:   . sodium chloride   Intravenous STAT  . ARIPiprazole  2 mg Oral Daily  . aspirin  81 mg Oral Daily  . cefTRIAXone  (ROCEPHIN)  IV  1 g Intravenous Q24H  . enoxaparin  40 mg Subcutaneous Q24H  . FLUoxetine  60 mg Oral Daily  . gabapentin  300 mg Oral TID  . ondansetron (ZOFRAN) IV  4 mg Intravenous Once  . pantoprazole (PROTONIX) IV  40 mg Intravenous Q24H  . DISCONTD: aspirin  81 mg Oral Daily   Continuous Infusions:   . sodium chloride 1,000 mL (05/01/12 1522)  . DISCONTD: sodium chloride 125 mL/hr (05/01/12 1205)   PRN Meds:HYDROmorphone (DILAUDID) injection, ondansetron (ZOFRAN) IV, promethazine, traMADol, traZODone, DISCONTD:  HYDROmorphone (DILAUDID) injection, DISCONTD: ondansetron  Assessment/Plan: 61 yo F with a past medical history significant for HTN, tobacco abuse and hepatitis A and C (latent).  1. Pyelonephritis:  A: Improving slowly.  Imaging and physical exam findings consistent with L sided pyelonephritis. Patient with persistent pain and nausea this AM.   P:  -continue IV Ceftriaxone  -F/u Urine culture. The urine for culture will have to be recollected this AM as (per the lab) one cannot add on a culture to a urine collected > 24 hrs prior and she was in the CDU for > 24 hrs.  -Continue IV fluids  -Pain and nausea control with IV Dilaudid, tramadol prn moderate pain and IV Zofran and protonix.   2. Hypokalemia: patient with a baseline of hypokalemia. She has received 10 mg of IV KCL x 1 in the ED. Her potassium improved but is down trending this AM.  Plan: replete with 1 run of KCL IV as patient not is tolerating oral well.   3. Hypertension: patient normotensive  during this acute illness. Will hold BP medications for now.   4. 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 Will continue gabapentin.   5. Depression: mood upbeat. Continue Abilify and trazodone.   6. FEN/GI: clear liquid diet and IVFs. See hypokalemia #2.   7. DVT Prophylaxis: lovenox.   8. Dispo: to home pending clinical improvement (patient pain has improved, she is tolerating oral fluids and able to take  antibiotic by mouth).    LOS: 2 days   Dessa Phi MD, PGY-2 [phone]

## 2012-05-02 NOTE — ED Provider Notes (Signed)
Medical screening examination/treatment/procedure(s) were performed by non-physician practitioner and as supervising physician I was immediately available for consultation/collaboration.    Thaer Miyoshi L Keelan Tripodi, MD 05/02/12 0710 

## 2012-05-03 LAB — BASIC METABOLIC PANEL
Chloride: 102 mEq/L (ref 96–112)
GFR calc Af Amer: >90 mL/min (ref >90)
GFR calc non Af Amer: >90 mL/min (ref >90)
Potassium: 3.4 mEq/L — ABNORMAL LOW (ref 3.5–5.1)
Sodium: 137 mEq/L (ref 135–145)

## 2012-05-03 LAB — CBC
MCHC: 34.6 g/dL (ref 30.0–36.0)
Platelets: 159 10*3/uL (ref 150–400)
RDW: 12.5 % (ref 11.5–15.5)
WBC: 9.2 10*3/uL (ref 4.0–10.5)

## 2012-05-03 MED ORDER — SODIUM CHLORIDE 0.9 % IV SOLN: 1000.0000 mL | INTRAVENOUS | Status: DC

## 2012-05-03 MED ORDER — POTASSIUM CHLORIDE 20 MEQ/15ML (10%) PO LIQD
40.0000 meq | Freq: Two times a day (BID) | ORAL | Status: AC
  Administered 2012-05-03 (×2): 40 meq via ORAL
  Filled 2012-05-03 (×2): qty 30

## 2012-05-03 NOTE — Progress Notes (Signed)
UR Completed Sharita Bienaime Graves-Bigelow, RN,BSN [phone]  

## 2012-05-03 NOTE — Progress Notes (Signed)
I interviewed and examined this patient and discussed the care plan with Dr. Elwyn Reach and the [facility] team and agree with assessment and plan as documented in the progress note for today.    Latoya Swanson A. Sheffield Slider, MD Family Medicine Teaching Service Attending  05/03/2012 3:27 PM

## 2012-05-03 NOTE — Progress Notes (Signed)
Subjective: Interval History: Improved L abdominal pain that radiates towards the pelvis. Also with some nausea. Able to keep down liquids but no solids. Wants to wait to try oral pain meds until tolerating food better.  Objective: Vital signs in last 24 hours: Temp:  [98.5 F (36.9 C)-99.9 F (37.7 C)] 98.5 F (36.9 C) (06/24 0600) Pulse Rate:  [63-73] 64  (06/24 0600) Resp:  [16-18] 18  (06/24 0600) BP: (126-131)/(70-79) 131/77 mmHg (06/24 0600) SpO2:  [95 %-100 %] 95 % (06/24 0600)  Intake/Output from previous day: 06/23 0701 - 06/24 0700 In: 1220 [P.O.:720; I.V.:500] Out: -  Intake/Output this shift:    General appearance: alert, cooperative and mild distress Lungs: clear to auscultation bilaterally Heart: regular rate and rhythm, S1, S2 normal, no murmur, click, rub or gallop Abdomen: NABS, obeses, tender on L side of abdomen without rebound or guarding.  Back: mild left CVA tenderness Extremities: extremities normal, atraumatic, no cyanosis or edema  Pertinent Labs/Studies:  WBC 24.1 > 13 Plt 125 > 146 Na 130  K 2.7 > 3.5 > 3.1    UA: spGrav 1.008, Neg Hgb, ketones, nitrite. Moderate leukocytes.  Umicro: 7-10 WBC, few bacteria  Urine culture-pending   04/30/2012: Ct Abdomen Pelvis W Contrast  The left kidney appears enlarged and inflamed with abnormal areas of decreased enhancement in the mid and lower pole compatible with pyelonephritis. No hydronephrosis. Mild perinephric stranding on the left. Appendix is visualized and is normal. Bowel grossly unremarkable. No free fluid, free air, or adenopathy. Calcified fibroid in the uterus. No adnexal masses. No acute bony abnormality. IMPRESSION: Abnormal areas of decreased enhancement in the mid and lower pole of the left kidney compatible with left pyelonephritis.   Assessment/Plan: 61 yo F with a past medical history significant for HTN, tobacco abuse and hepatitis A and C (latent).  1. Pyelonephritis:  A: Improving  slowly.  Imaging and physical exam findings consistent with L sided pyelonephritis. Patient with persistent pain and nausea this AM, although improved.   P:  -continue IV Ceftriaxone  -F/u Urine culture.  - IVF to [facility] -Pain and nausea control with IV Dilaudid, tramadol prn moderate pain and IV Zofran and protonix.  - will likely switch to oral pain medicines tomorrow  2. Hypokalemia: patient with a baseline of hypokalemia.  - replete with 40 KDur x2  3. Hypertension: patient normotensive during this acute illness. Will hold BP medications for now.   4. 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 Will continue gabapentin.   5. Depression: mood upbeat. Continue Abilify and trazodone.   6. FEN/GI: clear liquid diet and IVFs. See hypokalemia #2.   7. DVT Prophylaxis: lovenox.   8. Dispo: to home pending clinical improvement (patient pain has improved, she is tolerating oral fluids and able to take antibiotic by mouth).    LOS: 3 days   BOOTH, Denny Peon MD PGY-1 [phone]

## 2012-05-03 NOTE — Care Management Note (Unsigned)
    Page 1 of 1   05/03/2012     4:09:55 PM   CARE MANAGEMENT NOTE 05/03/2012  Patient:  Latoya Swanson, Latoya Swanson   Account Number:  [id number]  Date Initiated:  05/03/2012  Documentation initiated by:  GRAVES-BIGELOW,Nyree Yonker  Subjective/Objective Assessment:   Pt admitted with pyelonephritis. Plan for d/c home when medically stable.     Action/Plan:   CM will continue to monitor for disposition needs.   Anticipated DC Date:  05/04/2012   Anticipated DC Plan:  HOME/SELF CARE      DC Planning Services  CM consult      Choice offered to / List presented to:             Status of service:  In process, will continue to follow Medicare Important Message given?   (If response is "NO", the following Medicare IM given date fields will be blank) Date Medicare IM given:   Date Additional Medicare IM given:    Discharge Disposition:    Per UR Regulation:  Reviewed for med. necessity/level of care/duration of stay  If discussed at [place] Length of Stay Meetings, dates discussed:    Comments:

## 2012-05-04 LAB — BASIC METABOLIC PANEL
CO2: 26 mEq/L (ref 19–32)
Chloride: 102 mEq/L (ref 96–112)
Creatinine, Ser: 0.75 mg/dL (ref 0.50–1.10)
GFR calc Af Amer: >90 mL/min (ref >90)
Sodium: 137 mEq/L (ref 135–145)

## 2012-05-04 LAB — URINE CULTURE: Culture  Setup Time: 201306231821

## 2012-05-04 MED ORDER — OXYCODONE-ACETAMINOPHEN 5-325 MG PO TABS
1.0000 | ORAL_TABLET | ORAL | Status: DC | PRN
  Administered 2012-05-04: 1 via ORAL
  Filled 2012-05-04: qty 1

## 2012-05-04 MED ORDER — ONDANSETRON HCL 4 MG PO TABS
4.0000 mg | ORAL_TABLET | Freq: Three times a day (TID) | ORAL | Status: DC | PRN
  Administered 2012-05-04: 4 mg via ORAL
  Filled 2012-05-04: qty 1

## 2012-05-04 MED ORDER — OXYCODONE-ACETAMINOPHEN 5-325 MG PO TABS: 1.0000 | ORAL_TABLET | ORAL | Status: AC | PRN

## 2012-05-04 MED ORDER — ONDANSETRON HCL 4 MG PO TABS: 4.0000 mg | ORAL_TABLET | Freq: Three times a day (TID) | ORAL | Status: AC | PRN

## 2012-05-04 MED ORDER — CEPHALEXIN 500 MG PO CAPS
500.0000 mg | ORAL_CAPSULE | Freq: Four times a day (QID) | ORAL | Status: DC
Start: 2012-05-04 — End: 2012-05-04
  Administered 2012-05-04 (×2): 500 mg via ORAL
  Filled 2012-05-04 (×4): qty 1

## 2012-05-04 MED ORDER — PANTOPRAZOLE SODIUM 40 MG PO TBEC
40.0000 mg | DELAYED_RELEASE_TABLET | Freq: Every day | ORAL | Status: DC
  Administered 2012-05-04: 40 mg via ORAL
  Filled 2012-05-04: qty 1

## 2012-05-04 MED ORDER — CEPHALEXIN 500 MG PO CAPS: 500.0000 mg | ORAL_CAPSULE | Freq: Four times a day (QID) | ORAL | Status: AC

## 2012-05-04 NOTE — Progress Notes (Signed)
I interviewed and examined this patient and discussed the care plan with Dr. Elwyn Reach and the [facility] team and agree with assessment and plan as documented in the progress note for today.    Tequia Wolman A. Sheffield Slider, MD Family Medicine Teaching Service Attending  05/04/2012 3:29 PM

## 2012-05-04 NOTE — Discharge Summary (Signed)
Discharge Summary 05/04/2012 9:55 AM  Latoya Swanson DOB: 1951/09/22 MRN: [id number]  Date of Admission: 04/30/2012 Date of Discharge: 05/04/2012  PCP: Carney Living, MD Consultants: none  Reason for Admission: pyelonephritis  Discharge Diagnosis Primary 1. Pyelonephritis Secondary 1. HTN 2. Hypokalemia 3. Neuropathy 4. Depression  Hospital Course: 61 yo F with a past medical history significant for HTN, tobacco abuse and hepatitis A and C (latent) here with left sided pyelonephritis by CT scan.  1. Pyelonephritis.  Diagnosed by CT scan.  UA with moderate LE.   Febrile in the ED to 101.3 with a white count of 24. Started on IV ceftriaxone with gradual improvement in symptoms of nausea and pain.  Unfortunately, a urine culture was not obtained until >24 hours after antibiotics were started, so we do not have an organism or sensitivities.  The most likely organism is E coli given that she has no history of instrumentation.  She received 4 doses of IV ceftriaxone. She remained afebrile after admission and her white count trending down to normal.  Once her PO intake improved, she was transitioned to Keflex 500mg  four times daily and percocet 5-325 q4hr prn for pain.  She tolerated this regimen well prior to discharge.   2. 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  This was repleted as necessary and is normal at 4.2 at discharge.  3. Hypertension: As she was normotensive during this admission, her home anti-hypertensives were held.  These were restarted at discharge.  4. Neuropathy: Secondary to DDD and cervical radiculopathy.  Continued on home gabapentin.   5. Depression: Stable this admission.  Continued on home abilify and trazodone.   Procedures: none  Discharge Medications Medication List  As of 05/04/2012  9:55 AM   START taking these medications         cephALEXin 500 MG capsule   Commonly known as: KEFLEX   Take 1 capsule (500 mg total) by mouth 4  (four) times daily.      ondansetron 4 MG tablet   Commonly known as: ZOFRAN   Take 1 tablet (4 mg total) by mouth every 8 (eight) hours as needed.      oxyCODONE-acetaminophen 5-325 MG per tablet   Commonly known as: PERCOCET   Take 1 tablet by mouth every 4 (four) hours as needed.         CONTINUE taking these medications         amLODipine 10 MG tablet   Commonly known as: NORVASC   TAKE 1 TABLET BY MOUTH DAILY      ARIPiprazole 2 MG tablet   Commonly known as: ABILIFY      aspirin 81 MG tablet      diclofenac 75 MG EC tablet   Commonly known as: VOLTAREN      FLUoxetine 20 MG capsule   Commonly known as: PROZAC      gabapentin 300 MG capsule   Commonly known as: NEURONTIN      multivitamin with minerals Tabs      omeprazole 20 MG capsule   Commonly known as: PRILOSEC      traMADol 50 MG tablet   Commonly known as: ULTRAM      traZODone 50 MG tablet   Commonly known as: DESYREL      triamterene-hydrochlorothiazide 37.5-25 MG per tablet   Commonly known as: MAXZIDE-25          Where to get your medications    These are the prescriptions that  you need to pick up. We sent them to a specific pharmacy, so you will need to go there to get them.   [id number] [place], [place] - [address]    [address] [place] [address]    Phone: [phone]        cephALEXin 500 MG capsule   ondansetron 4 MG tablet         You may get these medications from any pharmacy.         oxyCODONE-acetaminophen 5-325 MG per tablet            Pertinent Hospital Labs  Lab 05/03/12 0630 05/02/12 0426 05/01/12 1334 04/30/12 1335  WBC 9.2 13.0* -- 24.1*  HGB 13.0 12.2 13.6 --  HCT 37.6 35.6* 40.0 --  PLT 159 146* -- 125*    Lab 05/04/12 0549 05/03/12 0630 05/02/12 0426  NA 137 137 135  K 4.2 3.4* 3.1*  CL 102 102 102  CO2 26 24 23   BUN 8 5* 9  CREATININE 0.75 0.61 0.79  LABGLOM -- -- --  GLUCOSE 92 -- --  CALCIUM 8.9 8.7 8.4    Lipase: 10 UA: moderate LE UCx: NG  CT Abd/pelvis Abnormal areas of decreased enhancement in the mid and lower pole  of the left kidney compatible with left pyelonephritis.   Discharge instructions: see AVS  Condition at discharge: stable  Disposition: home  Pending Tests: none  Follow up: Follow-up Information    Follow up with Carney Living, MD on 05/24/2012. (at 11am)    Contact information:   [address] [place] [address] [phone]          Follow up Issues:  1. Continued improved on oral antibiotics.  No sensitivities as urine culture was collected >24 hours after antibiotics started.  BOOTH, ERIN 05/04/2012, 11:23 AM  Finalized by Tana Conch, MD 05/04/2012 1:52 PM

## 2012-05-04 NOTE — Progress Notes (Signed)
Subjective: Interval History: Improved L abdominal pain; no longer radiating into pelvis.  PO intake and nausea improved.  Ready to try oral medicines this morning.    Objective: Vital signs in last 24 hours: Temp:  [98.1 F (36.7 C)-99.2 F (37.3 C)] 98.4 F (36.9 C) (06/25 0500) Pulse Rate:  [59-72] 59  (06/25 0500) Resp:  [18-20] 18  (06/25 0500) BP: (124-128)/(71-83) 128/81 mmHg (06/25 0500) SpO2:  [97 %-98 %] 98 % (06/25 0500)  Intake/Output from previous day: 06/24 0701 - 06/25 0700 In: 1307 [P.O.:1080; I.V.:227] Out: 452 [Urine:451] Intake/Output this shift:    General appearance: alert, cooperative and no distress Lungs: clear to auscultation bilaterally Heart: regular rate and rhythm, S1, S2 normal, no murmur, click, rub or gallop Abdomen: NABS, obeses, improved tenderness on L side of abdomen without rebound or guarding.  Back: no CVA tenderness Extremities: extremities normal, atraumatic, no cyanosis or edema  Pertinent Labs/Studies:   Lab 05/03/12 0630 05/02/12 0426 05/01/12 1334 04/30/12 1335  WBC 9.2 13.0* -- 24.1*  HGB 13.0 12.2 13.6 --  HCT 37.6 35.6* 40.0 --  PLT 159 146* -- 125*    Lab 05/04/12 0549 05/03/12 0630 05/02/12 0426  NA 137 137 135  K 4.2 3.4* 3.1*  CL 102 102 102  CO2 26 24 23   BUN 8 5* 9  CREATININE 0.75 0.61 0.79  LABGLOM -- -- --  GLUCOSE 92 -- --  CALCIUM 8.9 8.7 8.4    UA: spGrav 1.008, Neg Hgb, ketones, nitrite. Moderate leukocytes.  Umicro: 7-10 WBC, few bacteria  Urine culture-NG   Assessment/Plan: 61 yo F with a past medical history significant for HTN, tobacco abuse and hepatitis A and C (latent).  1. Pyelonephritis:  A: Improving slowly.  Imaging and physical exam findings consistent with L sided pyelonephritis. Symptoms continue to improve.  Urine culture with no growth.  P:  - switch to PO Keflex 500mg  QID  - SLIV - switch to Percocet 5-325 q4hr prn for pain - PO zofran for nausea   2. Hypokalemia: Patient with a  baseline of hypokalemia.  Resolved  3. Hypertension: patient normotensive during this acute illness. Will hold BP medications for now.   4. 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 Will continue gabapentin.   5. Depression: mood upbeat. Continue Abilify and trazodone.   6. FEN/GI: clear liquid diet and IVFs. See hypokalemia #2.   7. DVT Prophylaxis: lovenox.   8. Dispo: Likely this afternoon if tolerates oral antibiotic   LOS: 4 days   BOOTH, Denny Peon MD PGY-1 [phone]

## 2012-05-04 NOTE — Discharge Summary (Signed)
I interviewed and examined this patient and discussed the care plan with Dr. Elwyn Reach and the [facility] team and agree with assessment and plan as documented in the discharge note for today.    Quinlee Sciarra A. Sheffield Slider, MD Family Medicine Teaching Service Attending  05/04/2012 4:45 PM

## 2012-05-04 NOTE — Discharge Instructions (Signed)
Pyelonephritis, Adult Pyelonephritis is a kidney infection. A kidney infection can happen quickly, or it can last for a Schliep time. HOME CARE   Take your medicine (antibiotics) as told. Finish it even if you start to feel better.   Keep all doctor visits as told.   Drink enough fluids to keep your pee (urine) clear or pale yellow.   Only take medicine as told by your doctor.  GET HELP RIGHT AWAY IF:   You have a fever.   You cannot take your medicine or drink fluids as told.   You have chills and shaking.   You feel very weak or pass out (faint).   You do not feel better after 2 days.  MAKE SURE YOU:  Understand these instructions.   Will watch your condition.   Will get help right away if you are not doing well or get worse.  Document Released: 12/04/2004 Document Revised: 10/16/2011 Document Reviewed: 04/16/2011 Exit[employer] Patient Information 2012 [employer]. 

## 2012-05-06 ENCOUNTER — Ambulatory Visit (INDEPENDENT_AMBULATORY_CARE_PROVIDER_SITE_OTHER): Payer: No Typology Code available for payment source | Admitting: Gastroenterology

## 2012-05-06 DIAGNOSIS — K7460 Unspecified cirrhosis of liver: Secondary | ICD-10-CM

## 2012-05-06 DIAGNOSIS — B182 Chronic viral hepatitis C: Secondary | ICD-10-CM

## 2012-05-06 NOTE — Patient Instructions (Addendum)
When you go to [facility] next week, please discuss the possibility of being treated for your hepatitis C with interferon based therapies and discuss the possibility of worsening depression with your therapist.  The therapist may communicate with me by send me notes at fax [phone].  I think it is best to hold off on treating you with hepatitis C medications until a new medication, sofosbuvir, comes out in December.  So I will see you next year.

## 2012-05-18 ENCOUNTER — Other Ambulatory Visit: Payer: Self-pay | Admitting: Family Medicine

## 2012-05-24 ENCOUNTER — Encounter: Payer: Self-pay | Admitting: Family Medicine

## 2012-05-24 ENCOUNTER — Ambulatory Visit (INDEPENDENT_AMBULATORY_CARE_PROVIDER_SITE_OTHER): Payer: No Typology Code available for payment source | Admitting: Family Medicine

## 2012-05-24 VITALS — BP 147/87 | HR 79 | Temp 98.3°F | Ht 65.0 in | Wt 196.0 lb

## 2012-05-24 DIAGNOSIS — M5412 Radiculopathy, cervical region: Secondary | ICD-10-CM

## 2012-05-24 DIAGNOSIS — N12 Tubulo-interstitial nephritis, not specified as acute or chronic: Secondary | ICD-10-CM

## 2012-05-24 DIAGNOSIS — Z720 Tobacco use: Secondary | ICD-10-CM

## 2012-05-24 DIAGNOSIS — F17200 Nicotine dependence, unspecified, uncomplicated: Secondary | ICD-10-CM

## 2012-05-24 MED ORDER — CYCLOBENZAPRINE HCL 5 MG PO TABS: 5.0000 mg | ORAL_TABLET | Freq: Three times a day (TID) | ORAL | Status: DC | PRN

## 2012-05-24 NOTE — Assessment & Plan Note (Signed)
Her neck back pain today is likely a mild exacerbation.  Will treat with msucle relaxer and local massage

## 2012-05-24 NOTE — Progress Notes (Signed)
  Subjective:    Patient ID: Latoya Swanson, female    DOB: April 30, 1951, 61 y.o.   MRN: [id number]  HPI  Pyelonephritis Still feels slightly tired other wise well.  No flank pain or fevers or dysuria  Upper back spasms Has aching tighness at base of neck for last few days.  No trauma.  Husband massages which helps.  No radiation or weakness.    Review of Symptoms - see HPI  PMH - Smoking status noted.      Review of Systems     Objective:   Physical Exam Mild tenderness with moderate muscle tightness bilaterally base of neck. No radiation with head movement Normal UE strength No CVAT       Assessment & Plan:

## 2012-05-24 NOTE — Patient Instructions (Addendum)
Check your blood pressure regularly if most of the time >N 140/90 then call us  Use the flexaril as needed for muscle spasm do not take with tramdol or trazadone  Alternate heat and cold and masage for the neck   Bring all your medications next visit  Congrats on the smoking

## 2012-05-24 NOTE — Assessment & Plan Note (Signed)
Resolved without problems

## 2012-05-27 NOTE — Progress Notes (Signed)
NAMESHEROL, Latoya Swanson  MR#:  [id number]      DATE:  05/06/2012  DOB:  25-Nov-1950    cc: Consulting Physician: [facility] [address] [place], [place] [address], Phone [phone]  Primary Care Physician:  Same  Referring Physician: Estill Batten. 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   HISTORY:  The patient returns today unaccompanied. She was last seen on 12/27/2009. She was supposed to be seen again in 6 months' time. Notes indicate an attempt to arrange an endoscopy to screen for varices after that appointment, but she could no longer be reached at the numbers that they had at that time. Further attempts to reach the patient on numbers that she subsequently gave them were unsuccessful, and she was not seen again.   The patient has been sent back to discuss hepatitis C. There are no symptoms referable to her history of hepatitis C nor are there symptoms to suggest cryoglobulin mediated or decompensated liver disease.   PAST MEDICAL HISTORY:  She was recently hospitalized in [facility] for an episode of pyelonephritis. There is a history of depression. The patient [place] sees a Dr. Ladona Ridgel at [facility] on [facility] every 3 months for monitoring, but I do not know whether this is a physician or a therapist.   CURRENT MEDICATIONS:  1. Amlodipine 10 mg daily.  2. Abilify 2 mg daily.  3. Aspirin 81 mg daily.  4. She has recently completed a course of cephalexin 500 mg q.i.d. for urinary tract infection.  5. Diclofenac 75 mg b.i.d.  6. Fluoxetine 60 mg daily.  7. Gabapentin 300 mg t.i.d.  8. Multivitamin once daily.  9. Omeprazole 20 mg daily p.r.n. for gastrointestinal upset.   10. Zofran 4 mg p.r.n.  11. Oxycodone/acetaminophen 5/325 mg q. 4 hours p.r.n.  12. Tramadol 50 mg q. 6 hours p.r.n.  13. Trazodone 50 mg at bedtime p.r.n.   14. Triamterene/hydrochlorothiazide 37.5/25 mg daily.    ALLERGIES:  NONE KNOWN.   HABITS:  Quit smoking last Sunday. Alcohol denies interval consumption, since last being seen.   REVIEW OF SYSTEMS:  All 10 systems reviewed today with the patient and they are negative other than which is mentioned above. Her CES-D was 33.   PHYSICAL EXAMINATION:  Constitutional: Stated age with some central obesity. Vital Signs: Height 65 inches, weight 197 pounds, blood pressure 121/87, pulse 66, temperature 96.5 Fahrenheit. Ears, Nose, Mouth and Throat:  Unremarkable oropharynx.  No thyromegaly or neck masses.  Chest:  Resonant to percussion.  Clear to auscultation.  Cardiovascular:  Heart sounds normal S1, S2 without murmurs or rubs.  There is no peripheral edema.  Abdomen:  Normal bowel sounds.  No masses or tenderness.  I could not appreciate a liver edge or spleen tip.  I could not appreciate any hernias.  Lymphatics:  No cervical or inguinal lymphadenopathy.  Central Nervous System:  No asterixis or focal neurologic findings.  Dermatologic:  Anicteric without palmar erythema or spider angiomata.  Eyes:  Anicteric sclerae.  Pupils are equal and reactive to light.  LABORATORIES:  Previous labs from her hospitalization at [facility] for the pyelonephritis were reviewed, including a CBC from 05/03/2012, and a CMP from 04/30/2012.   Most recent imaging was a CT scan of the abdomen and pelvis with contrast on 04/30/2012, showing a cirrhotic liver, but no description of portal hypertension  and no ascites.   ASSESSMENT:  The patient is a 61 year old woman with a history of genotype 1a hepatitis C with a liver biopsy on 01/03/2009, showing grade 2 stage IV disease without significant iron deposition. She is naive to treatment.   In terms of her cirrhosis care, she was vaccinated against hepatitis A and B in the past according to the records received previously from [facility]. There is no ascites or encephalopathy  to treat. She has never bled from varices and has a platelet count sufficiently elevated. She does not need screening for varices. She has been immunized against hepatitis A and B by report. Last imaging of her liver was 04/30/2012, and would be sufficient until June 2014.   In terms of treating the patient's hepatitis C, her synthetic function has been confirmed by lab tests while hospitalized for the infection, suggest her liver function is preserved despite the presence of cirrhosis on her liver biopsy by imaging. I suggest she wait until the availability of a combination of peginterferon and ribavirin and a polymerase inhibitor, sofosbuvir. This will allow at best for a 37-month course of therapy with a 90% sustained virological response. To be sure, as a cirrhotic African-American this will likely be less but at least therapy would be better tolerated.   In my discussion today with the patient, I discussed the previous lab results. We discussed waiting until the availability of sofosbuvir. She was very much in agreement with that.   Of course I would need to have the documentation from her therapist regarding stability of her psychiatric condition and compliance.   PLAN:  1. I have not drawn any labs today as is not necessary. She has had recent labs done.  2. Repeat CT scan imaging of the liver in June 2014.  3. No need for endoscopy provided platelet count remains over 100,000 and no history of variceal bleeding.  4. Hepatitis A and B vaccination completed in the past, according to the records I received.  5. I have given her written instructions to talk to her therapist regarding the stability of her psychiatric condition, and her ability to tolerate hepatitis C therapy. She has an appointment to see her therapist at [facility], next Monday, 05/10/2012. By way of this note, I would ask for any supporting documents to be faxed to [phone].  6. She is due to be seen again in approximately January  2014 to discuss the therapeutic options at that time.               Brooke Dare, MD   [phone]  D:  Thu Jun 27 17:18:28 2013 ; T:  Thu Jun 27 20:58:19 2013  Job #:  [id number]

## 2012-07-14 ENCOUNTER — Other Ambulatory Visit: Payer: Self-pay | Admitting: Family Medicine

## 2012-08-18 ENCOUNTER — Ambulatory Visit (INDEPENDENT_AMBULATORY_CARE_PROVIDER_SITE_OTHER): Payer: No Typology Code available for payment source | Admitting: Family Medicine

## 2012-08-18 ENCOUNTER — Encounter: Payer: Self-pay | Admitting: Family Medicine

## 2012-08-18 VITALS — BP 137/87 | HR 76 | Temp 98.5°F | Ht 65.0 in | Wt 201.0 lb

## 2012-08-18 DIAGNOSIS — M19019 Primary osteoarthritis, unspecified shoulder: Secondary | ICD-10-CM

## 2012-08-18 DIAGNOSIS — Z23 Encounter for immunization: Secondary | ICD-10-CM

## 2012-08-18 DIAGNOSIS — E663 Overweight: Secondary | ICD-10-CM

## 2012-08-18 MED ORDER — LIDOCAINE HCL (PF) 1 % IJ SOLN: 3.0000 mL | Freq: Once | INTRAMUSCULAR | Status: DC

## 2012-08-18 MED ORDER — METHYLPREDNISOLONE SODIUM SUCC 40 MG IJ SOLR: 40.0000 mg | Freq: Once | INTRAMUSCULAR | Status: DC

## 2012-08-18 NOTE — Progress Notes (Signed)
  Subjective:    Patient ID: Latoya Swanson, female    DOB: April 25, 1951, 61 y.o.   MRN: [id number]  HPI  R shoulder Pain worsening over the last few weeks.  No specific trauma or overuse.  Has happened before and injections has helped.  No fever or redness or weakness  PMH - had 2 previous surgeries on R shoulder  Review of Systems     Objective:   Physical Exam  R shoulder decreased range of motion esp with internal rotation due to pain Distal strength and sensation is intact Pain is diffuse with palpation but most pronounced posteriorally  Procedure After consent and time out R shoulder injected in sterile manner with 40 mg solumedrol and 3 cc of lidocaine Tolerated well      Assessment & Plan:

## 2012-08-18 NOTE — Patient Instructions (Addendum)
Use ice on your shoulder for next 2 days.  If any fever or redness call me  Take diclofenac twice daily for the next 3 days every day  Make an appointment to see Latoya Swanson our health coach

## 2012-08-18 NOTE — Addendum Note (Signed)
Addended by: Jone Baseman D on: 08/18/2012 12:25 PM   Modules accepted: Orders

## 2012-08-18 NOTE — Assessment & Plan Note (Signed)
Worsened.  Had grandchildren this summer and got off her diet.   Will have her see our health coach for wellness visit

## 2012-08-18 NOTE — Assessment & Plan Note (Signed)
Flare of chronic degenerative joint disease.  Last injection about 10 mo ago.  Repeated today.   Continue nsaid and gabapentin

## 2012-08-24 ENCOUNTER — Ambulatory Visit: Payer: No Typology Code available for payment source | Admitting: Home Health Services

## 2012-08-25 ENCOUNTER — Ambulatory Visit (INDEPENDENT_AMBULATORY_CARE_PROVIDER_SITE_OTHER): Payer: No Typology Code available for payment source | Admitting: Family Medicine

## 2012-08-25 ENCOUNTER — Encounter: Payer: Self-pay | Admitting: Family Medicine

## 2012-08-25 VITALS — BP 121/80 | HR 66 | Temp 97.9°F | Ht 65.0 in | Wt 201.0 lb

## 2012-08-25 DIAGNOSIS — M19019 Primary osteoarthritis, unspecified shoulder: Secondary | ICD-10-CM

## 2012-08-25 MED ORDER — TIZANIDINE HCL 4 MG PO TABS: 4.0000 mg | ORAL_TABLET | Freq: Three times a day (TID) | ORAL | Status: DC | PRN

## 2012-08-25 NOTE — Assessment & Plan Note (Signed)
Worsening.  Likely having flare of degenerative joint disease in shoulder and neck and low back.  No signs of neural compromise or infection.   Will increase neurontin, start muscle relaxer for short term and encourage range of motion exercises to prevent frozen shoulder.  See back soon

## 2012-08-25 NOTE — Patient Instructions (Addendum)
Increase your gabapentin to 2 tabs three times daily for the next week then to 3 tabs three times daily   Take zanaflex 1/2 tab three times daily as needed.  Can increase to 1 tab if does not make you too sleepy  Keep taking the diclofenac  May take the tramadol every 6 hours   Do the wall walk three times daily use heat before then ice after  Come back in 2 weeks for a recheck

## 2012-08-25 NOTE — Progress Notes (Signed)
  Subjective:    Patient ID: Latoya Swanson, female    DOB: 1951-08-07, 61 y.o.   MRN: [id number]  HPI  Shoulder Neck and Back pain Her shoulder pain is worsened since injection.  Also having more pain in left neck and lower back.  Taking her diclofenac and tramadol.  No incontinence or hand weakness. No fever or rash  PMH - MRI in 2011 showed degenerative joint disease of neck  Review of Systems     Objective:   Physical Exam  Markedly decreased ROM of neck especially with left gaze with pain going down her right arm Pain also with supin pronation of right elbow Distal hand grip equal bilateral Pulses intact       Assessment & Plan:

## 2012-09-08 ENCOUNTER — Ambulatory Visit: Payer: No Typology Code available for payment source | Admitting: Family Medicine

## 2012-09-09 ENCOUNTER — Other Ambulatory Visit: Payer: Self-pay | Admitting: Family Medicine

## 2012-09-20 ENCOUNTER — Ambulatory Visit: Payer: No Typology Code available for payment source | Admitting: Family Medicine

## 2012-10-04 ENCOUNTER — Other Ambulatory Visit: Payer: Self-pay | Admitting: Family Medicine

## 2012-11-14 ENCOUNTER — Other Ambulatory Visit: Payer: Self-pay | Admitting: Family Medicine

## 2012-12-27 ENCOUNTER — Ambulatory Visit (INDEPENDENT_AMBULATORY_CARE_PROVIDER_SITE_OTHER): Payer: No Typology Code available for payment source | Admitting: Family Medicine

## 2012-12-27 ENCOUNTER — Encounter: Payer: Self-pay | Admitting: Family Medicine

## 2012-12-27 VITALS — BP 150/74 | HR 91 | Temp 97.8°F | Ht 65.0 in | Wt 199.0 lb

## 2012-12-27 DIAGNOSIS — Z9181 History of falling: Secondary | ICD-10-CM | POA: Insufficient documentation

## 2012-12-27 DIAGNOSIS — R5383 Other fatigue: Secondary | ICD-10-CM

## 2012-12-27 DIAGNOSIS — R5381 Other malaise: Secondary | ICD-10-CM

## 2012-12-27 LAB — COMPREHENSIVE METABOLIC PANEL
ALT: 128 U/L — ABNORMAL HIGH (ref 0–35)
Albumin: 3.5 g/dL (ref 3.5–5.2)
Alkaline Phosphatase: 155 U/L — ABNORMAL HIGH (ref 39–117)
CO2: 25 mEq/L (ref 19–32)
Glucose, Bld: 110 mg/dL — ABNORMAL HIGH (ref 70–99)
Potassium: 3.7 mEq/L (ref 3.5–5.3)
Sodium: 139 mEq/L (ref 135–145)
Total Bilirubin: 0.9 mg/dL (ref 0.3–1.2)
Total Protein: 6.9 g/dL (ref 6.0–8.3)

## 2012-12-27 LAB — CBC
MCH: 32.6 pg (ref 26.0–34.0)
MCV: 93.5 fL (ref 78.0–100.0)
Platelets: 109 10*3/uL — ABNORMAL LOW (ref 150–400)
RBC: 3.99 MIL/uL (ref 3.87–5.11)
RDW: 14.1 % (ref 11.5–15.5)
WBC: 4.4 10*3/uL (ref 4.0–10.5)

## 2012-12-27 LAB — TSH: TSH: 2.073 u[IU]/mL (ref 0.350–4.500)

## 2012-12-27 NOTE — Assessment & Plan Note (Signed)
With lightheadness and perhaps syncope.  No signs of coronary artery disease or focal neurologic disease.  Perhaps orthostatic hypotension.  Will check labs to rule out anemia or electrolyte disturbance.  Monitor blood pressure at home

## 2012-12-27 NOTE — Patient Instructions (Addendum)
Come back in 2-3 weeks  Bring in all your medications - ALL bottles  If you have another fall - write down what you were doing and what happened and how [place] it lasted  Call 911 if you pass out completely  When you feel like you might fall sit down and check your blood pressure

## 2012-12-27 NOTE — Progress Notes (Signed)
  Subjective:    Patient ID: Latoya Swanson, female    DOB: [birth year], 62 y.o.   MRN: [id number]  HPI  Dizziness/fatigue Has felt unsteady and has fallen several times in the last months.  Thinks may have lost cx once.  Did hit head in the back once.  Prodrome of feeling lightheadness and was standing during each episode.  No chest pain or shortness of breath with exertion.  No focal weakness or visual changes or incontinence or tongue biting.   Was back to normal very soon after sitting or falling.   Takes tramadol, trazadone, maxzide and gabapentin but has not changed dosages  PMH - no history heart disease   Review of Systems     Objective:   Physical Exam  Alert no acute distress Eye - Pupils Equal Round Reactive to light, Extraocular movements intact, Fundi without hemorrhage or visible lesions, Conjunctiva without redness or discharge Neck:  No deformities, thyromegaly, masses, or tenderness noted.   Supple with full range of motion without pain. Mouth - no lesions, mucous membranes are moist, no decaying teeth  Heart - Regular rate and rhythm.  No murmurs, gallops or rubs.    Lungs:  Normal respiratory effort, chest expands symmetrically. Lungs are clear to auscultation, no crackles or wheezes. Abdomen: soft and non-tender without masses, organomegaly or hernias noted.  No guarding or rebound Neurologic exam :  Able to walk around room and get on exam table  Cn 2-7 intact Strength equal & normal in upper & lower extremities Able to walk on heels and toes.   Balance normal  Romberg normal, finger to nose        Assessment & Plan:

## 2013-01-10 ENCOUNTER — Ambulatory Visit: Payer: No Typology Code available for payment source | Admitting: Family Medicine

## 2013-01-15 ENCOUNTER — Other Ambulatory Visit: Payer: Self-pay | Admitting: Family Medicine

## 2013-01-18 NOTE — Telephone Encounter (Signed)
Patient is calling because her pharmacy has not heard back on the refill requests for Tramadol and Gabapentin and she is out and in a lot of pain.

## 2013-01-24 NOTE — Telephone Encounter (Signed)
Spoke with pt and she has received meds. Fleeger, Latoya Swanson

## 2013-01-26 ENCOUNTER — Encounter: Payer: Self-pay | Admitting: Family Medicine

## 2013-01-26 ENCOUNTER — Ambulatory Visit (INDEPENDENT_AMBULATORY_CARE_PROVIDER_SITE_OTHER): Payer: No Typology Code available for payment source | Admitting: Family Medicine

## 2013-01-26 VITALS — BP 142/83 | HR 83 | Ht 65.0 in | Wt 198.0 lb

## 2013-01-26 DIAGNOSIS — R300 Dysuria: Secondary | ICD-10-CM

## 2013-01-26 DIAGNOSIS — R5381 Other malaise: Secondary | ICD-10-CM

## 2013-01-26 LAB — POCT URINALYSIS DIPSTICK
Blood, UA: NEGATIVE
Glucose, UA: NEGATIVE
Ketones, UA: NEGATIVE
Spec Grav, UA: 1.015

## 2013-01-26 NOTE — Assessment & Plan Note (Signed)
With intermittent falls.  Normal lab work up and normal neurological exam and no signs of orthostatic hypotension.  Suspect maybe due to medications will stop trazadone and attempt to wean gabapentin if does not resolve.   If not improved may try to wean blood pressure medications slightly.

## 2013-01-26 NOTE — Patient Instructions (Addendum)
Stop the Trazadone  If after 2 weeks for no Trazadone and you are still falling or feeling lightheadness then  Cut back on the gabapentin to only at night  Come back in 1 month April 16

## 2013-01-26 NOTE — Progress Notes (Signed)
  Subjective:    Patient ID: Latoya Swanson, female    DOB: [birth year], 62 y.o.   MRN: [id number]  HPI  Fatigue and Falling Continues to feel somewhat tired and has fallen once since last visit.  She had been out walking for about 30 minutes and was coming home and fell in the yard.  No injuries or LOC.  No focal vision changes or loss of strength.  Does have mild lightheadness when she stands.  No new neurologic symptoms or chest pain or shortness of breath   Review of Symptoms - see HPI  PMH - Smoking status noted.   Lab work up was unrevealing excpet for her chronic LFT changes due to Hep C   Review of Systems     Objective:   Physical Exam  Alert no acute distress Oriented Neurologic exam : Cn 2-7 intact Strength equal & normal in upper & lower extremities Able to walk on heels and toes.   Balance normal  Romberg normal, finger to nose show mild tremor but other wise normal Heart - Regular rate and rhythm.  No murmurs, gallops or rubs.    Lungs:  Normal respiratory effort, chest expands symmetrically. Lungs are clear to auscultation, no crackles or wheezes.  No orthostatic blood pressure changes        Assessment & Plan:

## 2013-01-31 ENCOUNTER — Telehealth: Payer: Self-pay | Admitting: Family Medicine

## 2013-01-31 NOTE — Telephone Encounter (Signed)
Still falling - fell yesterday and bruised her arm  She agrees to be seen in [name] [facility]  Will get her scheduled

## 2013-01-31 NOTE — Telephone Encounter (Signed)
Scheduled appointment for April

## 2013-02-02 ENCOUNTER — Ambulatory Visit: Payer: No Typology Code available for payment source | Admitting: Home Health Services

## 2013-02-07 ENCOUNTER — Ambulatory Visit: Payer: No Typology Code available for payment source | Admitting: Home Health Services

## 2013-02-23 ENCOUNTER — Ambulatory Visit (INDEPENDENT_AMBULATORY_CARE_PROVIDER_SITE_OTHER): Payer: No Typology Code available for payment source | Admitting: Family Medicine

## 2013-02-23 ENCOUNTER — Encounter: Payer: Self-pay | Admitting: Family Medicine

## 2013-02-23 VITALS — BP 124/81 | HR 68 | Ht 65.0 in | Wt 191.0 lb

## 2013-02-23 DIAGNOSIS — Z9181 History of falling: Secondary | ICD-10-CM

## 2013-02-23 LAB — VITAMIN B12: Vitamin B-12: 1175 pg/mL — ABNORMAL HIGH (ref 211–911)

## 2013-02-23 NOTE — Assessment & Plan Note (Signed)
Continues to fall despite decreasing sedating medications.  Neurologic exam is normal except for mildly abnormal Romberg.  Does not seem to be progressive.  Lab work up unrevealing.  Will check b12 and vit d.  If normal may refer to neurology

## 2013-02-23 NOTE — Patient Instructions (Addendum)
I will be in touch about your lab tests.  If they are ok we will refer you to a neurologist for further evaluation  Keep doing the exercises especially the closed eye standing test   Call if any new symptoms or worsening  Get a mammogram

## 2013-02-23 NOTE — Progress Notes (Signed)
  Subjective:    Patient ID: Latoya Swanson, female    DOB: 02/01/1951, 62 y.o.   MRN: [id number]  HPI  Fatigue and Falling Continues to fall.  Last was Saturday when fell off a stool.  Did not feel dizzy, did not lose cx, no seizure type activity or incontinence.  Was able to get up by herself without assistance No focal vision changes or loss of strength.  Does have mild lightheadness when she stands.  No new neurologic symptoms or chest pain or shortness of breath.   Did stop taking trazadone and has cut back on gabapentin but has not noticed any changes   Brings in list of blood pressures.  All well controlled without significant lows   PMH Never been evauated for falls in past  Review of Systems     Objective:   Physical Exam Alert no acute distress Able to stand and walk and turn and sit without difficulties Neurologic exam : Cn 2-7 intact Strength equal & normal in upper & lower extremities Able to walk on heels and toes.   Romberg - breaks when closes eyes on first attempt. On subsequent attempts sways with eyes closed but does not break. Finger to nose and Heel to shin intact        Assessment & Plan:

## 2013-02-24 ENCOUNTER — Other Ambulatory Visit: Payer: Self-pay | Admitting: Family Medicine

## 2013-02-24 ENCOUNTER — Encounter: Payer: Self-pay | Admitting: Family Medicine

## 2013-02-24 LAB — VITAMIN D 25 HYDROXY (VIT D DEFICIENCY, FRACTURES): Vit D, 25-Hydroxy: <10 ng/mL — ABNORMAL LOW (ref 30–89)

## 2013-02-24 MED ORDER — ERGOCALCIFEROL 1.25 MG (50000 UT) PO CAPS: 50000.0000 [IU] | ORAL_CAPSULE | ORAL | Status: DC

## 2013-02-24 MED ORDER — VITAMIN D-3 25 MCG (1000 UT) PO CAPS: 1.0000 | ORAL_CAPSULE | Freq: Every day | ORAL | Status: AC

## 2013-03-10 ENCOUNTER — Other Ambulatory Visit: Payer: Self-pay | Admitting: Family Medicine

## 2013-04-20 ENCOUNTER — Ambulatory Visit (INDEPENDENT_AMBULATORY_CARE_PROVIDER_SITE_OTHER): Payer: No Typology Code available for payment source | Admitting: Family Medicine

## 2013-04-20 ENCOUNTER — Encounter: Payer: Self-pay | Admitting: Family Medicine

## 2013-04-20 VITALS — BP 143/87 | HR 69 | Ht 65.0 in | Wt 197.0 lb

## 2013-04-20 DIAGNOSIS — Z124 Encounter for screening for malignant neoplasm of cervix: Secondary | ICD-10-CM

## 2013-04-20 DIAGNOSIS — M25511 Pain in right shoulder: Secondary | ICD-10-CM | POA: Insufficient documentation

## 2013-04-20 DIAGNOSIS — M25519 Pain in unspecified shoulder: Secondary | ICD-10-CM

## 2013-04-20 MED ORDER — METHYLPREDNISOLONE ACETATE 40 MG/ML IJ SUSP
40.0000 mg | Freq: Once | INTRAMUSCULAR | Status: AC
  Administered 2013-04-20: 40 mg via INTRA_ARTICULAR

## 2013-04-20 MED ORDER — CYCLOBENZAPRINE HCL 5 MG PO TABS: 5.0000 mg | ORAL_TABLET | Freq: Three times a day (TID) | ORAL | Status: AC | PRN

## 2013-04-20 NOTE — Patient Instructions (Signed)
It was good to see you today! I have sent in a prescription for a muscle relaxer that you can take up to 3 times per day for the next several days for your neck.  Don't take it and drive.

## 2013-04-20 NOTE — Progress Notes (Signed)
Patient ID: Latoya Swanson, female   DOB: 03-May-1951, 62 y.o.   MRN: [id number] Subjective: The patient is a 62 y.o. year old female who presents today for right shoulder and neck pain.  Patient reports that her pain began about 5 days ago after mowing the lawn. There were no falls or traumatic injury. The pain initially started in the right shoulder with motion and assess progress. It is not present in the right shoulder, radiating down into the upper right arm, and up into the right side of the neck. The patient reports significant range of motion limitation secondary to pain on the right along with pain on rotation of her neck. She denies any weakness, numbness, or tingling in her arm. She denies any shooting pains. She remembers having had similar pain in the past, obtain an injection for it, and experiencing some relief. She is requesting an injection today.  Patient's past medical, social, and family history were reviewed and updated as appropriate. History  Substance Use Topics  . Smoking status: Former Smoker    Types: Cigarettes    Quit date: 04/25/2012  . Smokeless tobacco: Never Used     Comment: 2-3 cigs a day  . Alcohol Use: No     Comment: Ocassionally on the weekend.    Objective:  Filed Vitals:   04/20/13 1014  BP: 143/87  Pulse: 69   Gen: No acute distress Neck: There is significant paraspinous muscle spasm and tenderness on the right Extremities: Left shoulder has full range of motion. Right shoulder has a severely limited flexion and extension secondary to pain. Internal rotation does not produce pain. External rotation does produce some pain. Rotator cuff testing does demonstrate pain but no weakness. There is no muscle atrophy. There is no bony abnormality.  Procedure note: Consent obtained and, performed. Right shoulder was injected with 40 cc of Depo-Medrol and solution with 3 cc of 1% lidocaine without epinephrine using a posterior approach. Patient tolerated the  procedure well and noted at least some relief immediately following the procedure.  Assessment/Plan:  Please also see individual problems in problem list for problem-specific plans.

## 2013-04-20 NOTE — Assessment & Plan Note (Signed)
N. the neck is most likely referred from the shoulder. Injection performed today. I provided muscle relaxers for pain relief related to muscle spasm in the neck. I cautioned patient against her use while driving or operating heavy machinery.

## 2013-04-29 ENCOUNTER — Telehealth: Payer: Self-pay | Admitting: Family Medicine

## 2013-04-29 NOTE — Telephone Encounter (Signed)
Pt was in last week. Saw dr Latoya Swanson. Gave an injection for shoulder. Now feels tingling in forefinger and thumb. When moves neck to shoulder on right side, pain is intense. Does she need another appt or just call something in? Please advise

## 2013-04-29 NOTE — Telephone Encounter (Signed)
Will fwd to Dr. Louanne Belton.  Alexander Mcauley, Darlyne Russian, CMA

## 2013-05-02 NOTE — Telephone Encounter (Signed)
We should probably see her.

## 2013-05-02 NOTE — Telephone Encounter (Signed)
Pt notified she needs an appt this week.  Pt verbalized understanding.  Jayquan Bradsher, Darlyne Russian, CMA

## 2013-05-18 ENCOUNTER — Ambulatory Visit (INDEPENDENT_AMBULATORY_CARE_PROVIDER_SITE_OTHER): Payer: No Typology Code available for payment source | Admitting: Family Medicine

## 2013-05-18 ENCOUNTER — Encounter: Payer: Self-pay | Admitting: Family Medicine

## 2013-05-18 VITALS — BP 150/94 | HR 69 | Temp 97.9°F | Wt 193.0 lb

## 2013-05-18 DIAGNOSIS — I10 Essential (primary) hypertension: Secondary | ICD-10-CM

## 2013-05-18 NOTE — Progress Notes (Signed)
  Subjective:    Patient ID: Latoya Swanson, female    DOB: Jun 11, 1951, 62 y.o.   MRN: [id number]  HPI  R shoulder Neck Pain Worsening with pain going from base of neck down to shoulder.  Worse with movement better with rest.  No loss of strength in hands.  No redness or swelling  Does soemtime feel tingling.  Has been trying to do range of motion exercises.  Taking tramadol, gabapentin twice daily and diclofenac.  Had recent injection did not help much.    PMH - degenerative joint disease of neck and spine  HYPERTENSION Disease Monitoring Home BP Monitoring relates readings are usually in  130/70 when not in pain Chest pain- no    Dyspnea- no Medications Compliance-  Both regularly. Lightheadedness-  no  Edema- no ROS - See HPI  PMH Lab Review   Potassium  Date Value Range Status  12/27/2012 3.7  3.5 - 5.3 mEq/L Final     Sodium  Date Value Range Status  12/27/2012 139  135 - 145 mEq/L Final     Creat  Date Value Range Status  12/27/2012 0.54  0.50 - 1.10 mg/dL Final     Creatinine, Ser  Date Value Range Status  05/04/2012 0.75  0.50 - 1.10 mg/dL Final           Review of Symptoms - see HPI  PMH - Smoking status noted.     Review of Systems     Objective:   Physical Exam  Alert mild distress Pain with range of motion of neck and shoulder Limited internal rotation and abduction of shoulder to about 20 degrees from full Distal grip strengh and sensation normal Tender over muscles without deformity      Assessment & Plan:

## 2013-05-18 NOTE — Assessment & Plan Note (Signed)
Not at goal but patient is in significant pain.  Reports controlled at home.  Will have her monitor

## 2013-05-18 NOTE — Patient Instructions (Addendum)
Go up on your gapapentin to three times daily if it does not cause balance problems  Consider Physical Therapy call if you would like to go  Call for a mammogram  Check your blood pressure at home if regularly > 140/90 then call me  Call your hepatitis clinic   Come back in 3-4 mo to check your blood pressure

## 2013-05-19 ENCOUNTER — Other Ambulatory Visit: Payer: Self-pay | Admitting: Family Medicine

## 2013-06-09 ENCOUNTER — Telehealth: Payer: Self-pay | Admitting: Family Medicine

## 2013-06-09 MED ORDER — GABAPENTIN 300 MG PO CAPS: 300.0000 mg | ORAL_CAPSULE | Freq: Three times a day (TID) | ORAL | Status: DC

## 2013-06-09 MED ORDER — ARIPIPRAZOLE 2 MG PO TABS: 2.0000 mg | ORAL_TABLET | Freq: Every day | ORAL | Status: DC

## 2013-06-09 MED ORDER — FLUOXETINE HCL 20 MG PO CAPS: 60.0000 mg | ORAL_CAPSULE | Freq: Every day | ORAL | Status: DC

## 2013-06-09 NOTE — Telephone Encounter (Signed)
Done.  LC

## 2013-06-09 NOTE — Telephone Encounter (Signed)
Spoke with patient and the number of [facility] is [phone].  Also, pt's insurance is no longer accepted at [facility] [employer].  Pt has been getting her Prozac and Abilify prescribed by them.  Pt is in the middle of getting an appt with [facility].  She cannot afford the $75 (out of pocket) office visit to continue getting her meds by [facility] until behavioral health appt made.  Pt was wondering if Dr. Deirdre Priest would refill those meds along with the gabapentin.  Will fwd to MD.  Radene Ou, CMA

## 2013-06-09 NOTE — Telephone Encounter (Signed)
Will fwd to Md.  Yola Paradiso L, CMA  

## 2013-06-09 NOTE — Telephone Encounter (Signed)
Patient calls stating she needs a referral to the Hep C clinic, [facility]. She will need this before they will make her an appt. Also, patient states that [place] increased her dosage of Gabapentin and now she is out meds.

## 2013-06-09 NOTE — Telephone Encounter (Signed)
Pt notified.  Leanah Kolander L, CMA  

## 2013-06-16 ENCOUNTER — Other Ambulatory Visit: Payer: Self-pay | Admitting: Family Medicine

## 2013-07-08 ENCOUNTER — Ambulatory Visit (INDEPENDENT_AMBULATORY_CARE_PROVIDER_SITE_OTHER): Payer: No Typology Code available for payment source | Admitting: Family Medicine

## 2013-07-08 ENCOUNTER — Telehealth: Payer: Self-pay | Admitting: *Deleted

## 2013-07-08 ENCOUNTER — Encounter: Payer: Self-pay | Admitting: Family Medicine

## 2013-07-08 VITALS — BP 135/91 | HR 132 | Temp 98.2°F | Wt 192.0 lb

## 2013-07-08 DIAGNOSIS — L299 Pruritus, unspecified: Secondary | ICD-10-CM

## 2013-07-08 DIAGNOSIS — B1920 Unspecified viral hepatitis C without hepatic coma: Secondary | ICD-10-CM

## 2013-07-08 MED ORDER — HYDROXYZINE HCL 25 MG PO TABS: 25.0000 mg | ORAL_TABLET | Freq: Three times a day (TID) | ORAL | Status: DC | PRN

## 2013-07-08 MED ORDER — DIPHENHYDRAMINE HCL 50 MG/ML IJ SOLN
50.0000 mg | Freq: Once | INTRAMUSCULAR | Status: AC
  Administered 2013-07-08: 50 mg via INTRAMUSCULAR

## 2013-07-08 NOTE — Patient Instructions (Addendum)
Itching Itching is a symptom that can be caused by many things. These include skin problems (including infections) as well as some internal diseases.  If the itching is affecting just one area of the body, it is most likely due to a common skin problem, such as:  Poison oak and poison ivy.  Contact dermatitis (skin irritation from a plant, chemicals, fiberglass, detergents, new cosmetic, new jewelry, or other substance).  Fungus (such as athlete's foot, jock itch, or ringworm).  Head lice  Dandruff  Insect bite  Infection (such as Shingles or other virus infections). If the itching is all over (widespread), the possible causes are many. These include:   Dry skin or eczema  Heat rash  Hives  Liver disorders  Kidney disorders TREATMENT  Localized itching   Lubrication of the skin. Use an ointment or cream or other unperfumed moisturizers if the skin is dry. Apply frequently, especially after bathing.  Anti-itch medicines. These medications may help control the urge to scratch. Scratching always makes itching worse and increases the chance of getting an infection.  Cortisone creams and ointments. These help reduce the inflammation.  Antibiotics. Skin infections can cause itching. Topical or oral antibiotics may be needed for 10 to 20 days to get rid of an infection. If you can identify what caused the itching, avoid this substance in the future.  Widespread itching  The following measures may help to relieve itching regardless of the cause:   Wash the skin once with soap to remove irritants.  Bathe in tepid water with baking soda, cornstarch, or oatmeal.  Use calamine lotion (nonprescription) or a baking soda solution (1 teaspoon in 4 ounces of water on the skin).  Apply 1% hydrocortisone cream (no prescription needed). Do not use this if there might be a skin infection.  Avoid scratching.  Avoid itchy or tight-fitting clothes.  Avoid excessive heat, sweating,  scented soaps, and swimming pools.  The lubricants, anti-itch medicines, etc. noted above may be helpful for controlling symptoms. SEEK MEDICAL CARE IF:   The itching becomes severe.  Your itch is not better after 1 week of treatment. Contact your caregiver to schedule further evaluation. Document Released: 10/27/2005 Document Revised: 01/19/2012 Document Reviewed: 04/16/2007 Exit[employer] Patient Information 2014 [employer].  

## 2013-07-08 NOTE — Telephone Encounter (Signed)
Pt reports that for the past few days eyes and body have been itching - otc Benadryl no relief - no changes known to pt - no bumps, rashes  Or redness. Appointment made for same day Wyatt Haste, RN-BSN

## 2013-07-08 NOTE — Progress Notes (Signed)
Patient ID: Latoya Swanson, female   DOB: August 07, 1951, 62 y.o.   MRN: [id number]  [facility] Lameka Disla M. Mallori Araque, MD Phone: [phone]   Subjective: HPI: Patient is a 62 y.o. female presenting to clinic today for itching all over.  Itching all over her body. She has tried Aveeno, baking soda, benadryl. Nothing helped. Has history of Hep C. Last Cmet was in Feb with transaminases in the 100's. Appt in Sept pending for hepatology. No new lotions, no new detergents, no new medication. Taking tramadol, but no other narcotics. Denies drug use.  History Reviewed: Former smoker.  ROS: Please see HPI above. No rashes  Objective: Office vital signs reviewed. BP 135/91  Pulse 132  Temp(Src) 98.2 F (36.8 C) (Oral)  Wt 192 lb (87.091 kg)  BMI 31.95 kg/m2  LMP 12/01/2001  Physical Examination:  General: Awake, alert. NAD but constantly scratching HEENT: Atraumatic, normocephalic. MMM Pulm: CTAB, no wheezes Cardio: RRR, no murmurs appreciated Abdomen:+BS, soft, nontender, nondistended Extremities: No edema Skin: No rash, other than dry skin on arms. Excoriations on back but no open wounds Neuro: Grossly intact  Assessment: 62 y.o. female with pruritis   Plan: See Problem List and After Visit Summary

## 2013-07-08 NOTE — Assessment & Plan Note (Signed)
Most likely secondary to liver disease rather than contact. Will check Cmet today. Given Benadryl in clinic and Rx for Atarax to take as needed for itching. Advised that it may make her sleepy. F/u with PCP.

## 2013-07-09 LAB — COMPREHENSIVE METABOLIC PANEL
ALT: 160 U/L — ABNORMAL HIGH (ref 0–35)
Albumin: 4.1 g/dL (ref 3.5–5.2)
CO2: 23 mEq/L (ref 19–32)
Calcium: 9.2 mg/dL (ref 8.4–10.5)
Chloride: 107 mEq/L (ref 96–112)
Potassium: 3.6 mEq/L (ref 3.5–5.3)
Sodium: 137 mEq/L (ref 135–145)
Total Protein: 7.3 g/dL (ref 6.0–8.3)

## 2013-07-21 ENCOUNTER — Other Ambulatory Visit: Payer: Self-pay | Admitting: Family Medicine

## 2013-07-22 NOTE — Telephone Encounter (Signed)
Rx called in verbally. Hali Balgobin Dawn  

## 2013-07-22 NOTE — Telephone Encounter (Signed)
Please phon in tramadol rx Thanks [facility]

## 2013-08-28 ENCOUNTER — Other Ambulatory Visit: Payer: Self-pay | Admitting: Family Medicine

## 2013-09-12 ENCOUNTER — Other Ambulatory Visit: Payer: Self-pay | Admitting: Family Medicine

## 2013-09-20 ENCOUNTER — Telehealth: Payer: Self-pay | Admitting: Family Medicine

## 2013-09-20 DIAGNOSIS — B1710 Acute hepatitis C without hepatic coma: Secondary | ICD-10-CM

## 2013-09-20 NOTE — Telephone Encounter (Signed)
Pt requests dr or nurse to call [facility] team [phone] regarding her liver disease. She needs a referral so she can continue her care with dr Timothy Lasso. Please advise  " please put on there this is very important. I am having some symptons. Please call me when the referral is complete."

## 2013-09-21 NOTE — Telephone Encounter (Signed)
I put in re-referral to Hep C clinic Please call in  Thanks [facility]

## 2013-09-21 NOTE — Telephone Encounter (Signed)
Referral form completed and given to [facility]. Latoya Swanson, Maryjo Rochester

## 2013-09-21 NOTE — Telephone Encounter (Signed)
Pts previous MD is no longer there and we have to send a new Referral in order for the new MD to see her.  They will fax a form and then we will send referral back for review. Latoya Swanson, Maryjo Rochester

## 2013-09-22 NOTE — Telephone Encounter (Signed)
Referral faxed, spoke with pt and let pt know [facility] will contact her x appt  [place]

## 2013-11-06 ENCOUNTER — Other Ambulatory Visit: Payer: Self-pay | Admitting: Family Medicine

## 2013-11-07 NOTE — Telephone Encounter (Signed)
Rx called in and pt is aware. Tyffani Foglesong,CMA

## 2013-11-07 NOTE — Telephone Encounter (Signed)
Please call in tramadol thanks [place]

## 2013-11-08 ENCOUNTER — Other Ambulatory Visit: Payer: Self-pay | Admitting: Family Medicine

## 2013-12-12 ENCOUNTER — Ambulatory Visit: Payer: No Typology Code available for payment source | Admitting: Family Medicine

## 2013-12-14 ENCOUNTER — Telehealth: Payer: Self-pay | Admitting: Family Medicine

## 2013-12-14 NOTE — Telephone Encounter (Signed)
Patient calls wanting to speak to nurse/MD regarding info that was suppose to be sent to [facility] pertaining to her liver disease. Please call patient.

## 2013-12-14 NOTE — Telephone Encounter (Signed)
Pt wants to pickup her liver US and MRI of liver and take it to her hepatologist.  She will come by to sign a ROI and pick up notes.  Placed ROI and note with Beryle Lathe @ the front desk. Oretta Berkland, Salome Spotted

## 2013-12-22 ENCOUNTER — Other Ambulatory Visit: Payer: Self-pay | Admitting: Internal Medicine

## 2013-12-22 DIAGNOSIS — C220 Liver cell carcinoma: Secondary | ICD-10-CM

## 2014-01-02 ENCOUNTER — Ambulatory Visit
Admission: RE | Admit: 2014-01-02 | Discharge: 2014-01-02 | Disposition: A | Discharge status: Home / Self Care | Payer: Medicaid Other | Source: Ambulatory Visit | Attending: Internal Medicine | Admitting: Internal Medicine

## 2014-01-02 DIAGNOSIS — C220 Liver cell carcinoma: Secondary | ICD-10-CM

## 2014-01-02 MED ORDER — GADOXETATE DISODIUM 0.25 MMOL/ML IV SOLN
9.0000 mL | Freq: Once | INTRAVENOUS | Status: AC | PRN
  Administered 2014-01-02: 9 mL via INTRAVENOUS

## 2014-01-12 ENCOUNTER — Other Ambulatory Visit: Payer: Self-pay | Admitting: Family Medicine

## 2014-01-19 ENCOUNTER — Other Ambulatory Visit: Payer: Self-pay | Admitting: Family Medicine

## 2014-01-20 NOTE — Telephone Encounter (Signed)
Rx called in and pt informed. Latoya Swanson, Latoya Swanson

## 2014-01-20 NOTE — Telephone Encounter (Signed)
Please call in tramadol Rx Thansk

## 2014-03-16 ENCOUNTER — Other Ambulatory Visit: Payer: Self-pay | Admitting: Family Medicine

## 2014-03-29 ENCOUNTER — Telehealth: Payer: Self-pay | Admitting: Home Health Services

## 2014-03-29 NOTE — Telephone Encounter (Signed)
Pt called to tell us that her ID doctor recommend getting an endoscopy and that she needs a referral.  I let her know that it has been almost a year since her last OV and that she would need to schedule an OV with Dr. Erin Hearing for HTN review and referral.  Pt understood and will call back later this week to schedule the appointment.

## 2014-04-17 ENCOUNTER — Encounter: Payer: Self-pay | Admitting: Family Medicine

## 2014-04-17 ENCOUNTER — Ambulatory Visit (INDEPENDENT_AMBULATORY_CARE_PROVIDER_SITE_OTHER): Payer: Commercial Managed Care - HMO | Admitting: Family Medicine

## 2014-04-17 VITALS — BP 135/82 | HR 71 | Temp 98.3°F | Wt 189.0 lb

## 2014-04-17 DIAGNOSIS — K219 Gastro-esophageal reflux disease without esophagitis: Secondary | ICD-10-CM | POA: Diagnosis not present

## 2014-04-17 DIAGNOSIS — M5137 Other intervertebral disc degeneration, lumbosacral region: Secondary | ICD-10-CM | POA: Diagnosis not present

## 2014-04-17 DIAGNOSIS — B1710 Acute hepatitis C without hepatic coma: Secondary | ICD-10-CM

## 2014-04-17 NOTE — Progress Notes (Signed)
   Subjective:    Patient ID: Latoya Swanson, female    DOB: [birth year], 63 y.o.   MRN: [id number]  HPI Neck Shoulder pain Particularly in the R has had on and off for years.  Pain radiates down to elbow.  No weakness or change in sensation.  Pain waxes and wanes. 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    GERD  Disease Monitoring Typical Symptoms:  burning    Bleeding (hematemesis/melena):  no  Food sticking:  no  Weight Loss:  No Medication Monitoring Compliance:  As needed omeprazole   Recent Pneumonia  no       Chief Complaint noted Review of Symptoms - see HPI PMH - Smoking status noted.    Review of Systems     Objective:   Physical Exam  Alert no acute distress Tender posterior right neck trapezius no central bony tenderness Distal UE strength and sensation intact       Assessment & Plan:

## 2014-04-17 NOTE — Assessment & Plan Note (Signed)
Stable on PPI with chronic as needed nsaid use for DJD

## 2014-04-17 NOTE — Assessment & Plan Note (Signed)
Refer to GI at request of Hep C clinic for upper endoscopy

## 2014-04-17 NOTE — Patient Instructions (Addendum)
Good to see you today!  Thanks for coming in.  See [facility] for upper endoscopy and follow up colonoscopy  Try increasing gabapentin to 2 tabs three times daily if not making you too sleepy can go up to 3 tabs three times daily  Come back in 6 months or as needed

## 2014-04-17 NOTE — Assessment & Plan Note (Signed)
Worsened with symptoms in cervical area.  No weakness or signs of fracture or tumor.  Will increase gabapentin

## 2014-04-21 ENCOUNTER — Other Ambulatory Visit: Payer: Self-pay | Admitting: Family Medicine

## 2014-04-22 ENCOUNTER — Other Ambulatory Visit: Payer: Self-pay | Admitting: Family Medicine

## 2014-05-09 ENCOUNTER — Other Ambulatory Visit: Payer: Self-pay | Admitting: Family Medicine

## 2014-05-25 ENCOUNTER — Encounter: Payer: Self-pay | Admitting: Family Medicine

## 2014-06-18 ENCOUNTER — Other Ambulatory Visit: Payer: Self-pay | Admitting: Family Medicine

## 2014-07-13 ENCOUNTER — Ambulatory Visit (INDEPENDENT_AMBULATORY_CARE_PROVIDER_SITE_OTHER): Payer: Commercial Managed Care - HMO | Admitting: Family Medicine

## 2014-07-13 ENCOUNTER — Encounter: Payer: Self-pay | Admitting: Family Medicine

## 2014-07-13 ENCOUNTER — Other Ambulatory Visit: Payer: Self-pay | Admitting: Family Medicine

## 2014-07-13 VITALS — BP 156/94 | HR 66 | Temp 98.1°F | Wt 186.0 lb

## 2014-07-13 DIAGNOSIS — M5412 Radiculopathy, cervical region: Secondary | ICD-10-CM

## 2014-07-13 DIAGNOSIS — Z23 Encounter for immunization: Secondary | ICD-10-CM

## 2014-07-13 NOTE — Assessment & Plan Note (Signed)
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 She has chronic weakness of her right UE, no weakness on left so I do not think it has progressed to the point of requiring surgery. Patient does not appear to be doing many exercises. Plan: trial of increased gabapentin to 600mg  TID, refill flexeril, asked her to not do both at once

## 2014-07-13 NOTE — Progress Notes (Signed)
Patient ID: Latoya Swanson, female   DOB: 04-20-1951, 63 y.o.   MRN: [id number]   Subjective:    Patient ID: Latoya Swanson, female    DOB: [birth year], 63 y.o.   MRN: [id number]  HPI  CC: neck pain  # Shoulder/neck pain:  Chronic issue but she thinks it has gotten slightly worse  Gabapentin helps on and off, not consistent relief  Having some numbness/tingling of upper and lower extremities intermittently  No new weakness  Muscles feel "tight" ROS: no loss of bowel/bladder, no fevers, no CP, no SOB  Review of Systems   See HPI for ROS. All other systems reviewed and are negative.  Past medical history, surgical, family, and social history reviewed and updated in the EMR. No new updates were made today. Objective:  BP 156/94  Pulse 66  Temp(Src) 98.1 F (36.7 C) (Oral)  Wt 186 lb (84.369 kg)  LMP 12/01/2001 Vitals reviewed  General: NAD CV: RRR, normal s1, s2, no murmurs. 2+ radial pulses bilaterally Resp: CTAB, normal effort Back: tenderness to palpation cervical, thoracic, lumbar spine. Tenderness over majority of trapezius bilaterally. Neuro: alert and oriented. Sensation grossly intact upper extremities bilaterally. Strength 4+/5 on right side for arm flexion, extension, grip. 5/5 on left.  Assessment & Plan:  See Problem List Documentation

## 2014-07-13 NOTE — Patient Instructions (Signed)
Increase the gabapentin to 600mg  per dose (2 tablets). Do this for about 1 week and see if there is any change to your symptoms.   You can also use the flexeril as needed for muscle spasms. Try not to increase the gabapentin and start the flexeril at the same time, you want to be able to see which change helped you the most.

## 2014-07-19 ENCOUNTER — Other Ambulatory Visit: Payer: Self-pay | Admitting: Family Medicine

## 2014-07-19 ENCOUNTER — Telehealth: Payer: Self-pay | Admitting: Family Medicine

## 2014-07-19 MED ORDER — CYCLOBENZAPRINE HCL 10 MG PO TABS: 10.0000 mg | ORAL_TABLET | Freq: Three times a day (TID) | ORAL | Status: DC | PRN

## 2014-07-19 NOTE — Telephone Encounter (Signed)
Pt called because she was seen on 9/3 by Dr.Wight and she said that he would be sending in a prescription of Flexeril. She said she still has not received this. Can we call this in. jw

## 2014-07-19 NOTE — Telephone Encounter (Signed)
LM for patient that rx she requested was sent to pharmacy. Teala Daffron,CMA

## 2014-07-19 NOTE — Telephone Encounter (Signed)
Rx sent. -Dr. Lamar Benes

## 2014-08-08 ENCOUNTER — Other Ambulatory Visit: Payer: Self-pay | Admitting: Family Medicine

## 2014-08-21 ENCOUNTER — Ambulatory Visit (INDEPENDENT_AMBULATORY_CARE_PROVIDER_SITE_OTHER): Payer: Commercial Managed Care - HMO | Admitting: Family Medicine

## 2014-08-21 ENCOUNTER — Encounter: Payer: Self-pay | Admitting: Family Medicine

## 2014-08-21 VITALS — BP 144/86 | HR 79 | Temp 98.3°F | Resp 18 | Wt 192.0 lb

## 2014-08-21 DIAGNOSIS — M5137 Other intervertebral disc degeneration, lumbosacral region: Secondary | ICD-10-CM

## 2014-08-21 DIAGNOSIS — I10 Essential (primary) hypertension: Secondary | ICD-10-CM

## 2014-08-21 DIAGNOSIS — M5412 Radiculopathy, cervical region: Secondary | ICD-10-CM

## 2014-08-21 NOTE — Patient Instructions (Signed)
Good to see you today!  Thanks for coming in.  Contact the Hep C clinic to get restart  You can increase your gabapentin to 2-3 tabs three times daily   Come back in 1-2 months Bring all your medications

## 2014-08-21 NOTE — Assessment & Plan Note (Signed)
Stable at goal

## 2014-08-21 NOTE — Assessment & Plan Note (Signed)
Mild flare.  Does not seem to be significant hip joint pain.  Continue as needed analgesics and going to exercises

## 2014-08-21 NOTE — Assessment & Plan Note (Signed)
Improved from recent flare continue as needed analgesics and exercise

## 2014-08-21 NOTE — Progress Notes (Signed)
   Subjective:    Patient ID: Latoya Swanson, female    DOB: [birth year], 63 y.o.   MRN: [id number]  HPI  Neck pain - mainly R sided.  Better with flexaril but does give her dry mouth.  No weakness or skin lesions  R hip back pain Radiates to mid posterior thigh.  No lower weakness.  No rest pain.  Gabapentin helps some.  Takes this and tramadol and voltaren and flexaril intermitently   HYPERTENSION Disease Monitoring Home BP Monitoring not checking Chest pain- no    Dyspnea- no Medications Compliance-  Daily maxzide. Lightheadedness-  no  Edema- no BP Readings from Last 3 Encounters:  08/21/14 144/86  07/13/14 156/94  04/17/14 135/82    ROS - See HPI  PMH Lab Review   Potassium  Date Value Ref Range Status  07/08/2013 3.6  3.5 - 5.3 mEq/L Final     Sodium  Date Value Ref Range Status  07/08/2013 137  135 - 145 mEq/L Final     Creat  Date Value Ref Range Status  07/08/2013 0.60  0.50 - 1.10 mg/dL Final     Creatinine, Ser  Date Value Ref Range Status  05/04/2012 0.75  0.50 - 1.10 mg/dL Final        Chief Complaint noted Review of Symptoms - see HPI PMH - Smoking status noted.   Vital Signs reviewed    Review of Systems     Objective:   Physical Exam  Alert no acute distress Neck - FROM with mild pain.  Distal UE strength normal R hip - tender with range of motion mainly in posterior buttock area.  Distal strength intact       Assessment & Plan:

## 2014-10-09 ENCOUNTER — Other Ambulatory Visit: Payer: Self-pay | Admitting: Family Medicine

## 2014-10-14 ENCOUNTER — Other Ambulatory Visit: Payer: Self-pay | Admitting: Family Medicine

## 2014-10-21 ENCOUNTER — Other Ambulatory Visit: Payer: Self-pay | Admitting: Family Medicine

## 2014-10-23 ENCOUNTER — Telehealth: Payer: Self-pay | Admitting: Family Medicine

## 2014-10-23 NOTE — Telephone Encounter (Signed)
Rx called.  Unable to inform pt because only received VM. Adrine Hayworth, Salome Spotted

## 2014-10-23 NOTE — Telephone Encounter (Signed)
Please call in tramadol rx Thanks

## 2014-10-29 ENCOUNTER — Emergency Department (HOSPITAL_COMMUNITY)
Admission: EM | Admit: 2014-10-29 | Discharge: 2014-10-29 | Disposition: A | Discharge status: Home / Self Care | Payer: Commercial Managed Care - HMO

## 2014-10-29 NOTE — ED Notes (Signed)
Unable to locate for triage.

## 2014-10-29 NOTE — ED Notes (Signed)
The patient was called several times to be triaged and she did not answer.  We looked for her in the restrooms and outside.

## 2014-12-12 ENCOUNTER — Telehealth: Payer: Self-pay | Admitting: Family Medicine

## 2014-12-12 NOTE — Telephone Encounter (Signed)
Will forward to MD to advise on this. [name],CMA  

## 2014-12-12 NOTE — Telephone Encounter (Signed)
Pt has received letter from [facility] stating she should not be taking her Flexeril any more. She needs another medication for the back spasms Please advise

## 2014-12-13 NOTE — Telephone Encounter (Signed)
Dear [name] Team She needs an appointment. Until then she can continue her meds as rx unless she is having excessive sleepiness or dizziness or other symptom. She should be extremely careful taking this medicine and driving or doing anything that requires her to be WIDE AWAKE and attentive as this drug can make you sleepy. THANKS! [name]

## 2014-12-14 NOTE — Telephone Encounter (Signed)
Pt is aware of this.  She states that she has not had any side effects from medication and that it is really helping.  She has made an appt to follow up with Dr. Erin Hearing 01-10-15 and would like to know if she can get a refill on this medication to last until then. [name],CMA

## 2014-12-14 NOTE — Telephone Encounter (Signed)
[place] one refill flexeril same as he wrote before [facility]! [name]

## 2014-12-15 MED ORDER — CYCLOBENZAPRINE HCL 10 MG PO TABS: 10.0000 mg | ORAL_TABLET | Freq: Three times a day (TID) | ORAL | Status: DC | PRN

## 2014-12-15 NOTE — Telephone Encounter (Signed)
Medication sent to pharmacy ok per Dr. Nori Riis.  Latoya Swanson [facility]

## 2015-01-10 ENCOUNTER — Ambulatory Visit: Payer: Commercial Managed Care - HMO | Admitting: Family Medicine

## 2015-01-31 ENCOUNTER — Ambulatory Visit (INDEPENDENT_AMBULATORY_CARE_PROVIDER_SITE_OTHER): Payer: Commercial Managed Care - HMO | Admitting: Family Medicine

## 2015-01-31 ENCOUNTER — Encounter: Payer: Self-pay | Admitting: Family Medicine

## 2015-01-31 VITALS — BP 153/91 | HR 65 | Temp 98.4°F | Ht 66.0 in | Wt 188.2 lb

## 2015-01-31 DIAGNOSIS — M5412 Radiculopathy, cervical region: Secondary | ICD-10-CM

## 2015-01-31 DIAGNOSIS — K219 Gastro-esophageal reflux disease without esophagitis: Secondary | ICD-10-CM

## 2015-01-31 DIAGNOSIS — I10 Essential (primary) hypertension: Secondary | ICD-10-CM | POA: Diagnosis not present

## 2015-01-31 LAB — BASIC METABOLIC PANEL WITH GFR
BUN: 11 mg/dL (ref 6–23)
CHLORIDE: 104 meq/L (ref 96–112)
CO2: 23 meq/L (ref 19–32)
CREATININE: 0.52 mg/dL (ref 0.50–1.10)
Calcium: 9.3 mg/dL (ref 8.4–10.5)
GFR, Est African American: >89 mL/min
Glucose, Bld: 101 mg/dL — ABNORMAL HIGH (ref 70–99)
POTASSIUM: 3 meq/L — AB (ref 3.5–5.3)
Sodium: 140 mEq/L (ref 135–145)

## 2015-01-31 MED ORDER — BACLOFEN 10 MG PO TABS: 10.0000 mg | ORAL_TABLET | Freq: Two times a day (BID) | ORAL | Status: DC | PRN

## 2015-01-31 NOTE — Patient Instructions (Addendum)
Good to see you today!  Thanks for coming in.  Call your insurance company about the shingles shot  Try the baclofen as a muscle relaxer  I will call you if your tests are not good.  Otherwise I will send you a letter.  If you do not hear from me with in 2 weeks please call our office.     We will refer to [place] for colonscopy and possibly endoscopy.  Ask your Hep C doctor why needed?  If you do not hear from them in 2-3 weeks calll me  Check your blood pressure regularly if > usually 150/90 call me

## 2015-01-31 NOTE — Assessment & Plan Note (Signed)
BP Readings from Last 3 Encounters:  01/31/15 153/91  08/21/14 144/86  07/13/14 156/94    Elevated today but by report at goal at  Home.  Maybe due to recent stressor of husband hospitlized yesterday.  Will monitor.  If continues to be high may need to increase medications

## 2015-01-31 NOTE — Assessment & Plan Note (Addendum)
Stable

## 2015-01-31 NOTE — Assessment & Plan Note (Signed)
Stable.  Will try baclofen in place of flexaril.  No weakness or red flags

## 2015-01-31 NOTE — Progress Notes (Signed)
   Subjective:    Patient ID: Latoya Swanson, female    DOB: June 20, 1951, 64 y.o.   MRN: [id number]  HPI   Cervical pain - stopped her flexaril when told was not good for older patients.  Has intermittent shooting pain around shoulders assocaited with cramps.  No weight loss or weakness or incontinence  HYPERTENSION Disease Monitoring Home BP Monitoring usally runs in 130s/70-80 Chest pain- no    Dyspnea- no Medications Compliance-  daily. Lightheadedness-  no  Edema- no  GERD  Disease Monitoring Typical Symptoms:  Mild heart burn    Bleeding (hematemesis/melena):  no  Food sticking:  no  Weight Loss:  no   Medication Monitoring Compliance:  Omeprazole daily   Recent Pneumonia  no    Have attempted taking only PRN yes symptoms return   ROS - See HPI  PMH Lab Review   POTASSIUM  Date Value Ref Range Status  07/08/2013 3.6 3.5 - 5.3 mEq/L Final   SODIUM  Date Value Ref Range Status  07/08/2013 137 135 - 145 mEq/L Final   CREAT  Date Value Ref Range Status  07/08/2013 0.60 0.50 - 1.10 mg/dL Final   CREATININE, SER  Date Value Ref Range Status  05/04/2012 0.75 0.50 - 1.10 mg/dL Final     Chief Complaint noted Review of Symptoms - see HPI PMH - Smoking status noted.   Vital Signs reviewed         Review of Systems     Objective:   Physical Exam  Alert no acute distress Heart - Regular rate and rhythm.  No murmurs, gallops or rubs.    Lungs:  Normal respiratory effort, chest expands symmetrically. Lungs are clear to auscultation, no crackles or wheezes. Grips - normal strength bilaterally       Assessment & Plan:

## 2015-02-01 ENCOUNTER — Other Ambulatory Visit: Payer: Self-pay | Admitting: Family Medicine

## 2015-02-02 ENCOUNTER — Telehealth: Payer: Self-pay | Admitting: Family Medicine

## 2015-02-02 NOTE — Telephone Encounter (Signed)
Family Medicine Emergency Line Telephone Note  Returned a page to Ms. Ing to discuss a medication refill. She reports that she will be out of prozac 20mg  tablets over the weekend and will be leaving for [place] Sunday. Upon review I see the last refill was by her PCP, Dr. Erin Hearing, Sept 10, 2015. She reports the bottle has no refills and says "refills require authorization." After reviewing our clinic policy not to prescribe medications from the emergency line I recommended she call her pharmacy and request refills through them. She was not aware that we can send her medication to be picked up in [place]. She expressed understanding and will do that.   Shealee Yordy B. Bonner Puna, MD, PGY-2 02/02/2015 2:50 PM

## 2015-02-13 ENCOUNTER — Other Ambulatory Visit: Payer: Self-pay | Admitting: Family Medicine

## 2015-02-21 ENCOUNTER — Encounter: Payer: Self-pay | Admitting: Family Medicine

## 2015-02-21 ENCOUNTER — Ambulatory Visit (INDEPENDENT_AMBULATORY_CARE_PROVIDER_SITE_OTHER): Payer: Commercial Managed Care - HMO | Admitting: Family Medicine

## 2015-02-21 VITALS — BP 139/88 | HR 76 | Temp 98.3°F | Ht 65.0 in | Wt 191.2 lb

## 2015-02-21 DIAGNOSIS — M5412 Radiculopathy, cervical region: Secondary | ICD-10-CM | POA: Diagnosis not present

## 2015-02-21 NOTE — Assessment & Plan Note (Signed)
Worsened.  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  She is already taking most indicated meds but will try to increase gabapentin slight.  Also use a collar.   If does not improve will likely need more imaging and perhaps surgery referral.   No sx of cancer or CNS disease

## 2015-02-21 NOTE — Patient Instructions (Addendum)
Good to see you today!  Thanks for coming in.  Wear a cervical collar when ever possible  Use heat on your neck muscles  You can take up to 3 gabapentin three times a day  If you lose strength in the left arm call me  Trying a chiropracter would be fine

## 2015-02-21 NOTE — Progress Notes (Signed)
   Subjective:    Patient ID: Latoya Swanson, female    DOB: 1951-06-30, 64 y.o.   MRN: [id number]  HPI  Neck Pain an left arm numbness Has been worsening for several weeks.  Achy feeling all around neck and sharp pains when moves certain way seems to shoot down L arm which often feels tingly.  No weakness but does feel like cant feel things as well.  No bowel or bladder trouble.  Taking meds as perscribed.  No stomach upset.  Too many gabapentin make her sleepy   PMH - Luby hx of cervical degenerative disease.  Used to take some kind of neck shots?    Review of Systems     Objective:   Physical Exam Alert nad Pain with rotation of neck Neck muscles anter and posterior are tender and tight. Arms - good rom bilaterally.  Normal strenght with grip, wrist and elbow resistance Normal gait        Assessment & Plan:

## 2015-04-04 ENCOUNTER — Other Ambulatory Visit: Payer: Self-pay | Admitting: Family Medicine

## 2015-04-23 ENCOUNTER — Other Ambulatory Visit: Payer: Self-pay | Admitting: Family Medicine

## 2015-04-30 ENCOUNTER — Other Ambulatory Visit: Payer: Self-pay | Admitting: Family Medicine

## 2015-05-07 ENCOUNTER — Other Ambulatory Visit: Payer: Self-pay | Admitting: Family Medicine

## 2015-05-09 ENCOUNTER — Other Ambulatory Visit: Payer: Self-pay | Admitting: Family Medicine

## 2015-05-10 NOTE — Telephone Encounter (Signed)
Please call in tramadol Thanks [place]

## 2015-05-10 NOTE — Telephone Encounter (Signed)
LM for pharmacy with information for script and lvm for patient that this was done. [name],CMA

## 2015-06-05 ENCOUNTER — Telehealth: Payer: Self-pay | Admitting: Family Medicine

## 2015-06-05 DIAGNOSIS — B182 Chronic viral hepatitis C: Secondary | ICD-10-CM

## 2015-06-05 NOTE — Telephone Encounter (Signed)
I placed the referal Thanks

## 2015-06-05 NOTE — Telephone Encounter (Signed)
Pt called because she needs a referral to see Dr. Linus Salmons. Can we place this referral for her. jw

## 2015-06-05 NOTE — Telephone Encounter (Signed)
Will forward to MD to place referral for infectious disease. [name],CMA

## 2015-06-13 ENCOUNTER — Ambulatory Visit (INDEPENDENT_AMBULATORY_CARE_PROVIDER_SITE_OTHER): Payer: Commercial Managed Care - HMO | Admitting: Family Medicine

## 2015-06-13 ENCOUNTER — Encounter: Payer: Self-pay | Admitting: Family Medicine

## 2015-06-13 VITALS — BP 127/73 | HR 67 | Temp 98.3°F | Ht 65.0 in | Wt 184.0 lb

## 2015-06-13 DIAGNOSIS — R300 Dysuria: Secondary | ICD-10-CM

## 2015-06-13 DIAGNOSIS — F329 Major depressive disorder, single episode, unspecified: Secondary | ICD-10-CM | POA: Diagnosis not present

## 2015-06-13 DIAGNOSIS — F32A Depression, unspecified: Secondary | ICD-10-CM

## 2015-06-13 DIAGNOSIS — I10 Essential (primary) hypertension: Secondary | ICD-10-CM

## 2015-06-13 DIAGNOSIS — B1920 Unspecified viral hepatitis C without hepatic coma: Secondary | ICD-10-CM

## 2015-06-13 DIAGNOSIS — M5412 Radiculopathy, cervical region: Secondary | ICD-10-CM

## 2015-06-13 NOTE — Assessment & Plan Note (Signed)
Stable.  She is feeling better.  Has a therapy dog - Ginger who she feels is very helpful.  May consider weaning high dose prozac if she continues to be well controlled

## 2015-06-13 NOTE — Assessment & Plan Note (Signed)
BP Readings from Last 3 Encounters:  06/13/15 127/73  02/21/15 139/88  01/31/15 153/91   At goal continue current medications

## 2015-06-13 NOTE — Patient Instructions (Signed)
Good to see you today!  Thanks for coming in.  I will order a MRI of your neck   Keep taking all medications as you are  Follow up with Dr Linus Salmons for the Hep C  Call about your colonoscopy  You need a pap smear - come back in a  Month or so

## 2015-06-13 NOTE — Assessment & Plan Note (Addendum)
Slowly worsening.  Likely due to chronic impingement but may be having symptoms of myelopathy.   Will recheck MRI since has been > 5 years.  Continue current pain regimen

## 2015-06-13 NOTE — Progress Notes (Signed)
   Subjective:    Patient ID: Latoya Swanson, female    DOB: 1951/10/25, 64 y.o.   MRN: [id number]  HPI UTI symptoms -resolved on own by taking cranberry juice.  No dysuria frequency or hematuria now  Neck Leg Pain Has frequent pain in bilateral neck and R hip.  Arms feel weak intermittenlty and has decreased sensation in both arms frequently.  No bowel or bladder symptoms.   R leg pain starts in hip and radiates down leg. No leg weakness.   Taking gabapentin regularly  Depression Feels is improved.  Enjoying life and new great grandchild.  Has a therapy dog.  Still taking prozac daily   HYPERTENSION Disease Monitoring Home BP Monitoring Not checking Chest pain- no    Dyspnea- no Medications Compliance-  Daily diuretic combo and norvasc. Lightheadedness-  no  Edema- no ROS - See HPI  PMH Lab Review   POTASSIUM  Date Value Ref Range Status  01/31/2015 3.0* 3.5 - 5.3 mEq/L Final   SODIUM  Date Value Ref Range Status  01/31/2015 140 135 - 145 mEq/L Final   CREAT  Date Value Ref Range Status  01/31/2015 0.52 0.50 - 1.10 mg/dL Final   CREATININE, SER  Date Value Ref Range Status  05/04/2012 0.75 0.50 - 1.10 mg/dL Final           Chief Complaint noted Review of Symptoms - see HPI PMH - Smoking status noted.   Vital Signs reviewed    Review of Systems     Objective:   Physical Exam Alert nad Able to stand on heels and toes and do deep knee bend without weakness Hand grips 4+/5 bilaterally but has neck pain when performs       Assessment & Plan:

## 2015-06-15 ENCOUNTER — Other Ambulatory Visit: Payer: Commercial Managed Care - HMO

## 2015-06-15 DIAGNOSIS — B182 Chronic viral hepatitis C: Secondary | ICD-10-CM | POA: Diagnosis not present

## 2015-06-15 LAB — PROTIME-INR
INR: 1.11 (ref <1.50)
Prothrombin Time: 14.3 seconds (ref 11.6–15.2)

## 2015-06-15 LAB — COMPREHENSIVE METABOLIC PANEL
ALBUMIN: 3.8 g/dL (ref 3.6–5.1)
ALT: 183 U/L — AB (ref 6–29)
AST: 184 U/L — ABNORMAL HIGH (ref 10–35)
Alkaline Phosphatase: 134 U/L — ABNORMAL HIGH (ref 33–130)
BILIRUBIN TOTAL: 1 mg/dL (ref 0.2–1.2)
BUN: 13 mg/dL (ref 7–25)
CO2: 24 mmol/L (ref 20–31)
Calcium: 8.9 mg/dL (ref 8.6–10.4)
Chloride: 103 mmol/L (ref 98–110)
Creat: 0.51 mg/dL (ref 0.50–0.99)
GLUCOSE: 111 mg/dL — AB (ref 65–99)
Potassium: 3.6 mmol/L (ref 3.5–5.3)
Sodium: 141 mmol/L (ref 135–146)
TOTAL PROTEIN: 6.9 g/dL (ref 6.1–8.1)

## 2015-06-15 LAB — HEPATITIS B CORE ANTIBODY, TOTAL: Hep B Core Total Ab: NONREACTIVE

## 2015-06-15 LAB — HEPATITIS B SURFACE ANTIGEN: HEP B S AG: NEGATIVE

## 2015-06-15 LAB — HEPATITIS A ANTIBODY, TOTAL: Hep A Total Ab: REACTIVE — AB

## 2015-06-15 LAB — HEPATITIS B SURFACE ANTIBODY,QUALITATIVE: HEP B S AB: POSITIVE — AB

## 2015-06-15 LAB — IRON: Iron: 252 ug/dL — ABNORMAL HIGH (ref 42–145)

## 2015-06-16 ENCOUNTER — Other Ambulatory Visit: Payer: Self-pay | Admitting: Family Medicine

## 2015-06-16 LAB — CBC WITH DIFFERENTIAL/PLATELET
Basophils Absolute: 0 10*3/uL (ref 0.0–0.1)
Basophils Relative: 0 % (ref 0–1)
Eosinophils Absolute: 0.1 10*3/uL (ref 0.0–0.7)
Eosinophils Relative: 2 % (ref 0–5)
HCT: 39.2 % (ref 36.0–46.0)
Hemoglobin: 13.3 g/dL (ref 12.0–15.0)
Lymphocytes Relative: 39 % (ref 12–46)
Lymphs Abs: 1.9 10*3/uL (ref 0.7–4.0)
MCH: 31.4 pg (ref 26.0–34.0)
MCHC: 33.9 g/dL (ref 30.0–36.0)
MCV: 92.5 fL (ref 78.0–100.0)
MPV: 11.4 fL (ref 8.6–12.4)
Monocytes Absolute: 0.6 10*3/uL (ref 0.1–1.0)
Monocytes Relative: 12 % (ref 3–12)
NEUTROS PCT: 47 % (ref 43–77)
Neutro Abs: 2.3 10*3/uL (ref 1.7–7.7)
PLATELETS: 134 10*3/uL — AB (ref 150–400)
RBC: 4.24 MIL/uL (ref 3.87–5.11)
RDW: 13.2 % (ref 11.5–15.5)
WBC: 4.8 10*3/uL (ref 4.0–10.5)

## 2015-06-16 LAB — HIV ANTIBODY (ROUTINE TESTING W REFLEX): HIV: NONREACTIVE

## 2015-06-18 LAB — ANA: ANA: NEGATIVE

## 2015-06-19 LAB — HEPATITIS C RNA QUANTITATIVE
HCV QUANT LOG: 5.52 {Log} — AB (ref <1.18)
HCV Quantitative: 330598 IU/mL — ABNORMAL HIGH (ref <15)

## 2015-06-20 LAB — HEPATITIS C GENOTYPE

## 2015-06-26 ENCOUNTER — Ambulatory Visit
Admission: RE | Admit: 2015-06-26 | Discharge: 2015-06-26 | Disposition: A | Discharge status: Home / Self Care | Payer: Commercial Managed Care - HMO | Source: Ambulatory Visit | Attending: Family Medicine | Admitting: Family Medicine

## 2015-06-26 DIAGNOSIS — M5022 Other cervical disc displacement, mid-cervical region: Secondary | ICD-10-CM | POA: Diagnosis not present

## 2015-06-26 DIAGNOSIS — M9971 Connective tissue and disc stenosis of intervertebral foramina of cervical region: Secondary | ICD-10-CM | POA: Diagnosis not present

## 2015-06-26 DIAGNOSIS — M5412 Radiculopathy, cervical region: Secondary | ICD-10-CM

## 2015-07-10 ENCOUNTER — Other Ambulatory Visit: Payer: Self-pay | Admitting: Family Medicine

## 2015-07-18 ENCOUNTER — Encounter: Payer: Self-pay | Admitting: Internal Medicine

## 2015-07-18 ENCOUNTER — Ambulatory Visit (INDEPENDENT_AMBULATORY_CARE_PROVIDER_SITE_OTHER): Payer: Commercial Managed Care - HMO | Admitting: Internal Medicine

## 2015-07-18 VITALS — BP 133/88 | HR 72 | Temp 97.9°F | Ht 65.0 in | Wt 184.0 lb

## 2015-07-18 DIAGNOSIS — B182 Chronic viral hepatitis C: Secondary | ICD-10-CM | POA: Diagnosis not present

## 2015-07-18 DIAGNOSIS — Z23 Encounter for immunization: Secondary | ICD-10-CM | POA: Diagnosis not present

## 2015-07-18 DIAGNOSIS — K7460 Unspecified cirrhosis of liver: Secondary | ICD-10-CM

## 2015-07-18 MED ORDER — LEDIPASVIR-SOFOSBUVIR 90-400 MG PO TABS: 1.0000 | ORAL_TABLET | Freq: Every day | ORAL | Status: DC

## 2015-07-18 NOTE — Progress Notes (Signed)
HPI:  +Latoya Swanson is a 64 y.o. female who presents for initial evaluation and management of chronic hepatitis C.  Patient tested positive over 5 years ago and has undergone work up but never started treatment. Hepatitis C risk factors present are: IV drug abuse (details: many years ago). Patient denies multiple sexual partners, renal dialysis, sexual contact with person with liver disease, tattoos. Patient has had other studies performed. Results: hepatitis C RNA by PCR, result: positive. Patient has not had prior treatment for Hepatitis C. Patient does have a past history of liver disease. Patient does have a family history of liver disease.  She has cirrhosis noted in MRI.  Has not seen GI for this and no history of EGD.  Had an aunt with alcoholic cirrhosis and varices bleeding.    Patient does have documented immunity to Hepatitis A. Patient does have documented immunity to Hepatitis B.    Review of Systems:  Constitutional: Negative for fatigue, weight loss.  HENT: Negative for hearing loss, ear pain, neck pain, tinnitus and ear discharge.  Eyes: Negative for icterus, discharge and redness.  Respiratory: Negative fordypsnea, wheezing.  Cardiovascular: Negative for chest pain, palpitations, orthopnea, claudication and leg swelling.  Gastrointestinal: Negative for nausea, vomiting, abdominal distention and abdominal pain.Negative for  Genitourinary: Negative for dysuria, urgency, frequency, hematuria and flank pain.  Musculoskeletal: Negative for arthralgias, arthritis Skin: Negative for itching and rash. Neurological: Negative for dizziness and weakness. Endo/Heme/Allergies: Negative for environmental allergies and polydipsia. Does not bruise/bleed easily.      Past Medical History  Diagnosis Date  . 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  . Normal echocardiogram 08/19/2007  . Normal cardiac stress test 2008     [name]  . Hypertension   .  Anxiety   . Depression   . GERD (gastroesophageal reflux disease)     Prior to Admission medications   Medication Sig Start Date End Date Taking? Authorizing Provider  amLODipine (NORVASC) 10 MG tablet TAKE 1 TABLET BY MOUTH DAILY 04/30/15  Yes [name], MD  aspirin 81 MG tablet Take 81 mg by mouth daily.     Yes Historical Provider, MD  baclofen (LIORESAL) 10 MG tablet TAKE 1 TABLET (10 MG TOTAL) BY MOUTH 2 (TWO) TIMES DAILY AS NEEDED FOR MUSCLE SPASMS. 06/18/15  Yes [name], MD  Cholecalciferol (VITAMIN D-3) 1000 UNITS CAPS Take 1 capsule (1,000 Units total) by mouth daily. 02/24/13  Yes [name], MD  diclofenac (VOLTAREN) 75 MG EC tablet TAKE 1 TABLET (75 MG TOTAL) BY MOUTH 2 (TWO) TIMES DAILY AS NEEDED. 06/18/15  Yes [name], MD  FLUoxetine (PROZAC) 20 MG capsule TAKE 3 CAPSULES BY MOUTH EVERY DAY 04/30/15  Yes [name], MD  gabapentin (NEURONTIN) 300 MG capsule TAKE ONE CAPSULE BY MOUTH 3 TIMES A DAY 05/07/15  Yes [name], MD  omeprazole (PRILOSEC) 20 MG capsule TAKE 1 CAPSULE (20 MG TOTAL) BY MOUTH DAILY AS NEEDED. 07/10/15  Yes [name], MD  traMADol (ULTRAM) 50 MG tablet TAKE 1 TABLET BY MOUTH EVERY 6 HOURS AS NEEDED FOR PAIN 05/10/15  Yes [name], MD  triamterene-hydrochlorothiazide (MAXZIDE-25) 37.5-25 MG per tablet TAKE 1 TABLET BY MOUTH DAILY. 04/30/15  Yes [name], MD  Ledipasvir-Sofosbuvir (HARVONI) 90-400 MG TABS Take 1 tablet by mouth daily. 07/18/15   Thayer Headings, MD    Allergies  Allergen Reactions  .  Aspirin     Causes stomach bleeds hx of ulcers    Social History  Substance Use Topics  . Smoking status: Current Some Day Smoker    Types: Cigarettes  . Smokeless tobacco: Never Used     Comment: 2-3 cigs a day  . Alcohol Use: No    Family History  Problem Relation Age of Onset  . Coronary artery disease Father     died age 30  . Breast cancer Mother     died  age 28  . Colon cancer Mother   no liver cancer   Objective:   Filed Vitals:   07/18/15 1453  BP: 133/88  Pulse: 72  Temp: 97.9 F (36.6 C)   GEN: in no apparent distress and alert HEENT: anicteric Cardiac: Cor RRR and No murmurs Lungs: clear Abdomen: Bowel sounds are normal, liver is not enlarged, spleen is not enlarged Ext: peripheral pulses normal, no pedal edema, no clubbing or cyanosis Skin: negative for - jaundice, spider hemangioma, telangiectasia, palmar erythema, ecchymosis and atrophy Musculoskeletal: no joint swelling  Laboratory Genotype:  Lab Results  Component Value Date   HCVGENOTYPE 1a 06/15/2015   HCV viral load:  Lab Results  Component Value Date   HCVQUANT 950932* 06/15/2015   Lab Results  Component Value Date   WBC 4.8 06/15/2015   HGB 13.3 06/15/2015   HCT 39.2 06/15/2015   MCV 92.5 06/15/2015   PLT 134* 06/15/2015    Lab Results  Component Value Date   CREATININE 0.51 06/15/2015   BUN 13 06/15/2015   NA 141 06/15/2015   K 3.6 06/15/2015   CL 103 06/15/2015   CO2 24 06/15/2015    Lab Results  Component Value Date   ALT 183* 06/15/2015   AST 184* 06/15/2015   ALKPHOS 134* 06/15/2015     CHILD-PUGH A  5-6 points: Child class A 7-9 points: Child class B 10-15 points: Child class C  Lab Results  Component Value Date   INR 1.11 06/15/2015   BILITOT 1.0 06/15/2015   ALBUMIN 3.8 06/15/2015    Encephalopathy None (1 point) Grade 1: Altered mood/confusion (2 points) Grade 2: Inappropriate behavior, impending stupor, somnolence (2 points) Grade 3: Markedly confused, stuporous but arousable (3 points) Grade 4: Comatose/unresponsive (3 points) Ascites Absent (1 point) Slight (2 points) Moderate (3 points) Bilirubin  < 2 mg/dL (1 point) 2-3 mg/dL (2 points) > 3 mg/dL (3 points) Albumin > 3.5 g/dL (1 point) 2.8-3.5 g/dL (2 points) < 2.8 g/dL (3 points) Prothrombin time prolongation Less than 4 seconds above control/INR <  1.7 (1 point) 4-6 seconds above control/INR 1.7-2.3 (2 points) More than 6 seconds above control/INR > 2.3 (3 points)   Assessment: Chronic Hepatitis C genotype 1a I discussed with the patient the natural history and progression of chronic hepatitis C infection including about 30% of people who develop cirrhosis of the liver and once cirrhosis is established there is a 2-7% risk per year of liver cancer and liver failure.    Plan: 1) Patient counseled extensively on limiting acetaminophen to no more than 2 grams daily, avoidance of alcohol. 2) Transmission discussed with patient including sexual transmission, sharing razors and toothbrush.   3) Will need referral to gastroenterology if concern for cirrhosis 4) Will need referral for substance abuse counseling: No. 5) Will prescribe Harvoni for 12 weeks once work up complete 6) Hepatitis A vaccine No. 7) Hepatitis B vaccine No. 8) Pneumovax vaccine last done in 2009 9) will follow  up after starting medication

## 2015-07-18 NOTE — Patient Instructions (Signed)
Date 07/18/2015  Dear Latoya Swanson, As discussed in the [facility], your hepatitis C therapy will include the following medications:          Harvoni 90mg /400mg  tablet:           Take 1 tablet by mouth once daily   Please note that ALL MEDICATIONS WILL START ON THE SAME DATE for a total of 12 weeks. ---------------------------------------------------------------- Your HCV Treatment Start Date: TBA   Your HCV genotype:  1a    Liver Fibrosis: cirrhosis    ---------------------------------------------------------------- YOUR PHARMACY CONTACT:   [facility] Phone: [phone] Hours: Monday to Friday 7:30 am to 6:00 pm   Please always contact your pharmacy at least 3-4 business days before you run out of medications to ensure your next month's medication is ready or 1 week prior to running out if you receive it by mail.  Remember, each prescription is for 28 days. ---------------------------------------------------------------- GENERAL NOTES REGARDING YOUR HEPATITIS C MEDICATION:  SOFOSBUVIR/LEDIPASVIR (HARVONI): - Harvoni tablet is taken daily with OR without food. - The tablets are orange. - The tablets should be stored at room temperature.  - Acid reducing agents such as H2 blockers (ie. Pepcid (famotidine), Zantac (ranitidine), Tagamet (cimetidine), Axid (nizatidine) and proton pump inhibitors (ie. Prilosec (omeprazole), Protonix (pantoprazole), Nexium (esomeprazole), or Aciphex (rabeprazole)) can decrease effectiveness of Harvoni. Do not take until you have discussed with a health care provider.    -Antacids that contain magnesium and/or aluminum hydroxide (ie. Milk of Magensia, Rolaids, Gaviscon, Maalox, Mylanta, an dArthritis Pain Formula)can reduce absorption of Harvoni, so take them at least 4 hours before or after Harvoni.  -Calcium carbonate (calcium supplements or antacids such as Tums,  Caltrate, Os-Cal)needs to be taken at least 4 hours hours before or after Harvoni.  -St. John's wort or any products that contain St. John's wort like some herbal supplements  Please inform the office prior to starting any of these medications.  - The common side effects associated with Harvoni include:      1. Fatigue      2. Headache      3. Nausea      4. Diarrhea      5. Insomnia   Support Path is a suite of resources designed to help patients start with HARVONI and move toward treatment completion [place] helps patients access therapy and get off to an efficient start  Benefits investigation and prior authorization support Co-pay and other financial assistance A specialty pharmacy finder CO-PAY COUPON The [place] co-pay coupon may help eligible patients lower their out-of-pocket costs. With a co-pay coupon, most eligible patients may pay no more than $5 per co-pay (restrictions apply) www.harvoni.com call ([phone] [phone] Not valid for patients enrolled in government healthcare prescription drug programs, such as Medicare Part D and Medicaid. Patients in the coverage gap known as the "donut hole" also are not eligible The HARVONI co-pay coupon program will cover the out-of-pocket costs for HARVONI prescriptions up to a maximum of 25% of the catalog price of a 12-week regimen of HARVONI  Please note that this only lists the most common side effects and is NOT a comprehensive list of the potential side effects of these medications. For more information, please review the drug information sheets that come with your medication package from the pharmacy.  ---------------------------------------------------------------- GENERAL HELPFUL HINTS ON HCV THERAPY: 1. No alcohol. 2. Protect against sun-sensitivity/sunburns (wear sunglasses, hat, L[place]rone  sleeves, pants and sunscreen). 3. Stay well-hydrated/well-moisturized. 4. Notify the [facility] of any changes in your other  over-the-counter/herbal or prescription medications. 5. If you miss a dose of your medication, take the missed dose as soon as you remember. Return to your regular time/dose schedule the next day.  6.  Do not stop taking your medications without first talking with your healthcare provider. 7.  You may take Tylenol (acetaminophen), as Maiorino as the dose is less than 2000 mg (OR no more than 4 tablets of the Tylenol Extra Strengths 500mg  tablet) in 24 hours. 8.  You will need to obtain routine labs and/or office visits at [facility] at weeks 4 and 12 as well as 12 and 24 weeks after completion of treatment.   Scharlene Gloss, [place] [facility] [place], [place]  [id number] [phone]

## 2015-07-18 NOTE — Assessment & Plan Note (Addendum)
Will refer to GI for EGD.  Will then do [place] screening every 6 months.

## 2015-07-24 ENCOUNTER — Encounter (HOSPITAL_COMMUNITY): Payer: Self-pay | Admitting: Pharmacy Technician

## 2015-08-02 ENCOUNTER — Ambulatory Visit: Payer: Commercial Managed Care - HMO | Admitting: Pharmacist Clinician (PhC)/ Clinical Pharmacy Specialist

## 2015-08-02 DIAGNOSIS — B182 Chronic viral hepatitis C: Secondary | ICD-10-CM

## 2015-08-02 NOTE — Progress Notes (Signed)
Patient ID: [name], female   DOB: 31-May-1951, 64 y.o.   MRN: [id number] HPI: Latoya Swanson is a 64 y.o. female who is here for her 2 wks visit.  Lab Results  Component Value Date   HCVGENOTYPE 1a 06/15/2015    Allergies: Allergies  Allergen Reactions  . Aspirin     Causes stomach bleeds hx of ulcers    Vitals:    Past Medical History: Past Medical History  Diagnosis Date  . 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  . Normal echocardiogram 08/19/2007  . Normal cardiac stress test 2008     [name]  . Hypertension   . Anxiety   . Depression   . GERD (gastroesophageal reflux disease)     Social History: Social History   Social History  . Marital Status: Single    Spouse Name: Claudie Fisherman   . Number of Children: N/A  . Years of Education: 15   Occupational History  . DISABLED    Social History Main Topics  . Smoking status: Current Some Day Smoker    Types: Cigarettes  . Smokeless tobacco: Never Used     Comment: 2-3 cigs a day  . Alcohol Use: No  . Drug Use: No  . Sexual Activity: Yes    Birth Control/ Protection: Post-menopausal   Other Topics Concern  . Not on file   Social History Narrative   Lives with her husband and her Chow dog.     Labs: HEP B S AB (no units)  Date Value  06/15/2015 POS*   HEPATITIS B SURFACE AG (no units)  Date Value  06/15/2015 NEGATIVE   HCV AB (no units)  Date Value  04/18/2005 pos    Lab Results  Component Value Date   HCVGENOTYPE 1a 06/15/2015    Hepatitis C RNA quantitative Latest Ref Rng 06/15/2015 10/06/2005  HCV Quantitative <15 IU/mL 330598(H) 180000  HCV Quantitative Log <1.18 log 10 5.52(H) -    AST (U/L)  Date Value  06/15/2015 184*  07/08/2013 135*  12/27/2012 168*   ALT (U/L)  Date Value  06/15/2015 183*  07/08/2013 160*  12/27/2012 128*   INR (no units)  Date Value  06/15/2015 1.11  01/03/2009 1.1    CrCl: CrCl cannot be calculated (Unknown  ideal weight.).  Fibrosis Score: F4 as assessed by bx/MRI  Child-Pugh Score: Class A  Previous Treatment Regimen: Naive  Assessment: She recently started on Harvoni for her treatment. She is on her second week of treatment. She is very happy with it so far. She definitely has cirrhosis. Has not missed doses and has tolerated very well. She did ask a lot of good questions about her hep C. Prilosec was on her med profile but she knows to not take it so she has been off. She is going to come back in 2 wks for labs and f/u with Dr. Linus Salmons at a later time.   Recommendations:  Cont Harvoni 1 PO qday VL in 2 wks F/u with Dr Linus Salmons at a later visit  Onnie Boer [place], [place].D., BCPS, AAHIVP Clinical [facility] [place] [facility] 08/02/2015, 1:52 PM

## 2015-08-16 ENCOUNTER — Other Ambulatory Visit: Payer: Commercial Managed Care - HMO

## 2015-08-16 DIAGNOSIS — B182 Chronic viral hepatitis C: Secondary | ICD-10-CM | POA: Diagnosis not present

## 2015-08-17 LAB — HEPATITIS C RNA QUANTITATIVE: HCV Quantitative: NOT DETECTED IU/mL (ref <15)

## 2015-08-29 ENCOUNTER — Ambulatory Visit: Payer: Commercial Managed Care - HMO | Admitting: Family Medicine

## 2015-09-05 ENCOUNTER — Telehealth: Payer: Self-pay | Admitting: Family Medicine

## 2015-09-05 ENCOUNTER — Ambulatory Visit (INDEPENDENT_AMBULATORY_CARE_PROVIDER_SITE_OTHER): Payer: Commercial Managed Care - HMO | Admitting: Family Medicine

## 2015-09-05 ENCOUNTER — Encounter: Payer: Self-pay | Admitting: Family Medicine

## 2015-09-05 ENCOUNTER — Other Ambulatory Visit (HOSPITAL_COMMUNITY)
Admission: RE | Admit: 2015-09-05 | Discharge: 2015-09-05 | Disposition: A | Discharge status: Home / Self Care | Payer: Commercial Managed Care - HMO | Source: Ambulatory Visit | Attending: Family Medicine | Admitting: Family Medicine

## 2015-09-05 VITALS — BP 142/92 | HR 67 | Temp 98.2°F | Ht 65.0 in | Wt 184.0 lb

## 2015-09-05 DIAGNOSIS — M545 Low back pain: Secondary | ICD-10-CM | POA: Diagnosis not present

## 2015-09-05 DIAGNOSIS — N898 Other specified noninflammatory disorders of vagina: Secondary | ICD-10-CM

## 2015-09-05 DIAGNOSIS — Z124 Encounter for screening for malignant neoplasm of cervix: Secondary | ICD-10-CM

## 2015-09-05 DIAGNOSIS — L600 Ingrowing nail: Secondary | ICD-10-CM | POA: Insufficient documentation

## 2015-09-05 DIAGNOSIS — Z01419 Encounter for gynecological examination (general) (routine) without abnormal findings: Secondary | ICD-10-CM | POA: Diagnosis not present

## 2015-09-05 DIAGNOSIS — L280 Lichen simplex chronicus: Secondary | ICD-10-CM

## 2015-09-05 DIAGNOSIS — Z1151 Encounter for screening for human papillomavirus (HPV): Secondary | ICD-10-CM | POA: Insufficient documentation

## 2015-09-05 DIAGNOSIS — Z113 Encounter for screening for infections with a predominantly sexual mode of transmission: Secondary | ICD-10-CM | POA: Diagnosis not present

## 2015-09-05 DIAGNOSIS — M51379 Other intervertebral disc degeneration, lumbosacral region without mention of lumbar back pain or lower extremity pain: Secondary | ICD-10-CM

## 2015-09-05 DIAGNOSIS — M5137 Other intervertebral disc degeneration, lumbosacral region: Secondary | ICD-10-CM

## 2015-09-05 LAB — POCT URINALYSIS DIPSTICK
Bilirubin, UA: NEGATIVE
Blood, UA: NEGATIVE
GLUCOSE UA: NEGATIVE
Ketones, UA: NEGATIVE
Leukocytes, UA: NEGATIVE
Nitrite, UA: NEGATIVE
Protein, UA: NEGATIVE
Spec Grav, UA: 1.01
UROBILINOGEN UA: 0.2
pH, UA: 7

## 2015-09-05 LAB — POCT WET PREP (WET MOUNT): Clue Cells Wet Prep Whiff POC: NEGATIVE

## 2015-09-05 MED ORDER — CLOTRIMAZOLE 1 % EX CREA: 1.0000 "application " | TOPICAL_CREAM | Freq: Two times a day (BID) | CUTANEOUS | Status: DC

## 2015-09-05 MED ORDER — HYDROCORTISONE 2.5 % EX CREA: TOPICAL_CREAM | Freq: Two times a day (BID) | CUTANEOUS | Status: DC

## 2015-09-05 NOTE — Assessment & Plan Note (Signed)
Worsened.  No evidence of significant impingement.  Continue analgesics as prescribed and as needed

## 2015-09-05 NOTE — Patient Instructions (Addendum)
Good to see you today!  Thanks for coming in.  Your neck arthritis is stable - keep moving and your weight controlled and use the pain medications as needed  I referred you to a podiatrist  Monitor your blood pressure - it should be below 140/90  Use the two creams alternating every 6 hours for 2 weeks then twice daily alternating  I will call you with the results of the lab tests  Come back in 6 weeks for a recheck

## 2015-09-05 NOTE — Assessment & Plan Note (Addendum)
Of Vulva.  Appears related to chronic scratching and irritation.  Wil treat with alternating creams and follow up to make sure clears

## 2015-09-05 NOTE — Progress Notes (Signed)
   Subjective:    Patient ID: Latoya Swanson, female    DOB: 02/09/1951, 64 y.o.   MRN: [id number]  HPI  Dysuria and Frequency For about a month. Has most days  No fever or back pain.  Occsl notices blood when wipes  Vaginal Itching Most exterior around lips and anus but also some occsl discharge.  Is sexually active but partner has no symptoms.  No vaginal bleeding or rectal bleeding.  Tried otc monistat which helped a little  Back pain - intermittent sometimes worse than others.  No focal weakness some radiation down to R hip.  Taking analgesics as ordered.  No incontinence  Chief Complaint noted Review of Symptoms - see HPI PMH - Smoking status noted.   Vital Signs reviewed    Review of Systems     Objective:   Physical Exam  Alert nad No CVAT  Genitalia:  Normal introitus for age, diffuse discoloration with thickend skin around vagina and anus.  No skin breakdown or focal hypertrophy, scant white vaginal discharge, mucosa pink and moist, no vaginal or cervical lesions, no vaginal atrophy, no friaility or hemorrhage,  UA and wet prep noted       Assessment & Plan:   Dysuria - no signs of UTI.  May be related to vaginal irriation

## 2015-09-05 NOTE — Telephone Encounter (Signed)
Spoke with Latoya Swanson. Told I would call back

## 2015-09-06 LAB — CERVICOVAGINAL ANCILLARY ONLY
Chlamydia: NEGATIVE
Neisseria Gonorrhea: NEGATIVE

## 2015-09-07 NOTE — Telephone Encounter (Signed)
Not home asked for her to call back

## 2015-09-10 LAB — CYTOLOGY - PAP

## 2015-09-14 ENCOUNTER — Telehealth: Payer: Self-pay | Admitting: Family Medicine

## 2015-09-14 NOTE — Telephone Encounter (Signed)
Patient sciatica is really bothering her today to the point she is unable to put weight on her leg. Would like to know if there is anything that could be called in for patient's pain? Please advise.Call patient at [phone]

## 2015-09-14 NOTE — Telephone Encounter (Signed)
Will forward to MD. Dustine Bertini,CMA  

## 2015-09-17 ENCOUNTER — Telehealth: Payer: Self-pay | Admitting: Family Medicine

## 2015-09-17 NOTE — Telephone Encounter (Signed)
Pt called and would like to speak to the doctor about her sciatica. She can not take the pain. jw

## 2015-09-17 NOTE — Telephone Encounter (Signed)
Patient states when she was examined in clinic it was for her ingrown toenail.  States that it has become swollen now and it "turned purple".  States that she is limping due to her back pain and toe pain.  Also that her pain medication isn't helping at all.  She states that she hasn't heard from podiatry regarding an appt.  Looked into her referral and patient had been called but VM was full.  So I gave patient the number to [facility] and she plans to call in the morning to inquire about an appt.  Will forward to MD to see what patient needs to do about her pain.  She can be reached at [phone]. [name],CMA

## 2015-09-18 NOTE — Telephone Encounter (Signed)
Pain radiating to back of leg Leg feels weak No incontinence I will see hier in AM

## 2015-09-19 ENCOUNTER — Emergency Department (HOSPITAL_COMMUNITY): Payer: Commercial Managed Care - HMO

## 2015-09-19 ENCOUNTER — Encounter: Payer: Self-pay | Admitting: Family Medicine

## 2015-09-19 ENCOUNTER — Observation Stay (HOSPITAL_COMMUNITY)
Admission: EM | Admit: 2015-09-19 | Discharge: 2015-09-21 | Disposition: A | Discharge status: Home Health | Payer: Commercial Managed Care - HMO | Attending: Family Medicine | Admitting: Family Medicine

## 2015-09-19 ENCOUNTER — Ambulatory Visit (INDEPENDENT_AMBULATORY_CARE_PROVIDER_SITE_OTHER): Payer: Commercial Managed Care - HMO | Admitting: Family Medicine

## 2015-09-19 ENCOUNTER — Encounter (HOSPITAL_COMMUNITY): Payer: Self-pay | Admitting: Neurology

## 2015-09-19 VITALS — BP 132/64 | HR 66 | Temp 98.5°F

## 2015-09-19 DIAGNOSIS — F17210 Nicotine dependence, cigarettes, uncomplicated: Secondary | ICD-10-CM | POA: Insufficient documentation

## 2015-09-19 DIAGNOSIS — M4802 Spinal stenosis, cervical region: Secondary | ICD-10-CM | POA: Insufficient documentation

## 2015-09-19 DIAGNOSIS — M25551 Pain in right hip: Secondary | ICD-10-CM | POA: Diagnosis not present

## 2015-09-19 DIAGNOSIS — F329 Major depressive disorder, single episode, unspecified: Secondary | ICD-10-CM | POA: Insufficient documentation

## 2015-09-19 DIAGNOSIS — I10 Essential (primary) hypertension: Secondary | ICD-10-CM | POA: Insufficient documentation

## 2015-09-19 DIAGNOSIS — R300 Dysuria: Secondary | ICD-10-CM | POA: Insufficient documentation

## 2015-09-19 DIAGNOSIS — M5136 Other intervertebral disc degeneration, lumbar region: Secondary | ICD-10-CM | POA: Insufficient documentation

## 2015-09-19 DIAGNOSIS — M5416 Radiculopathy, lumbar region: Secondary | ICD-10-CM | POA: Diagnosis not present

## 2015-09-19 DIAGNOSIS — Z87891 Personal history of nicotine dependence: Secondary | ICD-10-CM | POA: Diagnosis not present

## 2015-09-19 DIAGNOSIS — M549 Dorsalgia, unspecified: Secondary | ICD-10-CM | POA: Diagnosis not present

## 2015-09-19 DIAGNOSIS — M5412 Radiculopathy, cervical region: Secondary | ICD-10-CM | POA: Diagnosis not present

## 2015-09-19 DIAGNOSIS — Z7982 Long term (current) use of aspirin: Secondary | ICD-10-CM | POA: Insufficient documentation

## 2015-09-19 DIAGNOSIS — K219 Gastro-esophageal reflux disease without esophagitis: Secondary | ICD-10-CM | POA: Insufficient documentation

## 2015-09-19 DIAGNOSIS — M79604 Pain in right leg: Secondary | ICD-10-CM | POA: Diagnosis not present

## 2015-09-19 DIAGNOSIS — M5010 Cervical disc disorder with radiculopathy, unspecified cervical region: Secondary | ICD-10-CM | POA: Insufficient documentation

## 2015-09-19 DIAGNOSIS — M5134 Other intervertebral disc degeneration, thoracic region: Secondary | ICD-10-CM | POA: Insufficient documentation

## 2015-09-19 DIAGNOSIS — R531 Weakness: Secondary | ICD-10-CM | POA: Diagnosis not present

## 2015-09-19 DIAGNOSIS — B182 Chronic viral hepatitis C: Secondary | ICD-10-CM | POA: Diagnosis not present

## 2015-09-19 DIAGNOSIS — R52 Pain, unspecified: Secondary | ICD-10-CM | POA: Diagnosis present

## 2015-09-19 DIAGNOSIS — R3915 Urgency of urination: Secondary | ICD-10-CM | POA: Insufficient documentation

## 2015-09-19 DIAGNOSIS — Z66 Do not resuscitate: Secondary | ICD-10-CM | POA: Diagnosis not present

## 2015-09-19 DIAGNOSIS — M5127 Other intervertebral disc displacement, lumbosacral region: Secondary | ICD-10-CM | POA: Diagnosis not present

## 2015-09-19 DIAGNOSIS — M5126 Other intervertebral disc displacement, lumbar region: Secondary | ICD-10-CM | POA: Diagnosis not present

## 2015-09-19 DIAGNOSIS — M545 Low back pain: Secondary | ICD-10-CM | POA: Diagnosis not present

## 2015-09-19 DIAGNOSIS — M546 Pain in thoracic spine: Secondary | ICD-10-CM

## 2015-09-19 DIAGNOSIS — R5383 Other fatigue: Secondary | ICD-10-CM | POA: Insufficient documentation

## 2015-09-19 LAB — BASIC METABOLIC PANEL
Anion gap: 9 (ref 5–15)
BUN: 10 mg/dL (ref 6–20)
CALCIUM: 9.4 mg/dL (ref 8.9–10.3)
CO2: 25 mmol/L (ref 22–32)
CREATININE: 0.66 mg/dL (ref 0.44–1.00)
Chloride: 104 mmol/L (ref 101–111)
GFR calc Af Amer: >60 mL/min (ref >60)
GFR calc non Af Amer: >60 mL/min (ref >60)
GLUCOSE: 105 mg/dL — AB (ref 65–99)
Potassium: 3.5 mmol/L (ref 3.5–5.1)
Sodium: 138 mmol/L (ref 135–145)

## 2015-09-19 LAB — CBC
HEMATOCRIT: 36.9 % (ref 36.0–46.0)
Hemoglobin: 12.3 g/dL (ref 12.0–15.0)
MCH: 31.4 pg (ref 26.0–34.0)
MCHC: 33.3 g/dL (ref 30.0–36.0)
MCV: 94.1 fL (ref 78.0–100.0)
Platelets: 129 10*3/uL — ABNORMAL LOW (ref 150–400)
RBC: 3.92 MIL/uL (ref 3.87–5.11)
RDW: 12.5 % (ref 11.5–15.5)
WBC: 7.6 10*3/uL (ref 4.0–10.5)

## 2015-09-19 MED ORDER — TRIAMTERENE-HCTZ 37.5-25 MG PO TABS
1.0000 | ORAL_TABLET | Freq: Every day | ORAL | Status: DC
  Administered 2015-09-20 – 2015-09-21 (×2): 1 via ORAL
  Filled 2015-09-19 (×2): qty 1

## 2015-09-19 MED ORDER — AMLODIPINE BESYLATE 10 MG PO TABS
10.0000 mg | ORAL_TABLET | Freq: Every day | ORAL | Status: DC
  Administered 2015-09-20 – 2015-09-21 (×2): 10 mg via ORAL
  Filled 2015-09-19 (×2): qty 1

## 2015-09-19 MED ORDER — HYDROMORPHONE HCL 1 MG/ML IJ SOLN
1.0000 mg | Freq: Once | INTRAMUSCULAR | Status: AC
Start: 2015-09-19 — End: 2015-09-19
  Administered 2015-09-19: 1 mg via INTRAMUSCULAR
  Filled 2015-09-19: qty 1

## 2015-09-19 MED ORDER — HYDROMORPHONE HCL 1 MG/ML IJ SOLN
0.5000 mg | INTRAMUSCULAR | Status: DC | PRN
  Administered 2015-09-20 – 2015-09-21 (×5): 0.5 mg via INTRAVENOUS
  Filled 2015-09-19 (×5): qty 1

## 2015-09-19 MED ORDER — PANTOPRAZOLE SODIUM 20 MG PO TBEC
20.0000 mg | DELAYED_RELEASE_TABLET | Freq: Every day | ORAL | Status: DC
  Administered 2015-09-20 – 2015-09-21 (×2): 20 mg via ORAL
  Filled 2015-09-19 (×2): qty 1

## 2015-09-19 MED ORDER — HYDROMORPHONE HCL 1 MG/ML IJ SOLN
0.5000 mg | Freq: Once | INTRAMUSCULAR | Status: AC
  Administered 2015-09-19: 0.5 mg via INTRAVENOUS
  Filled 2015-09-19: qty 1

## 2015-09-19 MED ORDER — TRAMADOL HCL 50 MG PO TABS: 50.0000 mg | ORAL_TABLET | Freq: Four times a day (QID) | ORAL | Status: DC | PRN

## 2015-09-19 MED ORDER — HYDROMORPHONE HCL 1 MG/ML IJ SOLN
2.0000 mg | Freq: Once | INTRAMUSCULAR | Status: AC
  Administered 2015-09-19: 2 mg via INTRAMUSCULAR
  Filled 2015-09-19: qty 2

## 2015-09-19 MED ORDER — HYDROMORPHONE HCL 1 MG/ML IJ SOLN
1.0000 mg | Freq: Once | INTRAMUSCULAR | Status: DC
  Filled 2015-09-19: qty 1

## 2015-09-19 MED ORDER — OXYCODONE HCL 5 MG PO TABS
5.0000 mg | ORAL_TABLET | ORAL | Status: DC | PRN
  Administered 2015-09-19 – 2015-09-21 (×7): 5 mg via ORAL
  Filled 2015-09-19 (×8): qty 1

## 2015-09-19 MED ORDER — BACLOFEN 10 MG PO TABS
10.0000 mg | ORAL_TABLET | Freq: Two times a day (BID) | ORAL | Status: DC | PRN
  Administered 2015-09-20: 10 mg via ORAL
  Filled 2015-09-19: qty 1

## 2015-09-19 MED ORDER — METHYLPREDNISOLONE SODIUM SUCC 125 MG IJ SOLR
125.0000 mg | Freq: Once | INTRAMUSCULAR | Status: AC
  Administered 2015-09-19: 125 mg via INTRAVENOUS
  Filled 2015-09-19: qty 2

## 2015-09-19 MED ORDER — FLUOXETINE HCL 20 MG PO CAPS
60.0000 mg | ORAL_CAPSULE | Freq: Every day | ORAL | Status: DC
  Administered 2015-09-20: 60 mg via ORAL
  Filled 2015-09-19 (×2): qty 3

## 2015-09-19 MED ORDER — ENOXAPARIN SODIUM 40 MG/0.4ML ~~LOC~~ SOLN
40.0000 mg | SUBCUTANEOUS | Status: DC
  Administered 2015-09-19 – 2015-09-20 (×2): 40 mg via SUBCUTANEOUS
  Filled 2015-09-19 (×2): qty 0.4

## 2015-09-19 MED ORDER — KETOROLAC TROMETHAMINE 30 MG/ML IJ SOLN
30.0000 mg | Freq: Every day | INTRAMUSCULAR | Status: DC
  Administered 2015-09-20 – 2015-09-21 (×2): 30 mg via INTRAVENOUS
  Filled 2015-09-19 (×2): qty 1

## 2015-09-19 MED ORDER — KETOROLAC TROMETHAMINE 60 MG/2ML IM SOLN
60.0000 mg | Freq: Once | INTRAMUSCULAR | Status: AC
  Administered 2015-09-19: 60 mg via INTRAMUSCULAR

## 2015-09-19 MED ORDER — ONDANSETRON HCL 4 MG/2ML IJ SOLN
4.0000 mg | Freq: Once | INTRAMUSCULAR | Status: AC
  Administered 2015-09-19: 4 mg via INTRAVENOUS
  Filled 2015-09-19: qty 2

## 2015-09-19 MED ORDER — METHOCARBAMOL 500 MG PO TABS
500.0000 mg | ORAL_TABLET | Freq: Once | ORAL | Status: AC
  Administered 2015-09-19: 500 mg via ORAL
  Filled 2015-09-19: qty 1

## 2015-09-19 MED ORDER — HYDROMORPHONE HCL 1 MG/ML IJ SOLN
1.0000 mg | Freq: Once | INTRAMUSCULAR | Status: AC
  Administered 2015-09-19: 1 mg via INTRAVENOUS

## 2015-09-19 MED ORDER — LEDIPASVIR-SOFOSBUVIR 90-400 MG PO TABS
1.0000 | ORAL_TABLET | Freq: Every day | ORAL | Status: DC
  Administered 2015-09-21: 1 via ORAL

## 2015-09-19 MED ORDER — ONDANSETRON 4 MG PO TBDP
4.0000 mg | ORAL_TABLET | Freq: Once | ORAL | Status: AC
  Administered 2015-09-19: 4 mg via ORAL
  Filled 2015-09-19: qty 1

## 2015-09-19 MED ORDER — GABAPENTIN 300 MG PO CAPS
300.0000 mg | ORAL_CAPSULE | Freq: Three times a day (TID) | ORAL | Status: DC
  Administered 2015-09-19 – 2015-09-21 (×5): 300 mg via ORAL
  Filled 2015-09-19 (×5): qty 1

## 2015-09-19 MED ORDER — PREDNISONE 20 MG PO TABS: 60.0000 mg | ORAL_TABLET | Freq: Once | ORAL | Status: DC

## 2015-09-19 NOTE — ED Notes (Signed)
Family medicine at the bedside to assess patient.

## 2015-09-19 NOTE — Progress Notes (Signed)
Pt arrived to unit alert oriented without incident. Oriented to room, call bell at side. Bed alarm activated. Will continue to monitor. Bobbye Charleston, RN

## 2015-09-19 NOTE — ED Notes (Signed)
Attempted to ambulate pt in hallway, attempt was unsuccessful. Pt unable to bear weight on right leg, lost balance and nearly fell. Pt placed back in stretcher and PA at bedside discussing next step in care for pt.

## 2015-09-19 NOTE — ED Notes (Signed)
Per ems- pt comes from PCP, c/o middle back pain radiating down both legs. Was told at PCP was told she had sciatica. Has been falling several times due to pain making her legs give out. Was given 60 mg toradol at PCP. Pt is a x 4. BP 150/98, HR 70

## 2015-09-19 NOTE — ED Notes (Signed)
Second attempt at report

## 2015-09-19 NOTE — ED Provider Notes (Signed)
Patient initially seen and evaluated by [name], sent for MRI of lumbar spine because of intractable back pain. MRI does not show any acute process. Pain is not been able to be controlled in spite of multiple doses of hydromorphone and oral methocarbamol. Case is discussed with Dr. [name] of family medicine service who agrees to admit the patient for pain control.  Results for orders placed or performed during the hospital encounter of 09/19/15  CBC  Result Value Ref Range   WBC 7.6 4.0 - 10.5 K/uL   RBC 3.92 3.87 - 5.11 MIL/uL   Hemoglobin 12.3 12.0 - 15.0 g/dL   HCT 36.9 36.0 - 46.0 %   MCV 94.1 78.0 - 100.0 fL   MCH 31.4 26.0 - 34.0 pg   MCHC 33.3 30.0 - 36.0 g/dL   RDW 12.5 11.5 - 15.5 %   Platelets 129 (L) 150 - 400 K/uL  Basic metabolic panel  Result Value Ref Range   Sodium 138 135 - 145 mmol/L   Potassium 3.5 3.5 - 5.1 mmol/L   Chloride 104 101 - 111 mmol/L   CO2 25 22 - 32 mmol/L   Glucose, Bld 105 (H) 65 - 99 mg/dL   BUN 10 6 - 20 mg/dL   Creatinine, Ser 0.66 0.44 - 1.00 mg/dL   Calcium 9.4 8.9 - 10.3 mg/dL   GFR calc non Af Amer >60 >60 mL/min   GFR calc Af Amer >60 >60 mL/min   Anion gap 9 5 - 15   Dg Lumbar Spine Complete  09/19/2015  CLINICAL DATA:  Low back pain radiating down both legs 2 weeks EXAM: LUMBAR SPINE - COMPLETE 4+ VIEW COMPARISON:  None. FINDINGS: Normal alignment of lumbar vertebral bodies. No loss of vertebral body height or disc height. No pars fracture. No subluxation. There is endplate spurring at N1-A5 and L5-S1. IMPRESSION: No acute osseous abnormality. Electronically Signed   By: Suzy Bouchard M.D.   On: 09/19/2015 12:50   Dg Pelvis 1-2 Views  09/19/2015  CLINICAL DATA:  Back pain extending down both legs for 2 weeks. EXAM: PELVIS - 1-2 VIEW COMPARISON:  CT scan abdomen and pelvis dated 04/30/2012 FINDINGS: The osseous structures of the pelvis appear normal. Slight degenerative changes of the lower lumbar spine. Calcified fibroid in  the pelvis. IMPRESSION: No significant abnormalities of the pelvis. Degenerative disc disease in the lower lumbar spine. Electronically Signed   By: [name] M.D.   On: 09/19/2015 12:51   Mr Lumbar Spine Wo Contrast  09/19/2015  CLINICAL DATA:  Worsening severe low back pain extending into the right leg with right leg weakness. EXAM: MRI LUMBAR SPINE WITHOUT CONTRAST TECHNIQUE: Multiplanar, multisequence MR imaging of the lumbar spine was performed. No intravenous contrast was administered. COMPARISON:  Radiographs dated 11/18/2014 FINDINGS: Normal conus tip at L1. Chronic hepatomegaly. The gallbladder is quite distended, measuring 16.4 cm in length. No visible stones. Common bile duct is not distended. T12-L1 through L2-3:  Normal. L3-4: Small broad-based disc bulge with no neural impingement. Slight hypertrophy of the ligamentum flavum and facet joints with slight narrowing of the spinal canal without neural impingement. L4-5: Small broad-based disc protrusion most prominent into the right neural foramen but without impingement upon the exiting L4 nerve. No compression of the thecal sac. L5-S1: Small broad-based soft disc protrusion extending into both neural foramina. No impingement upon the thecal sac. Slight hypertrophy of the right facet joint and ligamentum flavum without neural impingement. The L5 nerves appear  to exit without impingement. IMPRESSION: Disc protrusions at L4-5 and L5-S1 without focal neural impingement. The gallbladder is quite distended. Electronically Signed   By: [name] M.D.   On: 09/19/2015 46:27      Latoya Swanson [birth year]09 [id number]

## 2015-09-19 NOTE — ED Notes (Signed)
Pt reports significant pain with movement. Unable to get out of stretcher to ambulate. PA aware and recommends additional pain medication dose.

## 2015-09-19 NOTE — ED Provider Notes (Signed)
CSN: [id number]     Arrival date & time 09/19/15  1035 History   First MD Initiated Contact with Patient 09/19/15 1044     Chief Complaint  Patient presents with  . Hip Pain     (Consider location/radiation/quality/duration/timing/severity/associated sxs/prior Treatment) HPI Comments: Patient with history of cervical radiculopathy, also intermittent lower back pain, sent from primary care physician office today -- with complaint of worsening severe lower back pain with radiation into her right leg. Patient states that her pain has been weak and has been giving out. Patient was seen by her PCP today, given IM Toradol without relief. She was then transferred to the emergency department for imaging and further pain control. Patient states that while she has had pain like this in the past, his never been this severe and persistent. Patient denies warning symptoms of back pain including: fecal incontinence, urinary retention or overflow incontinence, night sweats, waking from sleep with back pain, unexplained fevers or weight loss, h/o cancer, IVDU, recent trauma. No history of injections in her back. The onset of this condition was acute. The course is constant. Aggravating factors: movement/walking. Alleviating factors: none.    Patient is a 64 y.o. female presenting with hip pain. The history is provided by the patient.  Hip Pain Associated symptoms include myalgias. Pertinent negatives include no fever, numbness or weakness.    Past Medical History  Diagnosis Date  . 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  . Normal echocardiogram 08/19/2007  . Normal cardiac stress test 2008     [name]  . Hypertension   . Anxiety   . Depression   . GERD (gastroesophageal reflux disease)    Past Surgical History  Procedure Laterality Date  . Anal fissure repair    . Tubal ligation    . Carpal tunnel release      bilat  . Shoulder surgery      twice    Family  History  Problem Relation Age of Onset  . Coronary artery disease Father     died age 52  . Breast cancer Mother     died age 73  . Colon cancer Mother    Social History  Substance Use Topics  . Smoking status: Light Tobacco Smoker    Types: Cigarettes  . Smokeless tobacco: Never Used     Comment: 2-3 cigs a day  . Alcohol Use: No   OB History    No data available     Review of Systems  Constitutional: Negative for fever and unexpected weight change.  Gastrointestinal: Negative for constipation.       Negative for fecal incontinence.   Genitourinary: Negative for dysuria, hematuria, flank pain, vaginal bleeding, vaginal discharge and pelvic pain.       Negative for urinary incontinence or retention.  Musculoskeletal: Positive for myalgias and back pain.  Neurological: Negative for weakness and numbness.       Denies saddle paresthesias.   Allergies  Aspirin  Home Medications   Prior to Admission medications   Medication Sig Start Date End Date Taking? Authorizing Provider  amLODipine (NORVASC) 10 MG tablet TAKE 1 TABLET BY MOUTH DAILY 04/30/15  Yes [name], MD  aspirin 81 MG tablet Take 81 mg by mouth daily.     Yes Historical Provider, MD  baclofen (LIORESAL) 10 MG tablet TAKE 1 TABLET (10 MG TOTAL) BY MOUTH 2 (TWO) TIMES DAILY AS NEEDED FOR MUSCLE  SPASMS. 06/18/15  Yes [name], MD  Cholecalciferol (VITAMIN D-3) 1000 UNITS CAPS Take 1 capsule (1,000 Units total) by mouth daily. 02/24/13  Yes [name], MD  clotrimazole (LOTRIMIN) 1 % cream Apply 1 application topically 2 (two) times daily. 09/05/15  Yes [name], MD  Cyanocobalamin (VITAMIN B-12) 500 MCG SUBL Place 500 mcg under the tongue daily.    Yes Historical Provider, MD  diclofenac (VOLTAREN) 75 MG EC tablet TAKE 1 TABLET (75 MG TOTAL) BY MOUTH 2 (TWO) TIMES DAILY AS NEEDED. 06/18/15  Yes [name], MD  FLUoxetine (PROZAC) 20 MG capsule TAKE 3 CAPSULES BY MOUTH  EVERY DAY 04/30/15  Yes [name], MD  gabapentin (NEURONTIN) 300 MG capsule TAKE ONE CAPSULE BY MOUTH 3 TIMES A DAY 05/07/15  Yes [name], MD  hydrocortisone 2.5 % cream Apply topically 2 (two) times daily. 09/05/15  Yes [name], MD  Ledipasvir-Sofosbuvir (HARVONI) 90-400 MG TABS Take 1 tablet by mouth daily. 07/18/15  Yes Thayer Headings, MD  traMADol (ULTRAM) 50 MG tablet TAKE 1 TABLET BY MOUTH EVERY 6 HOURS AS NEEDED FOR PAIN 05/10/15  Yes [name], MD  triamterene-hydrochlorothiazide (MAXZIDE-25) 37.5-25 MG per tablet TAKE 1 TABLET BY MOUTH DAILY. 04/30/15  Yes [name], MD   BP 158/96 mmHg  Pulse 64  Temp(Src) 98.4 F (36.9 C) (Oral)  Resp 18  Ht 5\' 5"  (1.651 m)  Wt 180 lb (81.647 kg)  BMI 29.95 kg/m2  SpO2 99%  LMP 12/01/2001   Physical Exam  Constitutional: She appears well-developed and well-nourished.  HENT:  Head: Normocephalic and atraumatic.  Eyes: Conjunctivae are normal.  Neck: Normal range of motion. Neck supple.  Pulmonary/Chest: Effort normal.  Abdominal: Soft. There is no tenderness. There is no CVA tenderness.  Musculoskeletal: Normal range of motion.       Right hip: She exhibits tenderness. She exhibits no bony tenderness.       Left hip: Normal.       Right knee: Normal.       Left knee: Normal.       Right ankle: Normal.       Left ankle: Normal.       Cervical back: Normal.       Thoracic back: Normal.       Lumbar back: She exhibits tenderness. She exhibits normal range of motion and no bony tenderness.       Right upper leg: She exhibits tenderness. She exhibits no bony tenderness and no swelling.       Right lower leg: Normal.       Right foot: Normal.  No step-off noted with palpation of spine.   Neurological: She is alert. She has normal strength and normal reflexes. No sensory deficit.  5/5 strength in entire lower extremities bilaterally. No sensation deficit.   Skin: Skin is warm and dry.  No rash noted.  Psychiatric: She has a normal mood and affect.  Nursing note and vitals reviewed.   ED Course  Procedures (including critical care time) Labs Review Labs Reviewed  CBC - Abnormal; Notable for the following:    Platelets 129 (*)    All other components within normal limits  BASIC METABOLIC PANEL    Imaging Review Dg Lumbar Spine Complete  09/19/2015  CLINICAL DATA:  Low back pain radiating down both legs 2 weeks EXAM: LUMBAR SPINE - COMPLETE 4+ VIEW COMPARISON:  None. FINDINGS: Normal alignment of lumbar  vertebral bodies. No loss of vertebral body height or disc height. No pars fracture. No subluxation. There is endplate spurring at K8-J6 and L5-S1. IMPRESSION: No acute osseous abnormality. Electronically Signed   By: Suzy Bouchard M.D.   On: 09/19/2015 12:50   Dg Pelvis 1-2 Views  09/19/2015  CLINICAL DATA:  Back pain extending down both legs for 2 weeks. EXAM: PELVIS - 1-2 VIEW COMPARISON:  CT scan abdomen and pelvis dated 04/30/2012 FINDINGS: The osseous structures of the pelvis appear normal. Slight degenerative changes of the lower lumbar spine. Calcified fibroid in the pelvis. IMPRESSION: No significant abnormalities of the pelvis. Degenerative disc disease in the lower lumbar spine. Electronically Signed   By: [name] M.D.   On: 09/19/2015 12:51   I have personally reviewed and evaluated these images and lab results as part of my medical decision-making.   EKG Interpretation None       11:12 AM Patient seen and examined. Work-up initiated. Medications ordered.   Vital signs reviewed and are as follows: BP 158/96 mmHg  Pulse 64  Temp(Src) 98.4 F (36.9 C) (Oral)  Resp 18  Ht 5\' 5"  (1.651 m)  Wt 180 lb (81.647 kg)  BMI 29.95 kg/m2  SpO2 99%  LMP 12/01/2001  3:19 PM after patient received IM Dilaudid, pain was improved. She went x-ray where pain was once again exacerbated. Patient given additional dose of IM Dilaudid and attempted to ambulate.  Her right leg is very weak and she cannot stand. At this point an IV was placed, labs were drawn, steroids ordered. Will need to get MRI as patient is unable to walk to assess for more significant etiology. I suspect patient likely has nerve root compression causing right-sided lumbar radiculopathy.  Handoff to oncoming team at shift change. Plan: Additional analgesia, MRI. If not improved, may need admission.   MDM   Final diagnoses:  Back pain  Lumbar radiculopathy   Back pain: unable to ambulate. MRI pending.     Carlisle Cater, PA-C 09/19/15 [facility] [place], MD 09/19/15 [phone]

## 2015-09-19 NOTE — Progress Notes (Signed)
   Subjective:    Patient ID: Latoya Swanson, female    DOB: [birth year], 64 y.o.   MRN: [id number]  HPI  R Hip Pain Worsening over the last few days after several weeks of milder pain.  No trauma no incontinence or specific weakness but her leg has given way several times this week - fell in the lobby trying to sit down.  Did not hit head and does not feel hurt anything.  Has been taking her chronic pain medications gabapentin  Diclofencac, tramadol without relief  PMH - has [place] history of disk disease mostly cervical Hep C being treated    Review of Systems     Objective:   Physical Exam Moderate- severe distress sitting in wheel chair can make it to bed but with significant assistance Normal strength plantar flex exten on R foot  Increased pain with hip ROM and with knee flex extension  Can't sit No focal tenderness of hip or back but diffusely tender with any movement No head or neck tenderness  Given toradol 60 mg IM with little relief after 45 minutes Unable to stand or to position herself on the cot      Assessment & Plan:

## 2015-09-19 NOTE — ED Notes (Signed)
Pt states does not use walker or cane for mobility assistance.

## 2015-09-19 NOTE — Assessment & Plan Note (Addendum)
New onset but likely related to acute exacerbation of chronic lumbar disk disease.  No cauda equina symptoms or definite weakness but is unable to walk secondary to pain and has fallen recenlty.   Did not improve with toradol injection.   Will transport to ED for further evaluation and therapy given inability to walk.  Likely need plain hip films given has some exacerbation with hip range of motion and tenderness over the hip region.   Recent MRI of neck but has not had one of lower back in years.

## 2015-09-19 NOTE — ED Notes (Signed)
Report called to [facility], RN 60M.

## 2015-09-19 NOTE — H&P (Signed)
[facility] Admission History and Physical Service Pager: [phone]  Patient name: Latoya Swanson Medical record number: [id number] Date of birth: 1951/09/04 Age: 64 y.o. Gender: female  Primary Care Provider: [name], MD Consultants: none Code Status: DNR  Chief Complaint: intractable back pain  Assessment and Plan: Latoya Swanson is a 64 y.o. female presenting with intractable back pain. PMH is significant for chronic lumbar disk disease, Chronic Hep C, HTN, GERD, Depression.  Back pain with right sided sciatica: pain with also radiation to groin. No red flags: no urinary incontinence/retention, bowel incontinence, or saddle anesthesia. MRI lumbar spine: Disc protrusions at L4-5 and L5-S1 without focal neural impingement. Does have a recent MRI c-spine as well with some findings. Lumbar spine X-ray: no acute osseous abnormality; endplate spurring at S2-8 and L5-S1. X-ray Pelvis: No significant abnormalities of pelvis DD in lower lumbar spine.  - continue home Baclofen 10mg  BID PRN - Dilaudid 0.5mg  q 4 PRN (severe pain)  - Roxicodone 5mg  q 4 PRN (moderate pain)  - Toradol 20mg  daily  - hold home tramadol - cont home Gabapentin  - PT consult  - repeat MRI c-spine, can also consider t-spine  Dysuria: Notes of urinary urgency and dark urine - Urinalysis   Chronic Hepatitis C: near end of harvoni course, most recent HCV RNA quant 08/16/15 was undetectable. - cont home Ledipasvir-Sofosbuvir  HTN: stable, at goal - cont Amlodipine - cont Maxzide   GERD: Stable - Protonix   Depression: stable - Fluoxetine  FEN/GI: heart diet Prophylaxis: Lovenox  Disposition: home pending pain control   History of Present Illness:  Latoya Swanson is a 64 y.o. female presenting with back pain unrelieved with therapy in the ED.  Patient notes of low back pain that began last week but worsened over the weekend. She has had a few falls in the past few days, where  her right leg "gave out"; patient did not hit her head; her left wrist is mildly sore but no injury from fall. This pain is not similar to her chronic. Pain starts in her lumbar back and radiates to her leg; associated with weakness and tingling. Right sided hip pain is worse with weight bearing and bending of back. She was seen by Dr. Erin Hearing 11/9 AM and was advised to go to the ED for further evaluation. Pain meds in ED helped reduce 50% of pain, however patient was not able to ambulate in the ED due to severe pain.  Pain is never under 6/10. Denies urinary incontinence/retention, bowel incontinence. Patient has never seen an orthopedics in the past. No fevers, chills, dysphagia, sob, chest pain. Home pain medications include Baclofen, Tramadol, Gabapentin, Voltaren Also notes of right shoulder pain and neck pain on right side.  No Dysuria, but notes of dark urine since being in the ED. Notes of some urgency.  No smoking, no alcohol; occasionally smokes marijuana No allergies Lives with husband, best friend and dog  In the ED, patient received multiple doses of Dialudid (total 5.5mg ), and robaxin x 1, and Prednisone 60mg .   Review Of Systems: Per HPI  Otherwise the remainder of the systems were negative.  Patient Active Problem List   Diagnosis Date Noted  . Pain in right hip 09/19/2015  . Ingrown toenail 09/05/2015  . 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 Hepatic cirrhosis ([place]) 07/18/2015  . Former smoker 03/03/2012  . 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 Cervical radiculitis 06/29/2009  . OVERWEIGHT 03/08/2009  .  Degeneration of lumbar or lumbosacral intervertebral disc 12/25/2007  . Depression 07/05/2007  . Essential hypertension 07/05/2007  . GERD 07/05/2007  . HYPERCHOLESTEROLEMIA, MILD 03/05/2007  . Chronic hepatitis C without hepatic coma ([place]) 04/18/2005    Past Medical History: Past Medical History  Diagnosis Date  . History of MRI of spine     Right foraminal stenosis at  C2-3 due to spurring.  . Normal echocardiogram 08/19/2007  . Normal cardiac stress test 2008     [name]  . Hypertension   . Anxiety   . Depression   . GERD (gastroesophageal reflux disease)     Past Surgical History: Past Surgical History  Procedure Laterality Date  . Anal fissure repair    . Tubal ligation    . Carpal tunnel release      bilat  . Shoulder surgery      twice     Social History: Social History  Substance Use Topics  . Smoking status: Light Tobacco Smoker    Types: Cigarettes  . Smokeless tobacco: Never Used     Comment: 2-3 cigs a day  . Alcohol Use: No    Please also refer to relevant sections of EMR.  Family History: Family History  Problem Relation Age of Onset  . Coronary artery disease Father     died age 5  . Breast cancer Mother     died age 56  . Colon cancer Mother     Allergies and Medications: Allergies  Allergen Reactions  . Aspirin     Causes stomach bleeds hx of ulcers   No current facility-administered medications on file prior to encounter.   Current Outpatient Prescriptions on File Prior to Encounter  Medication Sig Dispense Refill  . amLODipine (NORVASC) 10 MG tablet TAKE 1 TABLET BY MOUTH DAILY 30 tablet 4  . aspirin 81 MG tablet Take 81 mg by mouth daily.      . baclofen (LIORESAL) 10 MG tablet TAKE 1 TABLET (10 MG TOTAL) BY MOUTH 2 (TWO) TIMES DAILY AS NEEDED FOR MUSCLE SPASMS. 60 tablet 11  . Cholecalciferol (VITAMIN D-3) 1000 UNITS CAPS Take 1 capsule (1,000 Units total) by mouth daily. 30 capsule 6  . clotrimazole (LOTRIMIN) 1 % cream Apply 1 application topically 2 (two) times daily. 45 g 2  . Cyanocobalamin (VITAMIN B-12) 500 MCG SUBL Place 500 mcg under the tongue daily.     . diclofenac (VOLTAREN) 75 MG EC tablet TAKE 1 TABLET (75 MG TOTAL) BY MOUTH 2 (TWO) TIMES DAILY AS NEEDED. 60 tablet 11  . FLUoxetine (PROZAC) 20 MG capsule TAKE 3 CAPSULES BY MOUTH EVERY DAY 90 capsule 4  . gabapentin (NEURONTIN) 300 MG  capsule TAKE ONE CAPSULE BY MOUTH 3 TIMES A DAY 90 capsule 3  . hydrocortisone 2.5 % cream Apply topically 2 (two) times daily. 45 g 2  . Ledipasvir-Sofosbuvir (HARVONI) 90-400 MG TABS Take 1 tablet by mouth daily. 28 tablet 2  . traMADol (ULTRAM) 50 MG tablet TAKE 1 TABLET BY MOUTH EVERY 6 HOURS AS NEEDED FOR PAIN 100 tablet 2  . triamterene-hydrochlorothiazide (MAXZIDE-25) 37.5-25 MG per tablet TAKE 1 TABLET BY MOUTH DAILY. 30 tablet 4    Objective: BP 141/89 mmHg  Pulse 65  Temp(Src) 98.4 F (36.9 C) (Oral)  Resp 18  Ht 5\' 5"  (1.651 m)  Wt 180 lb (81.647 kg)  BMI 29.95 kg/m2  SpO2 99%  LMP 12/01/2001 Exam: General: NAD HEENT: Atraumatic, normocephalic, neck supple, EOMI, sclera clear  CV: RRR,  no murmurs, rubs, or gallops PULM: CTAB, normal effort ABD: Soft, nontender, nondistended, NABS, no organomegaly SKIN: No rash or cyanosis; warm and well-perfused EXTR: No lower extremity edema or calf tenderness MSK: tenderness to palpation of spine below ~T8 along with paraspinal muscle tenderness to palpation; positive straight leg raise test on Right Leg; 4/5 strength in right leg limited by pain; 5/5 strength in left LE. Patellar reflexes normal bilaterally  PSYCH: Mood and affect euthymic, normal rate and volume of speech NEURO: Awake, alert, no focal deficits grossly, normal speech   Labs and Imaging: CBC BMET   Recent Labs Lab 09/19/15 1452  WBC 7.6  HGB 12.3  HCT 36.9  PLT 129*    Recent Labs Lab 09/19/15 1452  NA 138  K 3.5  CL 104  CO2 25  BUN 10  CREATININE 0.66  GLUCOSE 105*  CALCIUM 9.4     MRI Lumbar Spine: Disc protrusions at L4-5 and L5-S1 without focal neural impingement; distended  gallbladder without stones; chronic hepatomegaly Lumbar spine X-ray: no acute osseous abnormality; endplate spurring at B5-1 and L5-S1.  X-ray Pelvis: No significant abnormalities of pelvis DD in lower lumbar spine   Smiley Houseman, MD 09/19/2015, 6:52 PM PGY-1,  [place] Intern pager: [phone], text pages welcome  I have seen and examined the patient. I have read and agree with the above note. My changes are noted in blue.  Tawanna Sat, MD 09/19/2015, 11:45 PM PGY-3, [place] Intern Pager: [phone], text pages welcome

## 2015-09-19 NOTE — ED Notes (Signed)
Attempted report 

## 2015-09-19 NOTE — Progress Notes (Signed)
[facility] Admission History and Physical Service Pager: [phone]  Patient name: Latoya Swanson Medical record number: [id number] Date of birth: [birth year] Age: 64 y.o. Gender: female  Primary Care Provider: [name], MD Consultants: none Code Status: DNR  Chief Complaint: intractable back pain  Assessment and Plan: Latoya Swanson is a 64 y.o. female presenting with intractable back pain. PMH is significant for chronic lumbar disk disease, Chronic Hep C, HTN, GERD, Depression.  Back pain with right sided sciatica: pain with also radiation to groin. No red flags: no urinary incontinence/retention, bowel incontinence, or saddle anesthesia. MRI lumbar spine: Disc protrusions at L4-5 and L5-S1 without focal neural impingement. Lumbar spine X-ray: no acute osseous abnormality; endplate spurring at J0-0 and L5-S1. X-ray Pelvis: No significant abnormalities of pelvis DD in lower lumbar spine.  - continue home Baclofen 10mg  BID PRN - Dilaudid 0.5mg  q 4 PRN (severe pain)  - Roxicodone 5mg  q 4 PRN (moderate pain)  - Toradol 20mg  daily  - cont home Gabapentin  - PT consult   Dysuria: Notes of urinary urgency and dark urine - Urinalysis   Chronic Hepatitis C: - cont home Ledipasvir-Sofosbuvir  HTN: - cont Amlodipine - cont Maxzide   GERD: - Protonix   Depression:  - Fluoxetine  FEN/GI: heart diet Prophylaxis: Lovenox  Disposition: home pending pain control   History of Present Illness:  Latoya Swanson is a 64 y.o. female presenting with back pain unrelieved with therapy in the ED.  Patient notes of low back pain that began last week but worsened over the weekend. She has had a few falls in the past few days, where her right leg "gave out"; patient did not hit her head; her left wrist is mildly sore but no injury from fall. This pain is not similar to her chronic. Pain starts in her lumbar back and radiates to her leg; associated with weakness and  tingling. Right sided hip pain is worse with weight bearing and bending of back. She was seen by Dr. Erin Hearing 11/9 AM and was advised to go to the ED for further evaluation. Pain meds in ED helped reduce 50% of pain, however patient was not able to ambulate in the ED due to severe pain.  Pain is never under 6/10. Denies urinary incontinence/retention, bowel incontinence. Patient has never seen an orthopedics in the past. No fevers, chills, dysphagia, sob, chest pain. Home pain medications include Baclofen, Tramadol, Gabapentin, Voltaren Also notes of right shoulder pain and neck pain on right side.  No Dysuria, but notes of dark urine since being in the ED. Notes of some urgency.  No smoking, no alcohol; occasionally smokes marijuana No allergies Lives with husband, best friend and dog  In the ED, patient received multiple doses of Dialudid (total 5.5mg ), and robaxin x 1, and Prednisone 60mg .   Review Of Systems: Per HPI  Otherwise the remainder of the systems were negative.  Patient Active Problem List   Diagnosis Date Noted  . Pain in right hip 09/19/2015  . Ingrown toenail 09/05/2015  . 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 Hepatic cirrhosis ([place]) 07/18/2015  . Former smoker 03/03/2012  . 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 Cervical radiculitis 06/29/2009  . OVERWEIGHT 03/08/2009  . Degeneration of lumbar or lumbosacral intervertebral disc 12/25/2007  . Depression 07/05/2007  . Essential hypertension 07/05/2007  . GERD 07/05/2007  . HYPERCHOLESTEROLEMIA, MILD 03/05/2007  . Chronic hepatitis C without hepatic coma ([place]) 04/18/2005    Past  Medical History: Past Medical History  Diagnosis Date  . 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  . Normal echocardiogram 08/19/2007  . Normal cardiac stress test 2008     [name]  . Hypertension   . Anxiety   . Depression   . GERD (gastroesophageal reflux disease)     Past Surgical History: Past  Surgical History  Procedure Laterality Date  . Anal fissure repair    . Tubal ligation    . Carpal tunnel release      bilat  . Shoulder surgery      twice     Social History: Social History  Substance Use Topics  . Smoking status: Light Tobacco Smoker    Types: Cigarettes  . Smokeless tobacco: Never Used     Comment: 2-3 cigs a day  . Alcohol Use: No    Please also refer to relevant sections of EMR.  Family History: Family History  Problem Relation Age of Onset  . Coronary artery disease Father     died age 89  . Breast cancer Mother     died age 72  . Colon cancer Mother     Allergies and Medications: Allergies  Allergen Reactions  . Aspirin     Causes stomach bleeds hx of ulcers   No current facility-administered medications on file prior to encounter.   Current Outpatient Prescriptions on File Prior to Encounter  Medication Sig Dispense Refill  . amLODipine (NORVASC) 10 MG tablet TAKE 1 TABLET BY MOUTH DAILY 30 tablet 4  . aspirin 81 MG tablet Take 81 mg by mouth daily.      . baclofen (LIORESAL) 10 MG tablet TAKE 1 TABLET (10 MG TOTAL) BY MOUTH 2 (TWO) TIMES DAILY AS NEEDED FOR MUSCLE SPASMS. 60 tablet 11  . Cholecalciferol (VITAMIN D-3) 1000 UNITS CAPS Take 1 capsule (1,000 Units total) by mouth daily. 30 capsule 6  . clotrimazole (LOTRIMIN) 1 % cream Apply 1 application topically 2 (two) times daily. 45 g 2  . Cyanocobalamin (VITAMIN B-12) 500 MCG SUBL Place 500 mcg under the tongue daily.     . diclofenac (VOLTAREN) 75 MG EC tablet TAKE 1 TABLET (75 MG TOTAL) BY MOUTH 2 (TWO) TIMES DAILY AS NEEDED. 60 tablet 11  . FLUoxetine (PROZAC) 20 MG capsule TAKE 3 CAPSULES BY MOUTH EVERY DAY 90 capsule 4  . gabapentin (NEURONTIN) 300 MG capsule TAKE ONE CAPSULE BY MOUTH 3 TIMES A DAY 90 capsule 3  . hydrocortisone 2.5 % cream Apply topically 2 (two) times daily. 45 g 2  . Ledipasvir-Sofosbuvir (HARVONI) 90-400 MG TABS Take 1 tablet by mouth daily. 28 tablet 2  .  traMADol (ULTRAM) 50 MG tablet TAKE 1 TABLET BY MOUTH EVERY 6 HOURS AS NEEDED FOR PAIN 100 tablet 2  . triamterene-hydrochlorothiazide (MAXZIDE-25) 37.5-25 MG per tablet TAKE 1 TABLET BY MOUTH DAILY. 30 tablet 4    Objective: BP 141/89 mmHg  Pulse 65  Temp(Src) 98.4 F (36.9 C) (Oral)  Resp 18  Ht 5\' 5"  (1.651 m)  Wt 180 lb (81.647 kg)  BMI 29.95 kg/m2  SpO2 99%  LMP 12/01/2001 Exam: General: NAD HEENT: Atraumatic, normocephalic, neck supple, EOMI, sclera clear  CV: RRR, no murmurs, rubs, or gallops PULM: CTAB, normal effort ABD: Soft, nontender, nondistended, NABS, no organomegaly SKIN: No rash or cyanosis; warm and well-perfused EXTR: No lower extremity edema or calf tenderness MSK: tenderness to palpation of spine below ~T8  along with paraspinal muscle tenderness to palpation; positive straight leg raise test on Right Leg; 4/5 strength in right leg limited by pain; 5/5 strength in left LE. Patellar reflexes normal bilaterally  PSYCH: Mood and affect euthymic, normal rate and volume of speech NEURO: Awake, alert, no focal deficits grossly, normal speech   Labs and Imaging: CBC BMET   Recent Labs Lab 09/19/15 1452  WBC 7.6  HGB 12.3  HCT 36.9  PLT 129*    Recent Labs Lab 09/19/15 1452  NA 138  K 3.5  CL 104  CO2 25  BUN 10  CREATININE 0.66  GLUCOSE 105*  CALCIUM 9.4     MRI Lumbar Spine: Disc protrusions at L4-5 and L5-S1 without focal neural impingement; distended  gallbladder without stones; chronic hepatomegaly Lumbar spine X-ray: no acute osseous abnormality; endplate spurring at E0-8 and L5-S1.  X-ray Pelvis: No significant abnormalities of pelvis DD in lower lumbar spine   Smiley Houseman, MD 09/19/2015, 6:52 PM PGY-1, [place] Intern pager: [phone], text pages welcome

## 2015-09-20 ENCOUNTER — Observation Stay (HOSPITAL_COMMUNITY): Payer: Commercial Managed Care - HMO

## 2015-09-20 DIAGNOSIS — R5383 Other fatigue: Secondary | ICD-10-CM | POA: Insufficient documentation

## 2015-09-20 DIAGNOSIS — B182 Chronic viral hepatitis C: Secondary | ICD-10-CM | POA: Diagnosis not present

## 2015-09-20 DIAGNOSIS — M549 Dorsalgia, unspecified: Secondary | ICD-10-CM | POA: Diagnosis not present

## 2015-09-20 DIAGNOSIS — K219 Gastro-esophageal reflux disease without esophagitis: Secondary | ICD-10-CM | POA: Diagnosis not present

## 2015-09-20 DIAGNOSIS — R52 Pain, unspecified: Secondary | ICD-10-CM | POA: Insufficient documentation

## 2015-09-20 DIAGNOSIS — R6889 Other general symptoms and signs: Secondary | ICD-10-CM | POA: Diagnosis not present

## 2015-09-20 DIAGNOSIS — M5416 Radiculopathy, lumbar region: Secondary | ICD-10-CM

## 2015-09-20 DIAGNOSIS — R3915 Urgency of urination: Secondary | ICD-10-CM | POA: Diagnosis not present

## 2015-09-20 DIAGNOSIS — M5412 Radiculopathy, cervical region: Secondary | ICD-10-CM | POA: Diagnosis not present

## 2015-09-20 DIAGNOSIS — Z66 Do not resuscitate: Secondary | ICD-10-CM | POA: Diagnosis not present

## 2015-09-20 DIAGNOSIS — M546 Pain in thoracic spine: Secondary | ICD-10-CM | POA: Diagnosis not present

## 2015-09-20 DIAGNOSIS — R531 Weakness: Secondary | ICD-10-CM

## 2015-09-20 DIAGNOSIS — F329 Major depressive disorder, single episode, unspecified: Secondary | ICD-10-CM | POA: Diagnosis not present

## 2015-09-20 DIAGNOSIS — M5020 Other cervical disc displacement, unspecified cervical region: Secondary | ICD-10-CM | POA: Diagnosis not present

## 2015-09-20 DIAGNOSIS — M5134 Other intervertebral disc degeneration, thoracic region: Secondary | ICD-10-CM | POA: Diagnosis not present

## 2015-09-20 DIAGNOSIS — I10 Essential (primary) hypertension: Secondary | ICD-10-CM | POA: Diagnosis not present

## 2015-09-20 LAB — URINALYSIS W MICROSCOPIC (NOT AT ARMC)
BILIRUBIN URINE: NEGATIVE
GLUCOSE, UA: NEGATIVE mg/dL
HGB URINE DIPSTICK: NEGATIVE
KETONES UR: NEGATIVE mg/dL
LEUKOCYTES UA: NEGATIVE
NITRITE: NEGATIVE
PH: 6 (ref 5.0–8.0)
Protein, ur: NEGATIVE mg/dL
Specific Gravity, Urine: 1.014 (ref 1.005–1.030)
UROBILINOGEN UA: 1 mg/dL (ref 0.0–1.0)

## 2015-09-20 LAB — BASIC METABOLIC PANEL
ANION GAP: 7 (ref 5–15)
BUN: 15 mg/dL (ref 6–20)
CO2: 27 mmol/L (ref 22–32)
Calcium: 9.4 mg/dL (ref 8.9–10.3)
Chloride: 102 mmol/L (ref 101–111)
Creatinine, Ser: 0.94 mg/dL (ref 0.44–1.00)
GLUCOSE: 97 mg/dL (ref 65–99)
POTASSIUM: 3.7 mmol/L (ref 3.5–5.1)
SODIUM: 136 mmol/L (ref 135–145)

## 2015-09-20 LAB — C-REACTIVE PROTEIN: CRP: <0.5 mg/dL (ref <1.0)

## 2015-09-20 LAB — SEDIMENTATION RATE: Sed Rate: 13 mm/hr (ref 0–22)

## 2015-09-20 MED ORDER — GADOBENATE DIMEGLUMINE 529 MG/ML IV SOLN
15.0000 mL | Freq: Once | INTRAVENOUS | Status: AC
  Administered 2015-09-20: 15 mL via INTRAVENOUS

## 2015-09-20 MED ORDER — CLOTRIMAZOLE 1 % EX CREA
TOPICAL_CREAM | Freq: Two times a day (BID) | CUTANEOUS | Status: DC
  Administered 2015-09-20 – 2015-09-21 (×3): via TOPICAL
  Filled 2015-09-20: qty 15

## 2015-09-20 NOTE — Progress Notes (Signed)
Interim Progress Note: Spoke with Latoya Quill, PA (Neurology) about Latoya Swanson. He stated that he does not think they need to be consulted regarding her care, because there is not much they can do for her. He recommended that we get a sed rate, CRP, and rheumatoid factor. He also stated that if we are really concerned, we could consider getting an MRI of her pelvis to make sure there is not a mass putting pressure on her sciatic nerve. We could do this without contrast because we would still be able to see inflammation in the muscles. He also stated that we could consider setting her up with Neuro as an outpatient for nerve conduction studies. We appreciate their input.  Latoya Bible, MD PGY-1

## 2015-09-20 NOTE — Evaluation (Signed)
Physical Therapy Evaluation Patient Details Name: Latoya Swanson MRN: [id number] DOB: 25-Dec-1950 Today's Date: 09/20/2015   History of Present Illness  Patient is a 64 y/o female presents with intractable back pain with sciatica down RLE. MRI lumbar spine: Disc protrusions at L4-5 and L5-S1 without focal neural impingement. MRI C-spine-MRI c-spine: moderate right foraminal stenosis at C5-C6 and moderate left foraminal stenosis at C3-C4 due to spurring and facet disease, potentially causing C4, C6 radicular symptoms. PMH is significant for chronic lumbar disk disease, Chronic Hep C, HTN, GERD, Depression.  Clinical Impression  Patient presents with back pain radiating into RLE impacting mobility assessment. Tolerated standing and short distance ambulation with Min A for balance/safety. Pt needs to be more mobile prior to returning home as spouse will not be able to assist physically due to own health issues. Recommend neurosurgery consult. Discussed positioning and movements to avoid due to known disc protrusions. Position of comfort is left lateral lean and side bend. Recommend use of RW until pain/strength improves. Will follow acutely to maximize independence and mobility prior to return home.     Follow Up Recommendations Home health PT;Supervision/Assistance - 24 hour    Equipment Recommendations  Rolling walker with 5" wheels    Recommendations for Other Services OT consult     Precautions / Restrictions Precautions Precautions: Fall;Back Precaution Booklet Issued: No Restrictions Weight Bearing Restrictions: No      Mobility  Bed Mobility Overal bed mobility: Needs Assistance Bed Mobility: Supine to Sit     Supine to sit: Min guard;HOB elevated     General bed mobility comments: Cues for log roll technique however pt wanted to show how she gets out of bed at home. Use of rails. Increased time.  Transfers Overall transfer level: Needs assistance Equipment used: Rolling  walker (2 wheeled) Transfers: Sit to/from Stand Sit to Stand: Min assist         General transfer comment: Increased time to rise from EOB with cues for hand placement. Pt gets stuck in forward flexion with trembling noted with increased time and difficulty performing lumbar extension to obtain upright. Increased pain. transferred to chair post ambulation bout.  Ambulation/Gait Ambulation/Gait assistance: Min assist Ambulation Distance (Feet): 7 Feet Assistive device: Rolling walker (2 wheeled) Gait Pattern/deviations: Step-to pattern;Decreased stance time - right;Decreased step length - left;Trunk flexed   Gait velocity interpretation: <1.8 ft/sec, indicative of risk for recurrent falls General Gait Details: Reluctant to place RLE on floor due to pain. Increased WB through BUEs due to pain through RLE with WB. Min A for balance. Cues for sequencing.  Stairs            Wheelchair Mobility    Modified Rankin (Stroke Patients Only)       Balance Overall balance assessment: Needs assistance Sitting-balance support: Feet supported;No upper extremity supported Sitting balance-Leahy Scale: Fair     Standing balance support: During functional activity Standing balance-Leahy Scale: Poor Standing balance comment: Relient on RW for support.                              Pertinent Vitals/Pain Pain Assessment: Faces Faces Pain Scale: Hurts worst Pain Location: back into RLE with mobility. 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 Started using [facility]PC recently due to multiple falls and RLE giving out.     Hand Dominance        Extremity/Trunk Assessment   Upper Extremity Assessment: Defer to OT evaluation           Lower Extremity Assessment: Generalized weakness;RLE deficits/detail RLE Deficits / Details: Not formally assessed due to pain wtih knee extension and hip flexion.        Communication   Communication: No difficulties  Cognition Arousal/Alertness: Awake/alert Behavior During Therapy: WFL for tasks assessed/performed Overall Cognitive Status: Within Functional Limits for tasks assessed                      General Comments General comments (skin integrity, edema, etc.): Forward spine flexion- + concordant symptoms, right SB- + concordant symptoms, spinal extension - no pain, left SB- relieves symptoms. Positive Lhermitte's sign. Increased pain with S1-2 myotomal testing.    Exercises        Assessment/Plan    PT Assessment Patient needs continued PT services  PT Diagnosis Difficulty walking;Acute pain   PT Problem List Decreased strength;Pain;Impaired sensation;Decreased activity tolerance;Decreased balance;Decreased mobility;Decreased knowledge of use of DME  PT Treatment Interventions Balance training;Gait training;Functional mobility training;Therapeutic activities;Therapeutic exercise;Patient/family education;Stair training   PT Goals (Current goals can be found in the Care Plan section) Acute Rehab PT Goals Patient Stated Goal: to get back to life  PT Goal Formulation: With patient Time For Goal Achievement: 10/04/15 Potential to Achieve Goals: Fair    Frequency Min 4X/week   Barriers to discharge Decreased caregiver support Pt reports her spouse has health issues and will not be able to assist at home.     Co-evaluation               End of Session Equipment Utilized During Treatment: Gait belt Activity Tolerance: Patient limited by pain Patient left: in chair;with call bell/phone within reach (positioned on left side with pillow undr right buttock with foot supported for comfort. ) Nurse Communication: Mobility status    Functional Assessment Tool Used: Clinical judgment Functional Limitation: Mobility: Walking and moving around Mobility: Walking and Moving Around Current Status [id number]): At least 20 percent but less than 40 percent impaired, limited or restricted Mobility: Walking and Moving Around Goal Status [phone]): At least 1 percent but less than 20 percent impaired, limited or restricted    Time: 1016-1051 PT Time Calculation (min) (ACUTE ONLY): 35 min   Charges:   PT Evaluation $Initial PT Evaluation Tier I: 1 Procedure PT Treatments $Gait Training: 8-22 mins   PT G Codes:   PT G-Codes **NOT FOR INPATIENT CLASS** Functional Assessment Tool Used: Clinical judgment Functional Limitation: Mobility: Walking and moving around Mobility: Walking and Moving Around Current Status [id number]): At least 20 percent but less than 40 percent impaired, limited or restricted Mobility: Walking and Moving Around Goal Status [phone]): At least 1 percent but less than 20 percent impaired, limited or restricted    [place] 09/20/2015, 11:03 AM Wray Kearns, PT, DPT [phone]

## 2015-09-20 NOTE — Progress Notes (Signed)
Nutrition Brief Note  Patient identified on the Malnutrition Screening Tool (MST) Report  Wt Readings from Last 15 Encounters:  09/19/15 188 lb 12.8 oz (85.639 kg)  09/05/15 184 lb (83.462 kg)  07/18/15 184 lb (83.462 kg)  06/13/15 184 lb (83.462 kg)  02/21/15 191 lb 3 oz (86.722 kg)  01/31/15 188 lb 4 oz (85.39 kg)  08/21/14 192 lb (87.091 kg)  07/13/14 186 lb (84.369 kg)  04/17/14 189 lb (85.73 kg)  07/08/13 192 lb (87.091 kg)  05/18/13 193 lb (87.544 kg)  04/20/13 197 lb (89.359 kg)  02/23/13 191 lb (86.637 kg)  01/26/13 198 lb (89.812 kg)  12/27/12 199 lb (90.266 kg)    Body mass index is 31.42 kg/(m^2). Patient meets criteria for Obesity based on current BMI. Pt states that for the past few weeks she has had a decreased appetite and has been eating about 30% less than usual, causing a 4 lbs weight loss. She states that her appetite has since improved and she is now eating well. She reports eating a healthful diet with fruits, vegetables, lean protein and smoothies.   Current diet order is Heart Healthy, patient is consuming approximately 100% of meals at this time. Labs and medications reviewed.   No nutrition interventions warranted at this time. If nutrition issues arise, please consult RD.   Scarlette Ar RD, LDN Inpatient Clinical Dietitian Pager: [phone] After Hours Pager: [phone]

## 2015-09-20 NOTE — Progress Notes (Signed)
Family Medicine Teaching Service Daily Progress Note Intern Pager: [phone]  Patient name: Latoya Swanson Medical record number: [id number] Date of birth: 1951-10-05 Age: 64 y.o. Gender: female  Primary Care Provider: [name], MD Consultants: None Code Status: DNR  Pt Overview and Major Events to Date:  11/9: Admitted to [place] with intractable back pain.  Assessment and Plan: Latoya Swanson is a 64 y.o. female presenting with intractable back pain. PMH is significant for chronic lumbar disk disease, Chronic Hep C, HTN, GERD, Depression.  Back pain with right sided sciatica: pain with radiation to groin. No red flags. Lumbar spine X-ray: no acute osseous abnormality; endplate spurring at 075-GRM and L5-S1. X-ray Pelvis: No significant abnormalities of pelvis DD in lower lumbar spine. MRI lumbar spine: Disc protrusions at L4-5 and L5-S1 without focal neural impingement. MRI c-spine: moderate right foraminal stenosis at C5-C6 and moderate left foraminal stenosis at C3-C4 due to spurring and facet disease, potentially causing C4, C6 radicular symptoms. Pain this morning is the same as on admission. Pt received either Dilaudid or Roxicodone ~every 2 hours overnight. - Cannot get thoracic MRI until 11/12 because she has to wait 48 hours before she can get more contrast. - Continue home Baclofen 10mg  BID PRN - Dilaudid 0.5mg  q 4 PRN (severe pain)  - Roxicodone 5mg  q 4 PRN (moderate pain)  - Toradol 20mg  daily  - Hold home tramadol - Cont home Gabapentin  - PT consult   Changes in urine: Notes of urinary urgency and dark urine. Afebrile overnight with WBC 7.6. - Will f/u with UA today. Has not yet been collected.  Chronic Hepatitis C: Near end of harvoni course, most recent HCV RNA quant 08/16/15 was undetectable. - Cont home Ledipasvir-Sofosbuvir  HTN: BPs ranging from 113-165/79-93. BPs may be elevated secondary to pain. - Cont Amlodipine - Cont Maxzide   GERD: Stable -  Protonix   Depression: stable - Fluoxetine  FEN/GI: heart diet Prophylaxis: Lovenox  Disposition:Pending PT evaluation.  Subjective:  She states her back pain is the same as when she was admitted. She states she is having episodes of pain. She also notes pain down the back of her right leg to the bottom of her foot. The pain medications are helping.   Objective: Temp:  [98.4 F (36.9 C)-98.5 F (36.9 C)] 98.5 F (36.9 C) (11/09 2111) Pulse Rate:  [54-69] 63 (11/09 2111) Resp:  [16-18] 18 (11/09 2111) BP: (113-165)/(64-96) 150/85 mmHg (11/09 2111) SpO2:  [95 %-100 %] 96 % (11/09 2111) Weight:  [180 lb (81.647 kg)-188 lb 12.8 oz (85.639 kg)] 188 lb 12.8 oz (85.639 kg) (11/09 2111) Physical Exam: General: NAD HEENT: Atraumatic, normocephalic, neck supple, EOMI, sclera clear  CV: RRR, no murmurs, rubs, or gallops PULM: CTAB, normal effort ABD: Soft, nontender, nondistended, +BS, no organomegaly SKIN: No rash or cyanosis; warm and well-perfused EXTR: No lower extremity edema or calf tenderness MSK: Spinal processes in thoracic spine are exquisitely tender to palpation. Paraspinal muscles are tender throughout entire spine. PSYCH: Mood and affect euthymic, normal rate and volume of speech NEURO: Awake, alert, no focal deficits grossly, normal speech; decreased sensation in right lower extremity compared to left.  Laboratory:  Recent Labs Lab 09/19/15 1452  WBC 7.6  HGB 12.3  HCT 36.9  PLT 129*    Recent Labs Lab 09/19/15 1452 09/20/15 0409  NA 138 136  K 3.5 3.7  CL 104 102  CO2 25 27  BUN 10 15  CREATININE 0.66 0.94  CALCIUM 9.4 9.4  GLUCOSE 105* 97    Imaging/Diagnostic Tests: X-ray lumbar spine (11/9): Endplate spurring at 075-GRM and L5-S1. No acute osseous abnormality. X-ray pelvis (11/9): No significant abnormalities of the pelvis. Degenerative disc disease in the lower lumbar spine. MRI lumbar spine (11/9): Disc protrusion at L4-5 and L5-S1 without focal  neural impingement. Gallbladder is quite distended. MRI c-spine (11/9): Moderate right foraminal stenosis at C5-6 due to uncovertebral spurring and facet disease, potentially resulting in symptomatic right C6 radicular symptoms. Moderate left foraminal narrowing at C3-4 due to uncovertebral spurring and facet disease, potentially resulting in left C4 radicular symptoms. Findings are relatively stable as compared to previous MRI.   [name], MD 09/20/2015, 6:55 AM PGY-1, [place] Intern pager: [phone], text pages welcome

## 2015-09-20 NOTE — Care Management Note (Signed)
Case Management Note  Patient Details  Name: IYLA BALZARINI MRN: [id number] Date of Birth: [birth year]  Subjective/Objective:                    Action/Plan: Met with patient to discuss discharge planning. Patient is agreeable to [facility] and has chosen [facility].  Miranda with [facility] was notified and has accepted the referral. [facility] was notified of need for equipment prior to discharge home.  Expected Discharge Date:                  Expected Discharge Plan:  [place]  In-House Referral:     Discharge planning Services  CM Consult  Post Acute Care Choice:  Durable Medical Equipment, Home Health Choice offered to:     DME Arranged:  [facility] Agency:  [place] Arranged:  [facility] Agency:  [place]  Status of Service:  Completed, signed off  Medicare Important Message Given:    Date Medicare IM Given:    Medicare IM give by:    Date Additional Medicare IM Given:    Additional Medicare Important Message give by:     If discussed at [place] of Stay Meetings, dates discussed:    Additional Comments:  Rolm Baptise, RN 09/20/2015, 3:25 PM

## 2015-09-21 DIAGNOSIS — M5416 Radiculopathy, lumbar region: Secondary | ICD-10-CM | POA: Diagnosis not present

## 2015-09-21 DIAGNOSIS — Z66 Do not resuscitate: Secondary | ICD-10-CM | POA: Diagnosis not present

## 2015-09-21 DIAGNOSIS — I10 Essential (primary) hypertension: Secondary | ICD-10-CM | POA: Diagnosis not present

## 2015-09-21 DIAGNOSIS — F329 Major depressive disorder, single episode, unspecified: Secondary | ICD-10-CM | POA: Diagnosis not present

## 2015-09-21 DIAGNOSIS — M549 Dorsalgia, unspecified: Secondary | ICD-10-CM | POA: Diagnosis not present

## 2015-09-21 DIAGNOSIS — K219 Gastro-esophageal reflux disease without esophagitis: Secondary | ICD-10-CM | POA: Diagnosis not present

## 2015-09-21 DIAGNOSIS — M546 Pain in thoracic spine: Secondary | ICD-10-CM | POA: Diagnosis not present

## 2015-09-21 DIAGNOSIS — R3915 Urgency of urination: Secondary | ICD-10-CM | POA: Diagnosis not present

## 2015-09-21 DIAGNOSIS — M5010 Cervical disc disorder with radiculopathy, unspecified cervical region: Secondary | ICD-10-CM | POA: Diagnosis not present

## 2015-09-21 DIAGNOSIS — R531 Weakness: Secondary | ICD-10-CM | POA: Diagnosis not present

## 2015-09-21 DIAGNOSIS — B182 Chronic viral hepatitis C: Secondary | ICD-10-CM | POA: Diagnosis not present

## 2015-09-21 DIAGNOSIS — M5412 Radiculopathy, cervical region: Secondary | ICD-10-CM | POA: Diagnosis not present

## 2015-09-21 LAB — COMPREHENSIVE METABOLIC PANEL
ALBUMIN: 3.3 g/dL — AB (ref 3.5–5.0)
ALK PHOS: 99 U/L (ref 38–126)
ALT: 57 U/L — AB (ref 14–54)
AST: 55 U/L — AB (ref 15–41)
Anion gap: 5 (ref 5–15)
BILIRUBIN TOTAL: 0.6 mg/dL (ref 0.3–1.2)
BUN: 23 mg/dL — AB (ref 6–20)
CALCIUM: 9 mg/dL (ref 8.9–10.3)
CO2: 25 mmol/L (ref 22–32)
CREATININE: 1.02 mg/dL — AB (ref 0.44–1.00)
Chloride: 107 mmol/L (ref 101–111)
GFR calc Af Amer: >60 mL/min (ref >60)
GFR calc non Af Amer: 57 mL/min — ABNORMAL LOW (ref >60)
GLUCOSE: 93 mg/dL (ref 65–99)
Potassium: 3.8 mmol/L (ref 3.5–5.1)
Sodium: 137 mmol/L (ref 135–145)
TOTAL PROTEIN: 6.3 g/dL — AB (ref 6.5–8.1)

## 2015-09-21 LAB — RHEUMATOID FACTOR: Rhuematoid fact SerPl-aCnc: 22.1 IU/mL — ABNORMAL HIGH (ref 0.0–13.9)

## 2015-09-21 MED ORDER — FLUOXETINE HCL 20 MG PO CAPS
60.0000 mg | ORAL_CAPSULE | Freq: Every day | ORAL | Status: DC
  Administered 2015-09-21: 60 mg via ORAL
  Filled 2015-09-21: qty 3

## 2015-09-21 MED ORDER — TRAMADOL HCL 50 MG PO TABS: 50.0000 mg | ORAL_TABLET | Freq: Four times a day (QID) | ORAL | Status: DC | PRN

## 2015-09-21 MED ORDER — GABAPENTIN 300 MG PO CAPS: 600.0000 mg | ORAL_CAPSULE | Freq: Three times a day (TID) | ORAL | Status: DC

## 2015-09-21 MED ORDER — OXYCODONE HCL 5 MG PO TABS: 5.0000 mg | ORAL_TABLET | ORAL | Status: DC | PRN

## 2015-09-21 MED ORDER — TRAMADOL HCL 50 MG PO TABS: 100.0000 mg | ORAL_TABLET | Freq: Four times a day (QID) | ORAL | Status: DC | PRN

## 2015-09-21 MED ORDER — OXYCODONE HCL 5 MG PO TABS
5.0000 mg | ORAL_TABLET | ORAL | Status: DC | PRN
Start: 2015-09-21 — End: 2015-10-10

## 2015-09-21 NOTE — Progress Notes (Signed)
Family Medicine Teaching Service Daily Progress Note Intern Pager: [phone]  Patient name: Latoya Swanson Medical record number: [id number] Date of birth: 1951-07-07 Age: 64 y.o. Gender: female  Primary Care Provider: [name], MD Consultants: None Code Status: DNR  Pt Overview and Major Events to Date:  11/9: Admitted to [facility] with intractable back pain.  Assessment and Plan: Latoya Swanson is a 64 y.o. female presenting with intractable back pain. PMH is significant for chronic lumbar disk disease, Chronic Hep C, HTN, GERD, Depression.  Back pain with right sided sciatica: pain with radiation to groin. No red flags. Lumbar spine X-ray: no acute osseous abnormality; endplate spurring at 075-GRM and L5-S1. X-ray Pelvis: No significant abnormalities of pelvis DD in lower lumbar spine. MRI lumbar spine: Disc protrusions at L4-5 and L5-S1 without focal neural impingement. MRI c-spine: moderate right foraminal stenosis at C5-C6 and moderate left foraminal stenosis at C3-C4 due to spurring and facet disease, potentially causing C4, C6 radicular symptoms. CT T-spine: No significant abnormalities. Pain this morning is slightly improved. Pt received either Dilaudid/Roxicodone ~every 4 hours overnight. - Will transition Pt to Baclofen, Gabapentin 600mg  tid, and home Tramadol dose today. If her pain is fairly well controlled, will discharge her this afternoon. - PT recommending HHPT, 24 hour supervision, rolling walker with 5" wheels. - MRI showing distended gallbladder. PCP would like [place] to check CMET this am to make sure her liver enzymes are not elevated.  Changes in urine: Notes of urinary urgency and dark urine. Afebrile overnight with WBC 7.6. UA: rare bacteria, negative Hgb, negative leukocytes, negative nitrites, 0-2 WBC. - Will continue to monitor.  Chronic Hepatitis C: Near end of harvoni course, most recent HCV RNA quant 08/16/15 was undetectable. - Cont home Ledipasvir-Sofosbuvir  HTN:  BPs ranging from 105-140/62-78. BPs may be elevated secondary to pain. - Cont Amlodipine - Cont Maxzide   GERD: Stable - Protonix   Depression: stable - Fluoxetine  FEN/GI: heart diet, SLIV Prophylaxis: Lovenox  Disposition: Discharge likely today with HHPT and 5" rolling walker.  Subjective:  Doing well this morning. Still having back pain. No other concerns.  Objective: Temp:  [97.8 F (36.6 C)-98.4 F (36.9 C)] 98.4 F (36.9 C) (11/11 0606) Pulse Rate:  [62-70] 63 (11/11 0606) Resp:  [16-18] 18 (11/11 0606) BP: (105-140)/(62-83) 116/65 mmHg (11/11 0606) SpO2:  [97 %-99 %] 97 % (11/11 0606) Physical Exam: General: NAD HEENT: Atraumatic, normocephalic, neck supple, EOMI, sclera clear  CV: RRR, no murmurs, rubs, or gallops PULM: CTAB, normal effort ABD: Soft, mildly generalized tenderness, +BS SKIN: No rash or cyanosis; warm and well-perfused EXTR: No lower extremity edema or calf tenderness PSYCH: Mood and affect euthymic, normal rate and volume of speech NEURO: Awake, alert, no focal deficits grossly, normal speech; decreased sensation in right lower extremity compared to left.  Laboratory:  Recent Labs Lab 09/19/15 1452  WBC 7.6  HGB 12.3  HCT 36.9  PLT 129*    Recent Labs Lab 09/19/15 1452 09/20/15 0409  NA 138 136  K 3.5 3.7  CL 104 102  CO2 25 27  BUN 10 15  CREATININE 0.66 0.94  CALCIUM 9.4 9.4  GLUCOSE 105* 97    Imaging/Diagnostic Tests: X-ray lumbar spine (11/9): Endplate spurring at 075-GRM and L5-S1. No acute osseous abnormality. X-ray pelvis (11/9): No significant abnormalities of the pelvis. Degenerative disc disease in the lower lumbar spine. MRI lumbar spine (11/9): Disc protrusion at L4-5 and L5-S1 without focal neural impingement. Gallbladder is  quite distended. MRI c-spine (11/9): Moderate right foraminal stenosis at C5-6 due to uncovertebral spurring and facet disease, potentially resulting in symptomatic right C6 radicular  symptoms. Moderate left foraminal narrowing at C3-4 due to uncovertebral spurring and facet disease, potentially resulting in left C4 radicular symptoms. Findings are relatively stable as compared to previous MRI. CT thoracic spine (11/10): No acute or significant abnormalities involving the thoracic spine.   Latoya Hua, MD 09/21/2015, 8:20 AM PGY-1, [place] Intern pager: [phone], text pages welcome

## 2015-09-21 NOTE — Care Management Obs Status (Signed)
[facility] NOTIFICATION   Patient Details  Name: Latoya Swanson MRN: [id number] Date of Birth: Oct 30, 1951   Medicare Observation Status Notification Given:  Yes    Rolm Baptise, RN 09/21/2015, 3:06 PM

## 2015-09-21 NOTE — Progress Notes (Signed)
PCP Visit  Appreciate the care of our inpt team  This AM she is feeling a little better.  I did not get her up to try to walk  Imaging studies reviewed  I do not think I would proceed with more diagnostic work up in the hospital unless she is unable to leave due to pain. I will follow as an otpt and do more imaging or inflamatory work up if she does not improve I suspect she has a peripheral nerve impingement that will slowly improve.  Enlarged GB - incidental finding - would check lfts other wise will follow up as otpt   Thanks  LC

## 2015-09-21 NOTE — Discharge Summary (Signed)
[facility] Discharge Summary  Patient name: Latoya Swanson Medical record number: [id number] Date of birth: 1951-06-16 Age: 64 y.o. Gender: female Date of Admission: 09/19/2015  Date of Discharge: 09/21/15 Admitting Physician: Dickie La, MD  Primary Care Provider: [name], MD Consultants: None  Indication for Hospitalization: Intractable back pain  Discharge Diagnoses/Problem List:  Intractable back pain Weakness Lumbar radiculopathy Cervical radiculopathy  Disposition: Home with HHPT  Discharge Condition: Stable  Discharge Exam:  General: NAD HEENT: Atraumatic, normocephalic, neck supple, EOMI, sclera clear  CV: RRR, no murmurs, rubs, or gallops PULM: CTAB, normal effort ABD: Soft, mild generalized tenderness, +BS SKIN: No rash or cyanosis; warm and well-perfused MSK: Spinous processes throughout thoracic and lumbar spine are tender to palpation. Paraspinal muscles are tender to palpation throughout back. EXTR: No lower extremity edema or calf tenderness PSYCH: Mood and affect euthymic, normal rate and volume of speech NEURO: Awake, alert, no focal deficits grossly, normal speech; decreased sensation in right lower extremity compared to left.  Brief Hospital Course:  Lavida Patch is a 64 year old female who presented with back pain that was unrelieved with therapy in the ED. She had a few falls in the 2-3 days prior to admission where her leg "gave out". She was seen by Dr. Erin Hearing in clinic and was advised to go to the ED for further evaluation. In the ED, she received multiple doses of Dilaudid (5.$RemoveBefore'5mg'UsJmwDRqsZiuR$  total), Robaxin x 1, and Prednisone $RemoveBefore'60mg'PodqKBzlSaWyA$ . X-ray lumbar spine showed no acute osseous abnormality, endplate spurring at Q2-2 and L5-S1. X-ray pelvis showed no significant abnormalities. MRI lumbar spine showed disc protrusoins at L4-5 and L5-S1 without focal neural impingement. We continued her home Baclofen $RemoveBefor'10mg'OdVSfobWtrif$  bid prn and Gabapentin  $RemoveBef'300mg'bEHQZjFpRq$  tid. We also treated her with Dilaudid 0.$RemoveBeforeD'5mg'QrTzvLnersnJXt$  q4 prn, Roxicodone $RemoveBeforeD'5mg'qntTQqJnefUqQF$  q4 prn, and Toradol $RemoveBef'20mg'lLPxhBhLwY$  daily. We held her home Tramadol. We performed a MRI c-spine, which showed moderate right foraminal stenosis at C5-6, potentially causing right C6 radicular symptoms and moderate left foraminal narrowing at C3-4, potentially resulting in left C4 radicular symptoms. She had no red flags on exam, but did have some features concerning for neoplasm including fatigue, little appetite x 3 weeks, weight loss of 4-5 lbs in 3 weeks. We wanted to perform an MRI of her T-spine as well, but she needed to wait 48 hours before she could receive more contrast. We proceeded with a CT of her T-spine, which showed mild degenerative disc disease and spondylosis. We briefly spoke with Neurology, who did not think they needed to be consulted, but recommended a sed rate, CRP, and rheumatoid factor. Sed rate and CRP were normal and rheumatoid factor was elevated at 22.1. She was evaluated by PT, who recommended HHPT, 24 hour supervision, and a rolling walker with 5" wheels. On the day of discharge, she was transitioned to Gabapentin $RemoveBefor'600mg'oqfwanSCsNMr$  tid, Baclofen $RemoveBefor'20mg'eVjjQflzRAkg$  bid prn, Tramadol $RemoveBefor'50mg'bArbucKRELKe$  qd. We also gave her 12 pills of Roxicodone $RemoveBefor'5mg'TqxeJXsJKejM$  q4hrs prn for breakthrough pain.  Ms. Gerety noted some changes in her urine, including urinary urgency and dark urine. She was afebrile throughout hospitalization with a normal WBC count. UA showed rare bacteria, negative Hgb, negative leukocytes, negative nitrites, 0-2 WBC.   On Pt's MRI lumbar spine, she was noted to have a distended gallbladder. She had some generalized tenderness to palpation of her abdomen, but no localizing RUQ pain. We checked LFTs: Alk phos 99, AST 55, ALT 57, T bili 0.6. Please continue to follow this as  an outpatient.   All of her other chronic medical conditions were stable and no changes were made to her medications.   Issues for Follow Up:  1. Pt was set up to receive HHPT and  rolling walker with 5" wheels. Please make sure she is receiving these services. 2. Pt was noted to have a distended gallbladder on MRI lumbar spine. She did not have any significant RUQ pain on exam. AST and ALT were mildly elevated. Please follow-up with this to make sure she is not having new, severe RUQ abdominal pain. 3. We discharged Ms. Kall on the following pain regimen: Baclofen 34m bid prn, Tramadol 549mqd, Gabapentin 6002mid, Roxicodone 5mg44mhrs prn. 4. Pt requested some back exercises to try at home on days when HHPT is not with her. I gave her some back exercises on her patient instructions. Please follow-up to see if these have been helpful.  Significant Procedures: None  Significant Labs and Imaging:   Recent Labs Lab 09/19/15 1452  WBC 7.6  HGB 12.3  HCT 36.9  PLT 129*    Recent Labs Lab 09/19/15 1452 09/20/15 0409 09/21/15 1030  NA 138 136 137  K 3.5 3.7 3.8  CL 104 102 107  CO2 _0 GLUCOSE 105* 97 93  BUN 10 15 23*  CREATININE 0.66 0.94 1.02*  CALCIUM 9.4 9.4 9.0  ALKPHOS  --   --  99  AST  --   --  55*  ALT  --   --  57*  ALBUMIN  --   --  3.3*   - UA: negative Hgb, negative leukocytes, negative nitrites, 0-2 WBC. - Sed rate: 13 (nl) - CRP < 0.5 (nl) - Rheumatoid factor: 22.1 (high)  - X-ray lumbar spine (11/9): Endplate spurring at L4-LC1-Y6 L5-S1. No acute osseous abnormality. - X-ray pelvis (11/9): No significant abnormalities of the pelvis. Degenerative disc disease in the lower lumbar spine. - MRI lumbar spine (11/9): Disc protrusion at L4-5 and L5-S1 without focal neural impingement. Gallbladder is quite distended. - MRI c-spine (11/9): Moderate right foraminal stenosis at C5-6 due to uncovertebral spurring and facet disease, potentially resulting in symptomatic right C6 radicular symptoms. Moderate left foraminal narrowing at C3-4 due to uncovertebral spurring and facet disease, potentially resulting in left C4 radicular symptoms.  Findings are relatively stable as compared to previous MRI. - CT thoracic spine (11/10): No acute or significant abnormalities involving the thoracic spine.  Results/Tests Pending at Time of Discharge: None  Discharge Medications:    Medication List    TAKE these medications        amLODipine 10 MG tablet  Commonly known as:  NORVASC  TAKE 1 TABLET BY MOUTH DAILY     aspirin 81 MG tablet  Take 81 mg by mouth daily.     baclofen 10 MG tablet  Commonly known as:  LIORESAL  TAKE 1 TABLET (10 MG TOTAL) BY MOUTH 2 (TWO) TIMES DAILY AS NEEDED FOR MUSCLE SPASMS.     clotrimazole 1 % cream  Commonly known as:  LOTRIMIN  Apply 1 application topically 2 (two) times daily.     diclofenac 75 MG EC tablet  Commonly known as:  VOLTAREN  TAKE 1 TABLET (75 MG TOTAL) BY MOUTH 2 (TWO) TIMES DAILY AS NEEDED.     FLUoxetine 20 MG capsule  Commonly known as:  PROZAC  TAKE 3 CAPSULES BY MOUTH EVERY DAY     gabapentin 300 MG capsule  Commonly  known as:  NEURONTIN  Take 2 capsules (600 mg total) by mouth 3 (three) times daily.     hydrocortisone 2.5 % cream  Apply topically 2 (two) times daily.     Ledipasvir-Sofosbuvir 90-400 MG Tabs  Commonly known as:  HARVONI  Take 1 tablet by mouth daily.     oxyCODONE 5 MG immediate release tablet  Commonly known as:  ROXICODONE  Take 1 tablet (5 mg total) by mouth every 4 (four) hours as needed for severe pain.     traMADol 50 MG tablet  Commonly known as:  ULTRAM  TAKE 1 TABLET BY MOUTH EVERY 6 HOURS AS NEEDED FOR PAIN     triamterene-hydrochlorothiazide 37.5-25 MG tablet  Commonly known as:  MAXZIDE-25  TAKE 1 TABLET BY MOUTH DAILY.     Vitamin B-12 500 MCG Subl  Place 500 mcg under the tongue daily.     Vitamin D-3 1000 UNITS Caps  Take 1 capsule (1,000 Units total) by mouth daily.        Discharge Instructions: Please refer to Patient Instructions section of EMR for full details.  Patient was counseled important signs and symptoms  that should prompt return to medical care, changes in medications, dietary instructions, activity restrictions, and follow up appointments.   Follow-Up Appointments:     Follow-up Information    Follow up with Chrisandra Netters, MD On 09/24/2015.   Specialty:  Family Medicine   Why:  hospital follow-up visit at 1:30pm   Contact information:   [place] [address] [place], MD 09/21/2015, 10:34 PM PGY-1, [place]

## 2015-09-21 NOTE — Discharge Instructions (Signed)
You were hospitalized for back pain. We did a lot of different imaging of your spine and it showed some arthritis. We spoke with your primary care provider, Dr. Erin Hearing, who recommended physical therapy and resuming your pain medications. I have given you a few pills of Roxicodone to help get you through the weekend until you can be seen at your follow-up appointment. I have also increased your Gabapentin from 300mg  three times a day to 600mg  three times a day. If you find this dose makes you sleepy, you can take 300mg  in the morning and afternoon, and then take 600mg  at night before bed. Please continue to take the Baclofen and Tramadol as prescribed.  As you requested, I have attached some back exercises below that you can do until you are seen by the home health physical therapist. Please try doing these 3 times per day.  Back Exercises The following exercises strengthen the muscles that help to support the back. They also help to keep the lower back flexible. Doing these exercises can help to prevent back pain or lessen existing pain. If you have back pain or discomfort, try doing these exercises 2-3 times each day or as told by your health care provider. When the pain goes away, do them once each day, but increase the number of times that you repeat the steps for each exercise (do more repetitions). If you do not have back pain or discomfort, do these exercises once each day or as told by your health care provider. EXERCISES Single Knee to Chest Repeat these steps 3-5 times for each leg: 1. Lie on your back on a firm bed or the floor with your legs extended. 2. Bring one knee to your chest. Your other leg should stay extended and in contact with the floor. 3. Hold your knee in place by grabbing your knee or thigh. 4. Pull on your knee until you feel a gentle stretch in your lower back. 5. Hold the stretch for 10-30 seconds. 6. Slowly release and straighten your leg. Pelvic Tilt Repeat these  steps 5-10 times: 1. Lie on your back on a firm bed or the floor with your legs extended. 2. Bend your knees so they are pointing toward the ceiling and your feet are flat on the floor. 3. Tighten your lower abdominal muscles to press your lower back against the floor. This motion will tilt your pelvis so your tailbone points up toward the ceiling instead of pointing to your feet or the floor. 4. With gentle tension and even breathing, hold this position for 5-10 seconds. Cat-Cow Repeat these steps until your lower back becomes more flexible: 1. Get into a hands-and-knees position on a firm surface. Keep your hands under your shoulders, and keep your knees under your hips. You may place padding under your knees for comfort. 2. Let your head hang down, and point your tailbone toward the floor so your lower back becomes rounded like the back of a cat. 3. Hold this position for 5 seconds. 4. Slowly lift your head and point your tailbone up toward the ceiling so your back forms a sagging arch like the back of a cow. 5. Hold this position for 5 seconds. SEEK MEDICAL CARE IF:  Your back pain or discomfort gets much worse when you do an exercise.  Your back pain or discomfort does not lessen within 2 hours after you exercise. If you have any of these problems, stop doing these exercises right away. Do not do them  again unless your health care provider says that you can. SEEK IMMEDIATE MEDICAL CARE IF:  You develop sudden, severe back pain. If this happens, stop doing the exercises right away. Do not do them again unless your health care provider says that you can.   This information is not intended to replace advice given to you by your health care provider. Make sure you discuss any questions you have with your health care provider.   Document Released: 12/04/2004 Document Revised: 07/18/2015 Document Reviewed: 12/21/2014 Elsevier Interactive Patient Education [employer].

## 2015-09-24 ENCOUNTER — Encounter: Payer: Self-pay | Admitting: Family Medicine

## 2015-09-24 ENCOUNTER — Ambulatory Visit (INDEPENDENT_AMBULATORY_CARE_PROVIDER_SITE_OTHER): Payer: Commercial Managed Care - HMO | Admitting: Family Medicine

## 2015-09-24 VITALS — BP 150/90 | HR 70 | Temp 98.3°F

## 2015-09-24 DIAGNOSIS — M549 Dorsalgia, unspecified: Secondary | ICD-10-CM | POA: Diagnosis not present

## 2015-09-24 DIAGNOSIS — M546 Pain in thoracic spine: Secondary | ICD-10-CM

## 2015-09-24 NOTE — Patient Instructions (Signed)
I am referring you to a spine specialist for your back. You will get a phone call to schedule this appointment.   Follow up with Dr. Erin Hearing for your blood pressure in the next month or so  Be well, Dr. Ardelia Mems

## 2015-09-24 NOTE — Progress Notes (Signed)
Patient ID: Latoya Swanson, female   DOB: 25-Dec-1950, 64 y.o.   MRN: [id number]  HPI:  Latoya Swanson presents for hospital follow up. Patient was hospitalized from 11/9 to 09/21/15 with intractable back pain. Underwent MRI lumbar and cervical spines, and CT of thoracic spine. All were unrevealing for malignant or systemic cause of her pain - did show degenerative changes. Discharged on medication regimen of baclofen 10mg  twice daily, tramadol 50mg  daily, gabapentin 600mg  three times daily, and oxycodone 5mg  q4h as needed.   Pt now reports her pain is a little better than before, but still present. She requests referral to spine specialist. Only taking gabapentin at night. Not taking oxycodone at all as it is not helping the pain whatsoever. Did not get set up with home health, states she does not want them to come to her house. Declines outpatient physical therapy referral, states she is familiar with the exercises she should do. Does have walker and cane at home. During hospitalization had reported some RUQ pain, states this is resolved.   Denies having fever, saddle anesthesia, lower extremity weakness, or problems with stooling or urination.   ROS: See HPI.  [place]: neck pain, hepatitis C, depression, hypertension, GERD, hyperlipidemia,   PHYSICAL EXAM: BP 150/90 mmHg  Pulse 70  Temp(Src) 98.3 F (36.8 C) (Oral)  Wt   LMP 12/01/2001 Gen: NAD, pleasant, cooperative HEENT: NCAT Ext: full strength bilateral lower extremities. Sensation intact over bilateral lower extremities. 2+ patellar reflexes bilaterally  Neuro: gait normal, slightly antalgic  ASSESSMENT/PLAN:  Intractable back pain Somewhat improved, but still in significant pain. Requesting referral to spine specialist. No red flags. Referral entered. Continue current medication regimen.    FOLLOW UP: F/u in next month with PCP for hypertension.   [place]. Ardelia Mems, [place]

## 2015-09-24 NOTE — Assessment & Plan Note (Addendum)
Somewhat improved, but still in significant pain. Requesting referral to spine specialist. No red flags. Referral entered. Continue current medication regimen.

## 2015-09-25 ENCOUNTER — Telehealth: Payer: Self-pay | Admitting: Family Medicine

## 2015-09-25 NOTE — Telephone Encounter (Signed)
Will forward to MD to place a DME order for diabetic shoes. [name],CMA

## 2015-09-25 NOTE — Telephone Encounter (Signed)
Pls let her know I need to examine her feet and will do so at her next visit  Thanks  [place]

## 2015-09-25 NOTE — Telephone Encounter (Signed)
Pt called because she needs a referral to have diabetic shoes. They also need the reason. jw

## 2015-09-26 NOTE — Telephone Encounter (Signed)
Spoke with patient and made an appt for ed f/u and to discuss a "post-op" shoe. Tamu Golz,CMA

## 2015-10-03 ENCOUNTER — Inpatient Hospital Stay: Payer: Commercial Managed Care - HMO | Admitting: Family Medicine

## 2015-10-08 ENCOUNTER — Other Ambulatory Visit: Payer: Self-pay | Admitting: Family Medicine

## 2015-10-10 ENCOUNTER — Encounter: Payer: Self-pay | Admitting: Family Medicine

## 2015-10-10 ENCOUNTER — Ambulatory Visit (INDEPENDENT_AMBULATORY_CARE_PROVIDER_SITE_OTHER): Payer: Commercial Managed Care - HMO | Admitting: Family Medicine

## 2015-10-10 VITALS — BP 126/68 | HR 62 | Temp 98.0°F | Ht 65.0 in | Wt 182.0 lb

## 2015-10-10 DIAGNOSIS — M25551 Pain in right hip: Secondary | ICD-10-CM | POA: Diagnosis not present

## 2015-10-10 DIAGNOSIS — I10 Essential (primary) hypertension: Secondary | ICD-10-CM | POA: Diagnosis not present

## 2015-10-10 DIAGNOSIS — Z23 Encounter for immunization: Secondary | ICD-10-CM

## 2015-10-10 MED ORDER — OXYCODONE HCL 5 MG PO TABS: 5.0000 mg | ORAL_TABLET | ORAL | Status: DC | PRN

## 2015-10-10 MED ORDER — ZOSTER VACCINE LIVE 19400 UNT/0.65ML ~~LOC~~ SOLR: 0.6500 mL | Freq: Once | SUBCUTANEOUS | Status: DC

## 2015-10-10 NOTE — Assessment & Plan Note (Signed)
Stable continue current medications

## 2015-10-10 NOTE — Progress Notes (Signed)
   Subjective:    Patient ID: Latoya Swanson, female    DOB: 1951-06-13, 64 y.o.   MRN: [id number]  HPI  Back Neck Pain Some better.  Able to move with less pain.  Using gabapentin 2-3 times a day 1-2 tabs but does make her sleepy.  Uses an oxycodone less than one per day.   Has appointment with spine surgeon.  No weakness or incontinence  HYPERTENSION Disease Monitoring Home BP Monitoring (Severity) not checking Symptoms - Chest pain- no    Dyspnea- no Medications(Modifying factors) Compliance-  daily. Lightheadedness-  no  Edema- no Timing - continuous  Duration - years ROS - See HPI  PMH Lab Review   POTASSIUM  Date Value Ref Range Status  09/21/2015 3.8 3.5 - 5.1 mmol/L Final   SODIUM  Date Value Ref Range Status  09/21/2015 137 135 - 145 mmol/L Final   CREAT  Date Value Ref Range Status  06/15/2015 0.51 0.50 - 0.99 mg/dL Final   CREATININE, SER  Date Value Ref Range Status  09/21/2015 1.02* 0.44 - 1.00 mg/dL Final       Chief Complaint noted Review of Symptoms - see HPI PMH - Smoking status noted.   Vital Signs reviewed      Review of Systems     Objective:   Physical Exam  Alert nad  Able to stand and walk without assist but does limp on right side Limited neck range of motion due to pain Able to stand on tip toes and heels with balance assist      Assessment & Plan:

## 2015-10-10 NOTE — Assessment & Plan Note (Signed)
Improved with better movement and pain control.  Has appointment with spine surgeon

## 2015-10-11 ENCOUNTER — Ambulatory Visit (INDEPENDENT_AMBULATORY_CARE_PROVIDER_SITE_OTHER): Payer: Commercial Managed Care - HMO | Admitting: Internal Medicine

## 2015-10-11 ENCOUNTER — Encounter: Payer: Self-pay | Admitting: Internal Medicine

## 2015-10-11 VITALS — BP 150/85 | HR 67 | Temp 98.1°F | Wt 185.0 lb

## 2015-10-11 DIAGNOSIS — B182 Chronic viral hepatitis C: Secondary | ICD-10-CM | POA: Diagnosis not present

## 2015-10-11 DIAGNOSIS — K7460 Unspecified cirrhosis of liver: Secondary | ICD-10-CM | POA: Diagnosis not present

## 2015-10-11 NOTE — Assessment & Plan Note (Signed)
Will need referral to GI via her PCP due to her insurance for EGD varices screening.

## 2015-10-11 NOTE — Progress Notes (Signed)
   Subjective:    Patient ID: Latoya Swanson, female    DOB: [birth year], 64 y.o.   MRN: [id number]  HPI Here for follow up of HCV.    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  Taking Harvoni and now about finished.  No issues and very pleased with results.  Has cirrhosis noted on previous MRIs.  Has not been to GI.  No alcohol.  No headache, no fatigue.  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     Review of Systems  Constitutional: Negative for fatigue.  Gastrointestinal: Negative for nausea and diarrhea.  Skin: Negative for rash.  Neurological: Negative for light-headedness and headaches.       Objective:   Physical Exam  Constitutional: She appears well-developed and well-nourished. No distress.  Eyes: No scleral icterus.  Cardiovascular: Normal rate, regular rhythm and normal heart sounds.   No murmur heard. Pulmonary/Chest: Effort normal and breath sounds normal. No respiratory distress.  Lymphadenopathy:    She has no cervical adenopathy.  Skin: No rash noted.  Vitals reviewed.  Social History   Social History  . Marital Status: Single    Spouse Name: Claudie Fisherman   . Number of Children: N/A  . Years of Education: 15   Occupational History  . DISABLED    Social History Main Topics  . Smoking status: Light Tobacco Smoker    Types: Cigarettes  . Smokeless tobacco: Never Used     Comment: 2-3 cigs a day  . Alcohol Use: No  . Drug Use: No  . Sexual Activity: Yes    Birth Control/ Protection: Post-menopausal   Other Topics Concern  . Not on file   Social History Narrative   Lives with her husband and her Chow dog.          Assessment & Plan:

## 2015-10-11 NOTE — Assessment & Plan Note (Signed)
Doing well on medicaiton and undetectable early. Will recheck after treatment and she can return in 4 months for SVR12 to confirm cure.

## 2015-10-15 DIAGNOSIS — M5126 Other intervertebral disc displacement, lumbar region: Secondary | ICD-10-CM | POA: Diagnosis not present

## 2015-10-15 DIAGNOSIS — I10 Essential (primary) hypertension: Secondary | ICD-10-CM | POA: Diagnosis not present

## 2015-10-15 DIAGNOSIS — M542 Cervicalgia: Secondary | ICD-10-CM | POA: Diagnosis not present

## 2015-10-15 DIAGNOSIS — Z6831 Body mass index (BMI) 31.0-31.9, adult: Secondary | ICD-10-CM | POA: Diagnosis not present

## 2015-10-17 ENCOUNTER — Ambulatory Visit (INDEPENDENT_AMBULATORY_CARE_PROVIDER_SITE_OTHER): Payer: Commercial Managed Care - HMO | Admitting: Podiatry

## 2015-10-17 VITALS — BP 150/84 | HR 72 | Resp 16 | Ht 65.0 in | Wt 182.0 lb

## 2015-10-17 DIAGNOSIS — L600 Ingrowing nail: Secondary | ICD-10-CM

## 2015-10-17 NOTE — Patient Instructions (Signed)
ANTIBACTERIAL SOAP INSTRUCTIONS  THE DAY AFTER PROCEDURE  Please follow the instructions your doctor has marked.   Shower as usual. Before getting out, place a drop of antibacterial liquid soap (Dial) on a wet, clean washcloth.  Gently wipe washcloth over affected area.  Afterward, rinse the area with warm water.  Blot the area dry with a soft cloth and cover with antibiotic ointment (neosporin, polysporin, bacitracin) and band aid or gauze and tape  Place 3-4 drops of antibacterial liquid soap in a quart of warm tap water.  Submerge foot into water for 20 minutes.  If bandage was applied after your procedure, leave on to allow for easy lift off, then remove and continue with soak for the remaining time.  Next, blot area dry with a soft cloth and cover with a bandage.  Apply other medications as directed by your doctor, such as cortisporin otic solution (eardrops) or neosporin antibiotic ointment   [facility] Instructions-Post Nail Surgery  You have had your ingrown toenail and root treated with a chemical.  This chemical causes a burn that will drain and ooze like a blister.  This can drain for 6-8 weeks or longer.  It is important to keep this area clean, covered, and follow the soaking instructions dispensed at the time of your surgery.  This area will eventually dry and form a scab.  Once the scab forms you no longer need to soak or apply a dressing.  If at any time you experience an increase in pain, redness, swelling, or drainage, you should contact the office as soon as possible. 

## 2015-10-17 NOTE — Progress Notes (Signed)
   Subjective:    Patient ID: Latoya Swanson, female    DOB: 07/24/1951, 64 y.o.   MRN: [id number]  HPI Patient presents with a nail problem on their left foot; great toe; both sides. Pt stated, "saw pus 2-3 weeks ago"; pt soaked in epsom salt with some relief; x3 mo.   Review of Systems  Constitutional: Positive for fatigue.  Eyes: Positive for visual disturbance.  Neurological: Positive for weakness and numbness.  All other systems reviewed and are negative.      Objective:   Physical Exam        Assessment & Plan:

## 2015-10-18 ENCOUNTER — Other Ambulatory Visit: Payer: Commercial Managed Care - HMO

## 2015-10-18 ENCOUNTER — Other Ambulatory Visit: Payer: Self-pay | Admitting: Family Medicine

## 2015-10-18 NOTE — Telephone Encounter (Signed)
Pls call in Tramadol  Thanks  LC

## 2015-10-18 NOTE — Progress Notes (Signed)
Subjective:     Patient ID: Latoya Swanson, female   DOB: 1951-10-05, 64 y.o.   MRN: [id number]  HPI patient presents with painful left hallux medial lateral borders that she tries to trim herself, and has been unable to due to the discomfort. Has noted drainage which is not present now but does have redness   Review of Systems  All other systems reviewed and are negative.      Objective:   Physical Exam  Constitutional: She is oriented to person, place, and time.  Cardiovascular: Intact distal pulses.   Musculoskeletal: Normal range of motion.  Neurological: She is oriented to person, place, and time.  Skin: Skin is warm.  Nursing note and vitals reviewed.  neurovascular status intact muscle strength adequate range of motion within normal limits. Patient's noted to have an incurvated left hallux heel and lateral borders with pain on both sides with distal redness but no current drainage noted. Patient is found to have good digital perfusion and is well oriented 3     Assessment:     Ingrown toenail deformity left hallux medial lateral borders with pain    Plan:     H&P conditions reviewed with patient and discussed condition. At this point I recommended removal of the corners and explained the procedure to patient and I went ahead today and explained risk. I infiltrated the left hallux 60 mg Xylocaine Marcaine mixture remove the medial lateral borders and exposed matrix applying phenol 3 applications 30 seconds followed by alcohol lavage and sterile dressing. Gave instructions on soaks and reappoint

## 2015-10-19 ENCOUNTER — Other Ambulatory Visit: Payer: Self-pay | Admitting: Family Medicine

## 2015-10-19 NOTE — Telephone Encounter (Signed)
Medication called into pharmacy and patient is aware of this. Vi Whitesel,CMA

## 2015-10-22 ENCOUNTER — Telehealth: Payer: Self-pay | Admitting: *Deleted

## 2015-10-22 ENCOUNTER — Other Ambulatory Visit: Payer: Self-pay | Admitting: *Deleted

## 2015-10-22 MED ORDER — FLUOXETINE HCL 20 MG PO CAPS: 60.0000 mg | ORAL_CAPSULE | Freq: Every day | ORAL | Status: DC

## 2015-10-22 NOTE — Telephone Encounter (Signed)
Called patient at [phone] (Home #) to check to see how they were doing from their ingrown toenail procedure that was performed on Wednesday, October 17, 2015. Pt stated, "doing fine and toe is not oozing". Pt is soaking toe with some relief.

## 2015-10-24 ENCOUNTER — Ambulatory Visit (INDEPENDENT_AMBULATORY_CARE_PROVIDER_SITE_OTHER): Payer: Commercial Managed Care - HMO | Admitting: Family Medicine

## 2015-10-24 ENCOUNTER — Encounter: Payer: Self-pay | Admitting: Family Medicine

## 2015-10-24 VITALS — BP 146/85 | HR 74 | Temp 98.4°F | Wt 188.0 lb

## 2015-10-24 DIAGNOSIS — K7460 Unspecified cirrhosis of liver: Secondary | ICD-10-CM | POA: Diagnosis not present

## 2015-10-24 DIAGNOSIS — M25551 Pain in right hip: Secondary | ICD-10-CM

## 2015-10-24 DIAGNOSIS — R932 Abnormal findings on diagnostic imaging of liver and biliary tract: Secondary | ICD-10-CM | POA: Insufficient documentation

## 2015-10-24 MED ORDER — OXYCODONE HCL 5 MG PO TABS: 5.0000 mg | ORAL_TABLET | Freq: Four times a day (QID) | ORAL | Status: DC | PRN

## 2015-10-24 NOTE — Assessment & Plan Note (Signed)
Asymptomatic on MRI.  Will refer for GI evaluation after she has scheduled [place]

## 2015-10-24 NOTE — Assessment & Plan Note (Signed)
Worsened.  See after visit summary for medication plan.  Hopefully a sciatica or nerve impingment that will slowly resolve

## 2015-10-24 NOTE — Patient Instructions (Addendum)
1. For the back and neck pain   Gabapentin - take 2 tabs 8 AM then 2 tabs at 4 PM and then 3 tabs before bed - every day   Diclofenac - take 1 tab twice daily at 8 AM and 8 PM every day unless the pain is good  Take As Needed   Baclofen - 1 tab every 12 hours as needed    Tramadol 2 tabs every 8 hours as needed +   Oxycodone one tab up to every 8 hours as really needed   2. For the gall bladder - Get the ultrasound and I will refer you to a GI doctor  3. For Evaluation after your Hep C treatment - I will refer you to a GI doctor - they will contact you  4. You should ask the GI doctor about a colonscopy once the above problems are over  Happy Holidays

## 2015-10-24 NOTE — Progress Notes (Signed)
   Subjective:    Patient ID: Latoya Swanson, female    DOB: 08/09/1951, 64 y.o.   MRN: [id number]  HPI  Leg Pain Pain is worse in r leg than last week.  Saw NS who did not feel any operation on her back would help her leg pain but a neck operation might.  Taking all medications as needed and the narcotic once a day or so.  No weakness or incontinence. Able to ambulate but with pain  Gall Bladder Enlargement Incidental finding on MRI of back.  Neuro surgery wants this to be addressed before any surgery on her neck.  No abdomen pain or nausea vomiting or jaundice  Chief Complaint noted Review of Symptoms - see HPI PMH - Smoking status noted.   Vital Signs reviewed     Review of Systems     Objective:   Physical Exam  Alert mild distress Walks with a limp Pain with SLR on R with some pain with hip range of motion Distal plantar flexion extension is normal       Assessment & Plan:

## 2015-11-01 ENCOUNTER — Other Ambulatory Visit: Payer: Commercial Managed Care - HMO

## 2015-11-07 ENCOUNTER — Other Ambulatory Visit: Payer: Commercial Managed Care - HMO

## 2015-11-09 ENCOUNTER — Ambulatory Visit
Admission: RE | Admit: 2015-11-09 | Discharge: 2015-11-09 | Disposition: A | Discharge status: Home / Self Care | Payer: Commercial Managed Care - HMO | Source: Ambulatory Visit | Attending: Internal Medicine | Admitting: Internal Medicine

## 2015-11-09 DIAGNOSIS — K7460 Unspecified cirrhosis of liver: Secondary | ICD-10-CM | POA: Diagnosis not present

## 2015-11-22 ENCOUNTER — Telehealth: Payer: Self-pay | Admitting: Family Medicine

## 2015-11-22 NOTE — Telephone Encounter (Signed)
Will forward to MD to consider medication increase. [name],CMA

## 2015-11-22 NOTE — Telephone Encounter (Signed)
Needs to speak to someone about her pain medication.  She is in "great pain" .  Her regular pain med is not doing its job.  She needs stronger oxycodone. Oxycodone ---5 mg.  It was suppose to last 30 days. It was filled Dec 14. She has no more pills.

## 2015-11-22 NOTE — Telephone Encounter (Signed)
Left vm that I will call back in AM (we are closed and I can't call in a pain medication)

## 2015-11-23 NOTE — Telephone Encounter (Signed)
Spoke with patient having more pain lower back on R but now also on left.  Also has fallen 3 times in the last few days.  She feels her legs are weaker than before.  No incontience.  Given the legs feel weaker I advised she should be seen right away either in the ER or in an UC. She agrees.  She will also make a follow up appointment here next week  Given her prior evaluations and somewhat hysteric nature I doubt acute cord compression but I can't rule this out over the phone and we have no openings on Friday afternoon

## 2015-11-23 NOTE — Telephone Encounter (Signed)
Will forward to MD to see if patient can be reached once again. [name],CMA

## 2015-11-23 NOTE — Telephone Encounter (Signed)
Called again  Got vm.  Left message to call if needed

## 2015-11-23 NOTE — Telephone Encounter (Signed)
Pt is returning the doctor's call. jw °

## 2015-11-28 ENCOUNTER — Ambulatory Visit (INDEPENDENT_AMBULATORY_CARE_PROVIDER_SITE_OTHER): Payer: Commercial Managed Care - HMO | Admitting: Family Medicine

## 2015-11-28 ENCOUNTER — Ambulatory Visit: Payer: Commercial Managed Care - HMO | Admitting: Family Medicine

## 2015-11-28 ENCOUNTER — Encounter: Payer: Self-pay | Admitting: Family Medicine

## 2015-11-28 VITALS — BP 160/85 | HR 72 | Temp 98.6°F | Ht 65.0 in | Wt 189.9 lb

## 2015-11-28 DIAGNOSIS — R932 Abnormal findings on diagnostic imaging of liver and biliary tract: Secondary | ICD-10-CM | POA: Diagnosis not present

## 2015-11-28 DIAGNOSIS — M25551 Pain in right hip: Secondary | ICD-10-CM | POA: Diagnosis not present

## 2015-11-28 DIAGNOSIS — M5412 Radiculopathy, cervical region: Secondary | ICD-10-CM

## 2015-11-28 MED ORDER — GABAPENTIN 300 MG PO CAPS: 900.0000 mg | ORAL_CAPSULE | Freq: Three times a day (TID) | ORAL | Status: DC

## 2015-11-28 MED ORDER — OXYCODONE HCL 5 MG PO TABS: 5.0000 mg | ORAL_TABLET | Freq: Four times a day (QID) | ORAL | Status: DC | PRN

## 2015-11-28 NOTE — Assessment & Plan Note (Signed)
Follow up US did not show any abnormality.  She will discuss at her GI appointment

## 2015-11-28 NOTE — Assessment & Plan Note (Signed)
Continued pain but no significant nerve compromise.  Will continue medications and order Physical Therapy She will follow up with her neurosurgeon

## 2015-11-28 NOTE — Patient Instructions (Signed)
Good to see you today!  Thanks for coming in.  The gallbladder on the [place] is normal  Keep taking all your medications as you are but increase the gabapentin to 3 caps three times daily every day  Use the tramadol and oxycodone and diclofenac and muscle relaxer as needed   Keep active walking as much as you can  Follow up with your neurosurgeon for possible surgery  I will refer you for Physical Therapy  Use nasal saline at least 4 times a day  Come back in one month

## 2015-11-28 NOTE — Assessment & Plan Note (Signed)
Worsened.  Most consistent with nerve root impingement given recent MRIs.  Continue current medications and increase dose of gabapentin

## 2015-11-28 NOTE — Progress Notes (Signed)
   Subjective:    Patient ID: Latoya Swanson, female    DOB: 10/09/1951, 65 y.o.   MRN: [id number]    Leg Pain Continued pain in R hip and leg and lower back.  Makes walking painful she is using a walker.  No specific focal weakness just tiredness.  No bowel or bladder incontinence.  Taking all analgesics as needed has run out of oxycodone about a week ago.  Is taking increased dose of gabapentin   Gall Bladder Enlargement No abdomen pain or nausea vomiting or jaundice.  Had [place] recently   Neck Pain   Continued pain in neck primarly the R side.  Some numbness in her hands but no weakness.  Using analgesics as above.  No fever or rash    Chief Complaint noted Review of Symptoms - see HPI PMH - Smoking status noted.   Vital Signs reviewed     Review of Systems  Musculoskeletal: Positive for neck pain.       Objective:   Physical Exam   Alert mild distress Walks with a limp using her walker Pain with SLR on R with some pain with hip range of motion Distal plantar flexion extension is normal Good Range of motion of upper extremities but pain with neck range of motion Distal upper extremities strength is normal        Assessment & Plan:

## 2015-12-26 ENCOUNTER — Ambulatory Visit (INDEPENDENT_AMBULATORY_CARE_PROVIDER_SITE_OTHER): Payer: Commercial Managed Care - HMO | Admitting: Family Medicine

## 2015-12-26 ENCOUNTER — Encounter: Payer: Self-pay | Admitting: Family Medicine

## 2015-12-26 VITALS — BP 140/88 | HR 71 | Temp 98.6°F | Ht 65.0 in | Wt 185.0 lb

## 2015-12-26 DIAGNOSIS — I10 Essential (primary) hypertension: Secondary | ICD-10-CM | POA: Diagnosis not present

## 2015-12-26 DIAGNOSIS — M25551 Pain in right hip: Secondary | ICD-10-CM | POA: Diagnosis not present

## 2015-12-26 DIAGNOSIS — M5412 Radiculopathy, cervical region: Secondary | ICD-10-CM | POA: Diagnosis not present

## 2015-12-26 DIAGNOSIS — L280 Lichen simplex chronicus: Secondary | ICD-10-CM | POA: Diagnosis not present

## 2015-12-26 MED ORDER — CLOTRIMAZOLE 1 % EX CREA: 1.0000 "application " | TOPICAL_CREAM | Freq: Two times a day (BID) | CUTANEOUS | Status: DC

## 2015-12-26 MED ORDER — OXYCODONE HCL 5 MG PO TABS: 5.0000 mg | ORAL_TABLET | Freq: Four times a day (QID) | ORAL | Status: DC | PRN

## 2015-12-26 MED ORDER — HYDROCORTISONE 2.5 % EX CREA: TOPICAL_CREAM | Freq: Two times a day (BID) | CUTANEOUS | Status: DC

## 2015-12-26 NOTE — Assessment & Plan Note (Signed)
Stable.  Tried Physical Therapy but felt was too painful but she agrees to try again.  Continue current medications Seems to be using appropriately

## 2015-12-26 NOTE — Assessment & Plan Note (Addendum)
Stable on current medications Will continue.  No urgency for NS follow up

## 2015-12-26 NOTE — Progress Notes (Signed)
   Subjective:    Patient ID: Latoya Swanson, female    DOB: 01/27/1951, 65 y.o.   MRN: [id number]  HPI Leg Pain Pain in R hip and leg and lower back if somewhat better.  Using a cane instead of walker most of the time.   No specific focal weakness just tiredness.  No bowel or bladder incontinence.  Taking all analgesics as needed.  Tolerating gabapentin well.  No falls   Neck Pain  Continued pain in neck primarly the R side.  Some numbness in her hands but no weakness.  Using analgesics as above.  No fever or rash.  Will hold off follow up with NS since she has a sick friend and does not want surgery now.  HYPERTENSION Disease Monitoring Home BP Monitoring (Severity) not checking Symptoms - Chest pain- no    Dyspnea- no Medications(Modifying factors) Compliance-  Daily triamterene. Lightheadedness-  no  Edema- no Timing - continuous  Duration - years ROS - See HPI  PMH Lab Review   POTASSIUM  Date Value Ref Range Status  09/21/2015 3.8 3.5 - 5.1 mmol/L Final   SODIUM  Date Value Ref Range Status  09/21/2015 137 135 - 145 mmol/L Final   CREAT  Date Value Ref Range Status  06/15/2015 0.51 0.50 - 0.99 mg/dL Final   CREATININE, SER  Date Value Ref Range Status  09/21/2015 1.02* 0.44 - 1.00 mg/dL Final       Chief Complaint noted Review of Symptoms - see HPI PMH - Smoking status noted.   Vital Signs reviewed  Review of Systems     Objective:   Physical Exam Alert nad Able to stand and walk with cane but prounced limp on R due to pain With support is able to stand on toes and heels and do partial knee bend Distal leg strength and sensation intact     Assessment & Plan:

## 2015-12-26 NOTE — Assessment & Plan Note (Signed)
BP Readings from Last 3 Encounters:  12/26/15 140/88  11/28/15 160/85  10/24/15 146/85   Mostly at goal despite pain.  Continue current medications

## 2015-12-26 NOTE — Patient Instructions (Signed)
Good to see you today!  Thanks for coming in.  Start back with Physical Therapy  Take the pain medications as needed   If you have any weakness or severe pain call us  Follow up with GI doctor  Come back in 2-3 months

## 2016-01-01 ENCOUNTER — Ambulatory Visit: Payer: Commercial Managed Care - HMO | Admitting: Gastroenterology

## 2016-01-01 ENCOUNTER — Telehealth: Payer: Self-pay | Admitting: Gastroenterology

## 2016-01-15 ENCOUNTER — Telehealth: Payer: Self-pay | Admitting: Family Medicine

## 2016-01-15 NOTE — Telephone Encounter (Signed)
Will forward to MD to advise. Mahdi Frye,CMA  

## 2016-01-15 NOTE — Telephone Encounter (Signed)
The 2 creams that were prescribed for pt's irritation in groin area are not helping at all. Patient would like to have something else more effective sent in for her. Pls Advise.

## 2016-01-16 NOTE — Telephone Encounter (Signed)
i am not sure what is cauding the rash if they are not helping  She needs an OV thanks

## 2016-01-16 NOTE — Telephone Encounter (Signed)
LVM for pt to call back to inform her of below and see about getting her scheduled. Katharina Caper, Calan Doren D, [place]

## 2016-01-17 NOTE — Telephone Encounter (Signed)
Patient will call us back after she returns from out of town to make an appt, if needed.

## 2016-01-29 ENCOUNTER — Other Ambulatory Visit: Payer: Commercial Managed Care - HMO

## 2016-02-12 ENCOUNTER — Ambulatory Visit: Payer: Commercial Managed Care - HMO | Admitting: Internal Medicine

## 2016-02-21 ENCOUNTER — Encounter: Payer: Self-pay | Admitting: Gastroenterology

## 2016-02-21 ENCOUNTER — Other Ambulatory Visit: Payer: Commercial Managed Care - HMO

## 2016-02-21 ENCOUNTER — Ambulatory Visit (INDEPENDENT_AMBULATORY_CARE_PROVIDER_SITE_OTHER): Payer: Commercial Managed Care - HMO | Admitting: Gastroenterology

## 2016-02-21 VITALS — BP 136/78 | HR 72 | Ht 63.5 in | Wt 194.0 lb

## 2016-02-21 DIAGNOSIS — B182 Chronic viral hepatitis C: Secondary | ICD-10-CM

## 2016-02-21 DIAGNOSIS — K7460 Unspecified cirrhosis of liver: Secondary | ICD-10-CM | POA: Diagnosis not present

## 2016-02-21 DIAGNOSIS — Z86010 Personal history of colonic polyps: Secondary | ICD-10-CM | POA: Diagnosis not present

## 2016-02-21 NOTE — Progress Notes (Signed)
[name] Consult Note:  History: Latoya Swanson 02/21/2016  Referring physician: [name], MD  Reason for consult/chief complaint: No chief complaint on file. Cirrhosis  Subjective HPI:  This is the initial office visit for a 65 year old woman referred by infectious disease for evaluation of cirrhosis. Review of Dr. Henreitta Leber note from several months ago indicates that the patient has a genotype 1A hepatitis C virus infection, most likely from her reported previous illicit drug use. She was treated with Harvell A, and had a good four-week response. Did complete the treatment course, but it looks like she did not get her posttreatment PCR. She plans to go by the clinic today and have it drawn. She previously was a heavy alcohol user until about 10 years ago, now says it is "occasional". She has never had an upper endoscopy or upper GI bleeding. Some available archive records from [facility] indicate she had a tubular adenoma on a colonoscopy in early 2009. She denies change in bowel habits rectal bleeding dysphagia, odynophagia, nausea vomiting or weight loss.    ROS:  Review of Systems   Past Medical History: Past Medical History  Diagnosis Date  . 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  . Normal echocardiogram 08/19/2007  . Normal cardiac stress test 2008     [name]  . Hypertension   . Anxiety   . Depression   . GERD (gastroesophageal reflux disease)   . Hepatitis C     treated and cured  . Cirrhosis ([place])   . Arthritis   . Alcoholism ([place])   . History of bleeding peptic ulcer   . Colon polyps      Past Surgical History: Past Surgical History  Procedure Laterality Date  . Anal fissure repair    . Tubal ligation    . Carpal tunnel release Bilateral   . Rotator cuff repair Right     x 2  . Dilation and curettage of uterus      ectopic pregnancy     Family History: Family History  Problem Relation  Age of Onset  . Coronary artery disease Father     died age 1  . Breast cancer Mother     died age 13  . Colon cancer Mother   . Stomach cancer Father   . Arthritis Father   . Arthritis Mother   . Arthritis Maternal Grandmother   . Arthritis Paternal Grandmother   . Alzheimer's disease Maternal Aunt     x5  . Alzheimer's disease Mother   . Breast cancer Paternal Grandmother   . Breast cancer Other     PGA    Social History: Social History   Social History  . Marital Status: Single    Spouse Name: Latoya Swanson   . Number of Children: 2  . Years of Education: 15   Occupational History  . DISABLED    Social History Main Topics  . Smoking status: Former Smoker    Types: Cigarettes    Quit date: 02/20/2013  . Smokeless tobacco: Never Used     Comment: 2-3 cigs a day  . Alcohol Use: 0.0 oz/week    0 Standard drinks or equivalent per week     Comment: wine ocassional  . Drug Use: No  . Sexual Activity: Yes    Birth Control/ Protection: Post-menopausal   Other Topics Concern  . None   Social History Narrative   Lives with  her husband and her Chow dog.     Allergies: Allergies  Allergen Reactions  . Aspirin     Causes stomach bleeds hx of ulcers    Outpatient Meds: Current Outpatient Prescriptions  Medication Sig Dispense Refill  . amLODipine (NORVASC) 10 MG tablet TAKE 1 TABLET BY MOUTH DAILY 90 tablet 3  . aspirin 81 MG tablet Take 81 mg by mouth daily.      . baclofen (LIORESAL) 10 MG tablet TAKE 1 TABLET (10 MG TOTAL) BY MOUTH 2 (TWO) TIMES DAILY AS NEEDED FOR MUSCLE SPASMS. 60 tablet 11  . Cholecalciferol (VITAMIN D-3) 1000 UNITS CAPS Take 1 capsule (1,000 Units total) by mouth daily. 30 capsule 6  . clotrimazole (LOTRIMIN) 1 % cream Apply 1 application topically 2 (two) times daily. 45 g 2  . Cyanocobalamin (VITAMIN B-12) 500 MCG SUBL Place 500 mcg under the tongue daily.     . diclofenac (VOLTAREN) 75 MG EC tablet TAKE 1 TABLET (75 MG TOTAL) BY MOUTH  2 (TWO) TIMES DAILY AS NEEDED. 60 tablet 11  . FLUoxetine (PROZAC) 20 MG capsule Take 3 capsules (60 mg total) by mouth daily. 90 capsule 6  . gabapentin (NEURONTIN) 300 MG capsule Take 3 capsules (900 mg total) by mouth 3 (three) times daily. 270 capsule 3  . hydrocortisone 2.5 % cream Apply topically 2 (two) times daily. 45 g 2  . omeprazole (PRILOSEC) 20 MG capsule     . oxyCODONE (ROXICODONE) 5 MG immediate release tablet Take 1 tablet (5 mg total) by mouth every 6 (six) hours as needed for severe pain. 30 tablet 0  . traMADol (ULTRAM) 50 MG tablet TAKE 1 TABLET BY MOUTH EVERY 6 HOURS AS NEEDED FOR PAIN 100 tablet 5  . triamterene-hydrochlorothiazide (MAXZIDE-25) 37.5-25 MG tablet TAKE 1 TABLET BY MOUTH DAILY. 90 tablet 4   No current facility-administered medications for this visit.      ___________________________________________________________________ Objective  Exam:  BP 136/78 mmHg  Pulse 72  Ht 5' 3.5" (1.613 m)  Wt 194 lb (87.998 kg)  BMI 33.82 kg/m2  LMP 12/01/2001   General: this is a(n) Well-appearing older woman with good muscle mass   Eyes: sclera anicteric, no redness  ENT: oral mucosa moist without lesions, no cervical or supraclavicular lymphadenopathy, good dentition  CV: RRR without murmur, S1/S2, no JVD, no peripheral edema  Resp: clear to auscultation bilaterally, normal RR and effort noted  GI: soft, obese, no tenderness, with active bowel sounds. No guarding or palpable organomegaly noted.  Skin; warm and dry, no rash or jaundice noted. No spider nevi  Neuro: awake, alert and oriented x 3. Normal gross motor function and fluent speech. No asterixis, steady gait  Labs:   CMP Latest Ref Rng 09/21/2015 09/20/2015 09/19/2015  Glucose 65 - 99 mg/dL 93 97 105(H)  BUN 6 - 20 mg/dL 23(H) 15 10  Creatinine 0.44 - 1.00 mg/dL 1.02(H) 0.94 0.66  Sodium 135 - 145 mmol/L 137 136 138  Potassium 3.5 - 5.1 mmol/L 3.8 3.7 3.5  Chloride 101 - 111 mmol/L 107  102 104  CO2 22 - 32 mmol/L 25 27 25   Calcium 8.9 - 10.3 mg/dL 9.0 9.4 9.4  Total Protein 6.5 - 8.1 g/dL 6.3(L) - -  Total Bilirubin 0.3 - 1.2 mg/dL 0.6 - -  Alkaline Phos 38 - 126 U/L 99 - -  AST 15 - 41 U/L 55(H) - -  ALT 14 - 54 U/L 57(H) - -   Lab Results  Component Value Date   WBC 7.6 09/19/2015   HGB 12.3 09/19/2015   HCT 36.9 09/19/2015   MCV 94.1 09/19/2015   PLT 129* 09/19/2015   INR 1.11 Jun 2015  Radiologic Studies: December 2016 liver ultrasound shows no mass, there is a cirrhotic appearance  Assessment: Encounter Diagnoses  Name Primary?  . Cirrhosis of liver without ascites, unspecified hepatic cirrhosis type ([place]) Yes  . Personal history of colonic polyps   . Chronic hepatitis C without hepatic coma (HCC)     She appears to have compensated hepatic cirrhosis with a low meld score due to hepatitis C infection and previous alcohol abuse.  Plan:  She is encouraged to get her posttreatment PCR checked to see if there is an SVR. Complete alcohol abstinence was recommended EGD to screen for esophageal varices Colonoscopy for polyp surveillance  The benefits and risks of the planned procedure were described in detail with the patient or (when appropriate) their health care proxy.  Risks were outlined as including, but not limited to, bleeding, infection, perforation, adverse medication reaction leading to cardiac or pulmonary decompensation, or pancreatitis (if ERCP).  The limitation of incomplete mucosal visualization was also discussed.  No guarantees or warranties were given.  I also told her I would like to see her every 6 months to keep track of her cirrhosis and for hepatoma screening  Thank you for the courtesy of this consult.  Please call me with any questions or concerns.  Nelida Meuse III

## 2016-02-21 NOTE — Patient Instructions (Signed)
You have been scheduled for a colonoscopy/endo. Please follow written instructions given to you at your visit today.  Please pick up your prep supplies at the pharmacy within the next 1-3 days. If you use inhalers (even only as needed), please bring them with you on the day of your procedure. Your physician has requested that you go to www.startemmi.com and enter the access code given to you at your visit today. This web site gives a general overview about your procedure. However, you should still follow specific instructions given to you by our office regarding your preparation for the procedure.  If you are age 40 or younger, your body mass index should be between 19-25. Your Body mass index is 33.82 kg/(m^2). If this is out of the aformentioned range listed, please consider follow up with your Primary Care Provider.   Thank you for choosing [facility]  Dr Wilfrid Lund III

## 2016-02-22 LAB — HEPATITIS C RNA QUANTITATIVE: HCV Quantitative: NOT DETECTED IU/mL (ref <15)

## 2016-03-20 ENCOUNTER — Encounter: Payer: Commercial Managed Care - HMO | Admitting: Gastroenterology

## 2016-03-27 ENCOUNTER — Ambulatory Visit: Payer: Commercial Managed Care - HMO | Admitting: Internal Medicine

## 2016-04-15 ENCOUNTER — Ambulatory Visit (INDEPENDENT_AMBULATORY_CARE_PROVIDER_SITE_OTHER): Payer: Commercial Managed Care - HMO | Admitting: Family Medicine

## 2016-04-15 ENCOUNTER — Encounter: Payer: Self-pay | Admitting: Family Medicine

## 2016-04-15 VITALS — BP 164/91 | HR 69 | Temp 97.9°F | Wt 191.0 lb

## 2016-04-15 DIAGNOSIS — M25559 Pain in unspecified hip: Secondary | ICD-10-CM | POA: Diagnosis not present

## 2016-04-15 DIAGNOSIS — M25551 Pain in right hip: Secondary | ICD-10-CM

## 2016-04-15 DIAGNOSIS — R7309 Other abnormal glucose: Secondary | ICD-10-CM | POA: Diagnosis not present

## 2016-04-15 LAB — BASIC METABOLIC PANEL WITH GFR
BUN: 14 mg/dL (ref 7–25)
CO2: 22 mmol/L (ref 20–31)
Calcium: 9.2 mg/dL (ref 8.6–10.4)
Chloride: 107 mmol/L (ref 98–110)
Creat: 0.66 mg/dL (ref 0.50–0.99)
GFR, Est African American: >89 mL/min (ref >=60)
GFR, Est Non African American: >89 mL/min (ref >=60)
Glucose, Bld: 90 mg/dL (ref 65–99)
Potassium: 3.6 mmol/L (ref 3.5–5.3)
Sodium: 141 mmol/L (ref 135–146)

## 2016-04-15 LAB — CBC
HCT: 38.8 % (ref 35.0–45.0)
Hemoglobin: 13.3 g/dL (ref 11.7–15.5)
MCH: 30.9 pg (ref 27.0–33.0)
MCHC: 34.3 g/dL (ref 32.0–36.0)
MCV: 90.2 fL (ref 80.0–100.0)
MPV: 11.9 fL (ref 7.5–12.5)
Platelets: 154 10*3/uL (ref 140–400)
RBC: 4.3 MIL/uL (ref 3.80–5.10)
RDW: 13.1 % (ref 11.0–15.0)
WBC: 6.1 10*3/uL (ref 3.8–10.8)

## 2016-04-15 LAB — POCT GLYCOSYLATED HEMOGLOBIN (HGB A1C): Hemoglobin A1C: 4.9

## 2016-04-15 MED ORDER — OXYCODONE HCL 5 MG PO TABS: 5.0000 mg | ORAL_TABLET | Freq: Four times a day (QID) | ORAL | Status: DC | PRN

## 2016-04-15 NOTE — Patient Instructions (Signed)
I have refilled your oxycodone for your right hip pain.  - You can take 1 pill every 6 hours as needed for your pain.  - Do not drive while taking this medication  - Continue to do your hip and back exercises as well as work on weight loss which will reduce your back pain in the [place] run

## 2016-04-15 NOTE — Progress Notes (Signed)
   Subjective:    Patient ID: Latoya Swanson, female    DOB: 08-Jan-1951, 65 y.o.   MRN: [id number]  Seen for Same day visit for   CC: Back pain  She comes in today for acute exacerbation of her chronic right-sided back pain.  Describes sharp, right-sided lower back pain with radiation to right foot.  Pain 10/10 for the past to 3 days.  Reports little improvement with gabapentin 900 mg 3 times a day, Ultram 100 mg daily, diclofenac 150 mg twice a day.  Reports history of similar exacerbations treated by PCP with oxycodone 5 mg. Reports working with physical therapy previously and endorses continuing recommended exercises.   Red Flags  Urinary retention: no   Numbness/Weakness: no   Fever/chills/sweats: no   Night pain: no   Unexplained weight loss: no   No relief with bedrest: yes   Hx of cancer/immunosuppression: no   Hx of IV drug use: no   Hx of osteoporosis or chronic steroid use: no   Smoking history noted   Review of Systems   See HPI for ROS. Objective:  BP 164/91 mmHg  Pulse 69  Temp(Src) 97.9 F (36.6 C) (Oral)  Wt 191 lb (86.637 kg)  LMP 12/01/2001  General: NAD Back Exam: Inspection: No erythema or rash Motion: SLR seated:  Negative            XSLR seated: Negative        Palpable tenderness: Diffuse tenderness in right lumbar, right buttocks, right lateral thigh FABER: Negative Sensory change: No Reflex change: No Bilateral lower extremity strength 5/5 in hip flexion/abduction/adduction, knee flexion/extension, ankle dorsiflexion/plantar flexion     Assessment & Plan:   Pain in right hip Most likely acute on chronic right lower back pain with radiation into right hip and leg.  No symptoms of infection or neurovascular compromise.  - Instructed patient that [place]-term improvement and decrease exacerbations most likely be seen with consistent exercise/physical therapy and weight loss - Refill oxycodone 5 mg #30; when necessary every 6 hours - Check  CBC, BMP, given Strum-term NSAID use - Recommended following up with PCP in the next 1-2 weeks after acute exacerbation has resolved to discuss Ganus-term management options   2. Elevated glucose - POCT glycosylated hemoglobin (Hb A1C)

## 2016-04-15 NOTE — Assessment & Plan Note (Addendum)
Most likely acute on chronic right lower back pain with radiation into right hip and leg.  No symptoms of infection or neurovascular compromise.  - Instructed patient that Fearn-term improvement and decrease exacerbations most likely be seen with consistent exercise/physical therapy and weight loss - Refill oxycodone 5 mg #30; when necessary every 6 hours - Check CBC, BMP, given Kauffmann-term NSAID use - Recommended following up with PCP in the next 1-2 weeks after acute exacerbation has resolved to discuss Newhart-term management options

## 2016-05-05 ENCOUNTER — Encounter: Payer: Self-pay | Admitting: Family Medicine

## 2016-05-05 ENCOUNTER — Ambulatory Visit (INDEPENDENT_AMBULATORY_CARE_PROVIDER_SITE_OTHER): Payer: Commercial Managed Care - HMO | Admitting: Family Medicine

## 2016-05-05 VITALS — BP 137/84 | HR 72 | Temp 98.5°F | Wt 186.0 lb

## 2016-05-05 DIAGNOSIS — M25551 Pain in right hip: Secondary | ICD-10-CM | POA: Diagnosis not present

## 2016-05-05 DIAGNOSIS — I10 Essential (primary) hypertension: Secondary | ICD-10-CM | POA: Diagnosis not present

## 2016-05-05 DIAGNOSIS — F32A Depression, unspecified: Secondary | ICD-10-CM

## 2016-05-05 DIAGNOSIS — F329 Major depressive disorder, single episode, unspecified: Secondary | ICD-10-CM

## 2016-05-05 NOTE — Patient Instructions (Addendum)
.  Good to see you today!  Thanks for coming in.  Dr Paulla Dolly is your podiatrist  Set up a colonoscopy  Come back in 6 months

## 2016-05-05 NOTE — Assessment & Plan Note (Signed)
Improved.  Continue gabapentin and as needed analgesics

## 2016-05-05 NOTE — Assessment & Plan Note (Signed)
Stable.  Continue current medications.

## 2016-05-05 NOTE — Progress Notes (Signed)
Subjective  Patient is presenting with the following illnesses  Back Pain Improved.  Is trying to stay active.  [place] medications as indicated.  No weakness or incontinence.No gi upset from medications  Depression Is overall feeling well.  Feels she needs the prozac.  Active in her garden.  No suicidal ideation or depressive feelings  HYPERTENSION Disease Monitoring Home BP Monitoring (Severity) not checking Symptoms - Chest pain- no    Dyspnea- no Medications(Modifying factors) Compliance-  daily. Lightheadedness-  no  Edema- no Timing - continuous  Duration - years ROS - See HPI  PMH Lab Review   POTASSIUM  Date Value Ref Range Status  04/15/2016 3.6 3.5 - 5.3 mmol/L Final   SODIUM  Date Value Ref Range Status  04/15/2016 141 135 - 146 mmol/L Final   CREAT  Date Value Ref Range Status  04/15/2016 0.66 0.50 - 0.99 mg/dL Final   CREATININE, SER  Date Value Ref Range Status  09/21/2015 1.02* 0.44 - 1.00 mg/dL Final         hief Complaint noted Review of Symptoms - see HPI PMH - Smoking status noted.     Objective Vital Signs reviewed Alert nad able to get up and down from exam table without assistance No LE weakness     Assessments/Plans  See Encounter view if individual problem A/Ps not visible See after visit summary for details of patient instuctions

## 2016-05-05 NOTE — Assessment & Plan Note (Signed)
BP Readings from Last 3 Encounters:  05/05/16 137/84  04/15/16 164/91  02/21/16 136/78    Stable  Continue current medications

## 2016-05-27 ENCOUNTER — Telehealth: Payer: Self-pay | Admitting: *Deleted

## 2016-05-27 DIAGNOSIS — L600 Ingrowing nail: Secondary | ICD-10-CM

## 2016-05-27 NOTE — Telephone Encounter (Signed)
Patient needs a new referral placed for [facility] with Dr. Paulla Dolly.  Once placed in Epic I can place it online with [facility]. [name],CMA

## 2016-05-28 ENCOUNTER — Other Ambulatory Visit: Payer: Self-pay | Admitting: Family Medicine

## 2016-05-28 DIAGNOSIS — L600 Ingrowing nail: Secondary | ICD-10-CM | POA: Insufficient documentation

## 2016-05-28 NOTE — Telephone Encounter (Signed)
Pls call in tramadol thansk

## 2016-05-28 NOTE — Telephone Encounter (Signed)
Rx called into for pt. Page, cma.

## 2016-05-28 NOTE — Telephone Encounter (Signed)
Done. Thanks.

## 2016-06-04 ENCOUNTER — Encounter: Payer: Commercial Managed Care - HMO | Admitting: Podiatry

## 2016-06-06 IMAGING — CT CT T SPINE W/O CM
3 of 6 series · 11 of 33 positions shown, 13 images · non-contrast
Comparison: None.

CLINICAL DATA: 64-year-old with multiple recent falls complaining
of mid and low back pain radiating into the right lower extremity.
Point tenderness in the mid thoracic region. Initial encounter.

EXAM:
CT THORACIC SPINE WITHOUT CONTRAST
TECHNIQUE: Multidetector CT imaging of the thoracic spine was performed without
intravenous contrast administration. Multiplanar CT image
reconstructions were also generated.

[Series 4: t-spine 2.0 i30s 3 · axial · 0.28mm/px · z∈[+1044,+1358]mm · 3 of 158 slices shown, 4 images]
[im 1/158  soft-tissue]
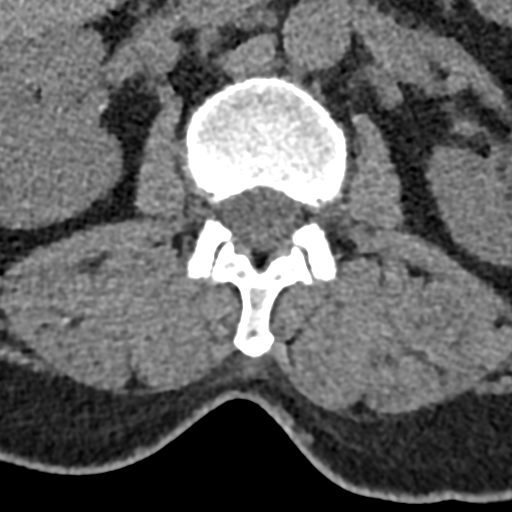
[im 1/158  bone]
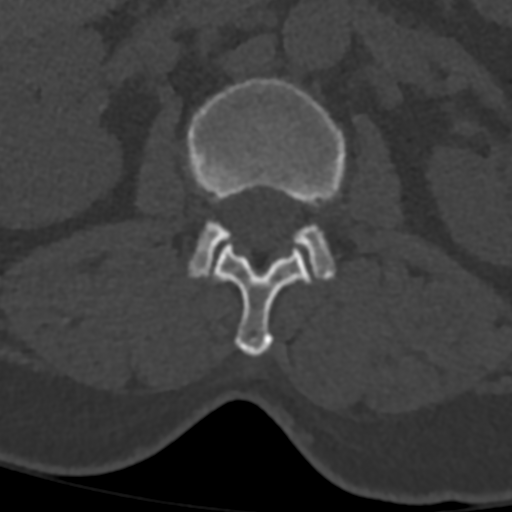
[im 79/158  bone]
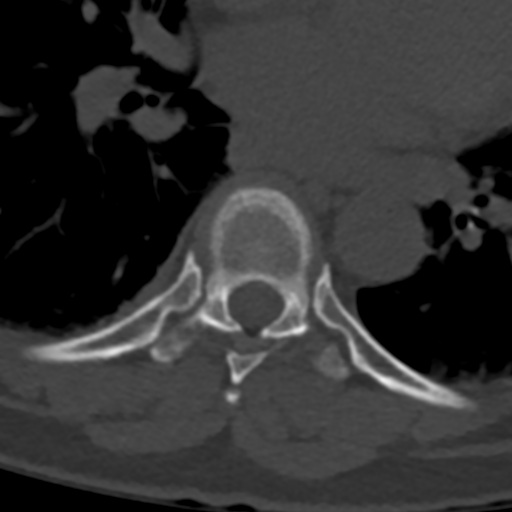
[im 158/158  bone]
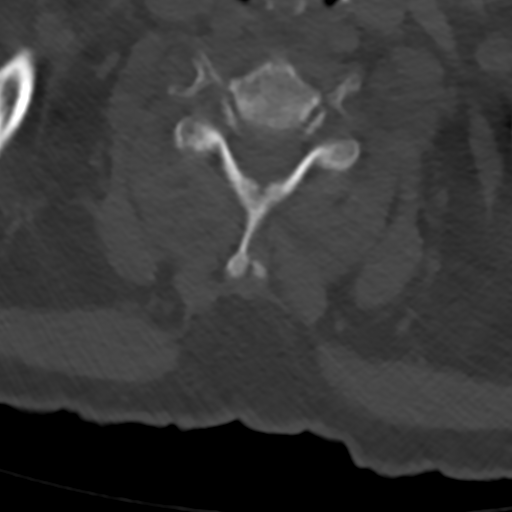

[Series 8: sagittal st · sagittal · 0.31mm/px · 5 of 61 slices shown, 6 images]
[im 21/61  bone]
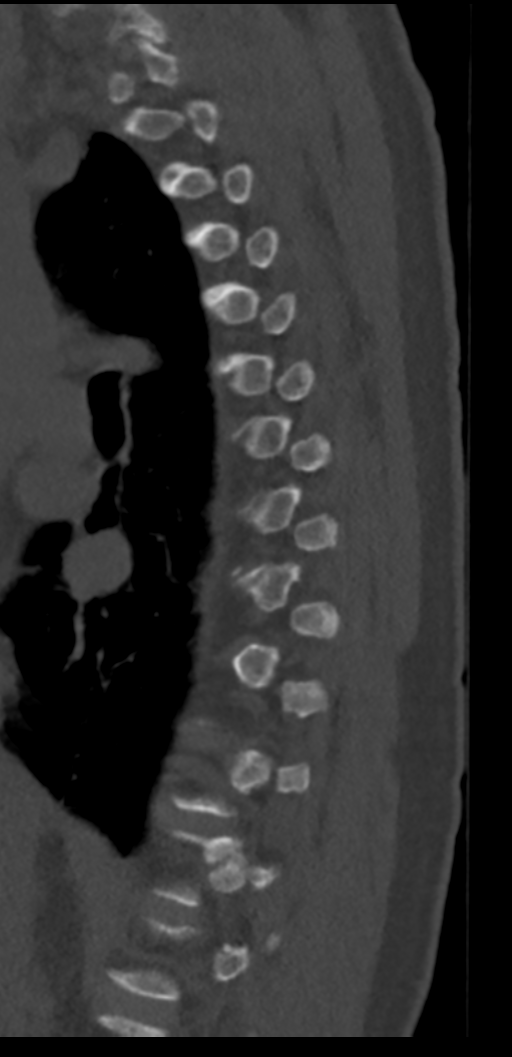
[im 26/61  bone]
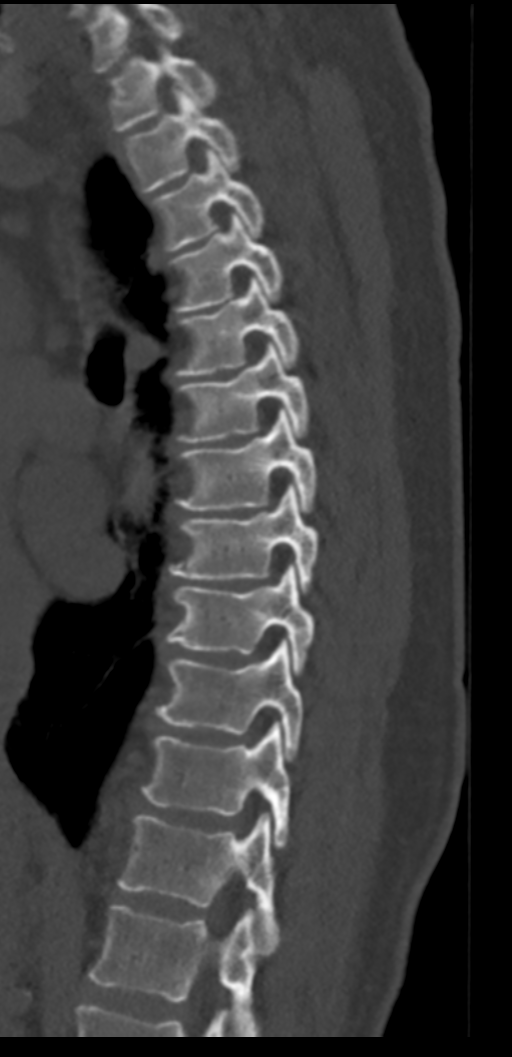
[im 31/61  soft-tissue]
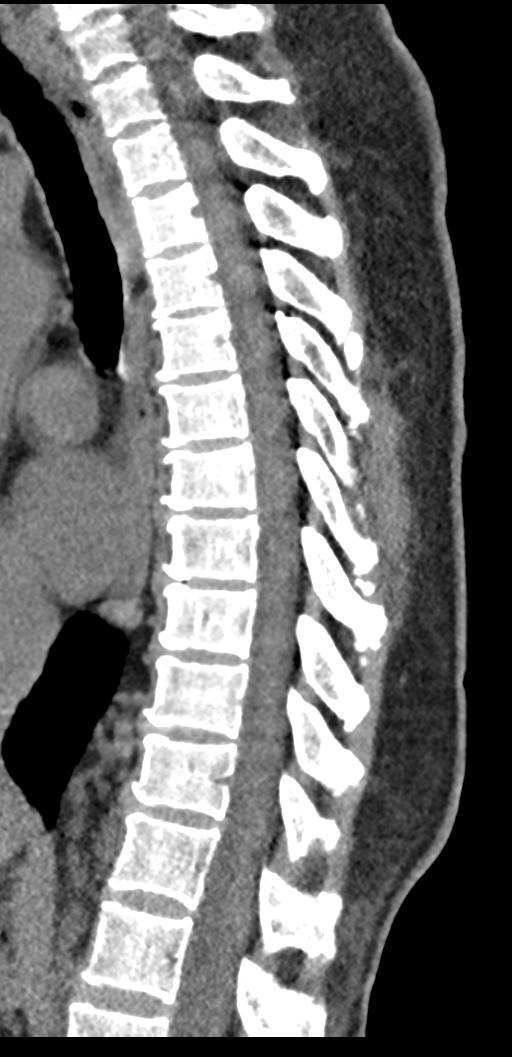
[im 31/61  bone]
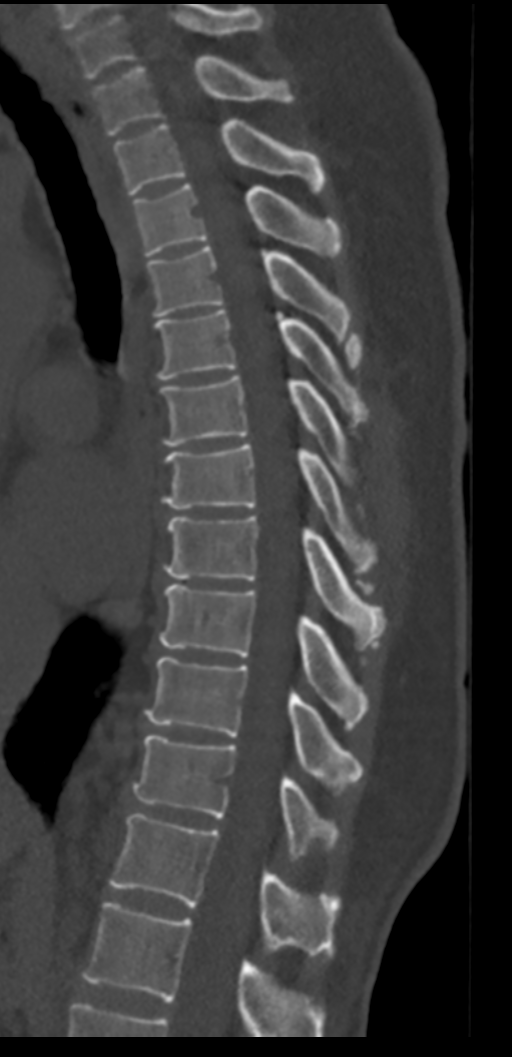
[im 36/61  bone]
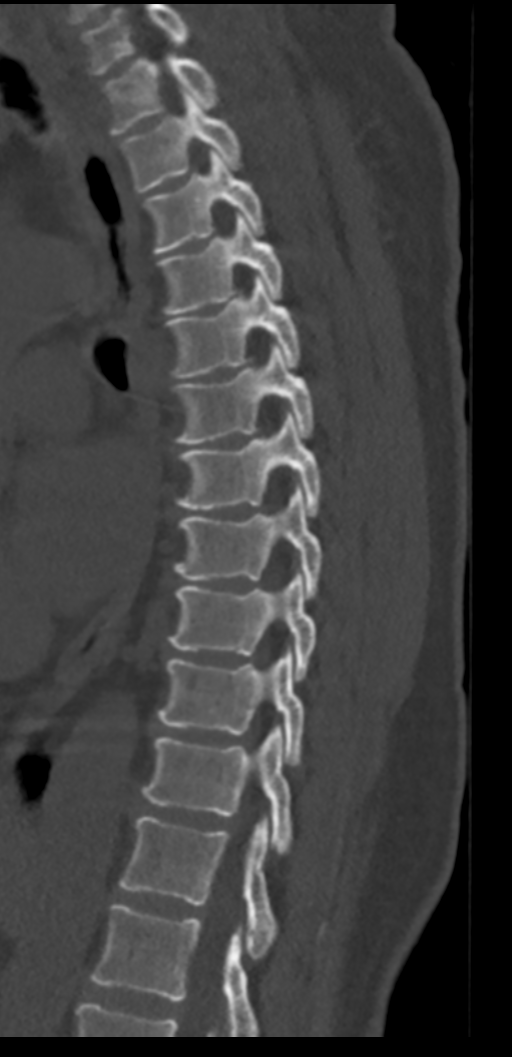
[im 41/61  bone]
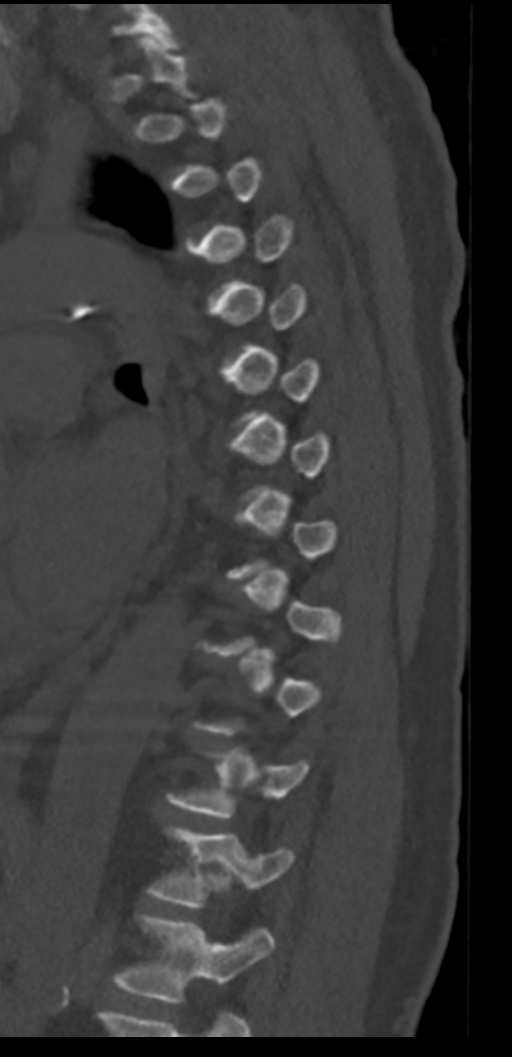

[Series 10: soft thins · axial · 0.28mm/px · z∈[+1075,+1138]mm · 3 of 524 slices shown]
[im 53/524  soft-tissue]
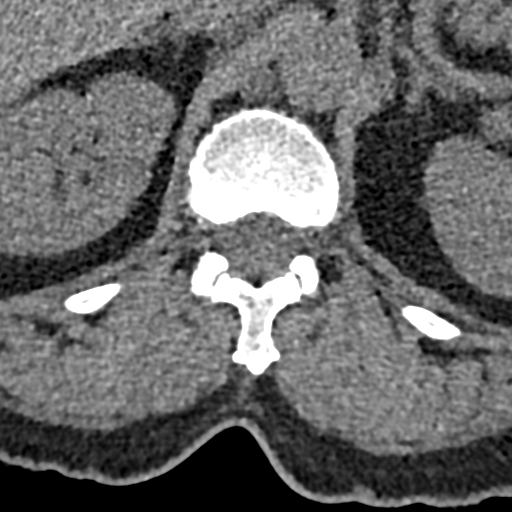
[im 105/524  soft-tissue]
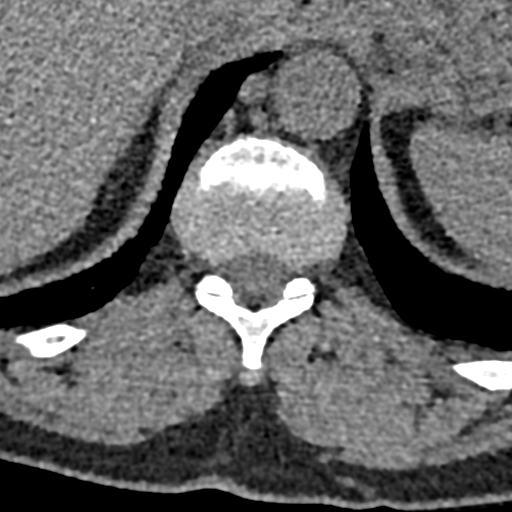
[im 157/524  soft-tissue]
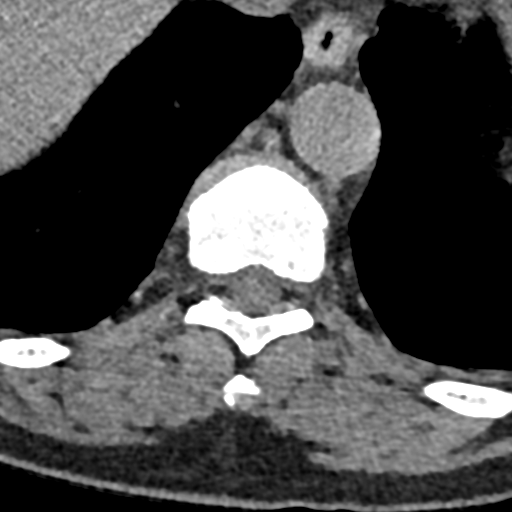

[11 of 33 positions shown; findings below may reference images not displayed]

FINDINGS: Twelve rib-bearing thoracic vertebrae with anatomic alignment. No
acute fractures. Disc space narrowing with mild endplate
hypertrophic changes at T4-5, T5-6, T6-7, T7-8, T8-9, T9-10, T10-11
and T11-12. Calcification/ossification in the interspinous ligament
at T3-4, T4-5, T5-6, T6-7 and T7-8. Soft tissue window images
demonstrate mild circumferential disc bulges at T9-10, T10-11 and
T11-12 without frank disc extrusions. No spinal stenosis.
IMPRESSION: 1. No acute or significant abnormalities involving the thoracic
spine.
2. Mild degenerative disc disease and spondylosis involving the mid
and lower thoracic spine.

## 2016-06-23 ENCOUNTER — Encounter: Payer: Commercial Managed Care - HMO | Admitting: Podiatry

## 2016-07-10 NOTE — Progress Notes (Signed)
This encounter was created in error - please disregard.

## 2016-08-20 ENCOUNTER — Other Ambulatory Visit: Payer: Self-pay | Admitting: Family Medicine

## 2016-09-01 ENCOUNTER — Encounter: Payer: Self-pay | Admitting: Family Medicine

## 2016-09-01 ENCOUNTER — Ambulatory Visit (INDEPENDENT_AMBULATORY_CARE_PROVIDER_SITE_OTHER): Payer: Commercial Managed Care - HMO | Admitting: Family Medicine

## 2016-09-01 VITALS — BP 146/80 | HR 80 | Temp 97.8°F | Wt 188.0 lb

## 2016-09-01 DIAGNOSIS — Z23 Encounter for immunization: Secondary | ICD-10-CM | POA: Diagnosis not present

## 2016-09-01 DIAGNOSIS — M539 Dorsopathy, unspecified: Secondary | ICD-10-CM

## 2016-09-01 DIAGNOSIS — M25551 Pain in right hip: Secondary | ICD-10-CM | POA: Diagnosis not present

## 2016-09-01 DIAGNOSIS — M5386 Other specified dorsopathies, lumbar region: Secondary | ICD-10-CM | POA: Insufficient documentation

## 2016-09-01 MED ORDER — PREDNISONE 20 MG PO TABS: 60.0000 mg | ORAL_TABLET | Freq: Every day | ORAL | 0 refills | Status: AC

## 2016-09-01 MED ORDER — PROMETHAZINE HCL 25 MG PO TABS: 25.0000 mg | ORAL_TABLET | Freq: Four times a day (QID) | ORAL | 2 refills | Status: DC | PRN

## 2016-09-01 MED ORDER — DEXAMETHASONE SODIUM PHOSPHATE 100 MG/10ML IJ SOLN
10.0000 mg | Freq: Once | INTRAMUSCULAR | Status: AC
  Administered 2016-09-01: 10 mg via INTRAMUSCULAR

## 2016-09-01 MED ORDER — OXYCODONE HCL 5 MG PO TABS: 5.0000 mg | ORAL_TABLET | Freq: Four times a day (QID) | ORAL | 0 refills | Status: DC | PRN

## 2016-09-01 NOTE — Assessment & Plan Note (Signed)
Trial of steroid, including an IM dose in the office. Pain meds for now. Please call your orthopedist for a follow-up appointment. Do not take tramadol or diclofenac while on Prednisone and Oxycodone. You may continue your Gabapentin.

## 2016-09-01 NOTE — Patient Instructions (Addendum)
You had a strong steroid shot today. Please start the oral steroid (Prednisone) tomorrow. Please rest for the next few days. Please call your orthopedist for a follow-up appointment. Do not take tramadol or diclofenac while on Prednisone and Oxycodone. You may continue your Gabapentin.  Hip Pain Your hip is the joint between your upper legs and your lower pelvis. The bones, cartilage, tendons, and muscles of your hip joint perform a lot of work each day supporting your body weight and allowing you to move around. Hip pain can range from a minor ache to severe pain in one or both of your hips. Pain may be felt on the inside of the hip joint near the groin, or the outside near the buttocks and upper thigh. You may have swelling or stiffness as well.  HOME CARE INSTRUCTIONS   Take medicines only as directed by your health care provider.  Apply ice to the injured area:  Put ice in a plastic bag.  Place a towel between your skin and the bag.  Leave the ice on for 15-20 minutes at a time, 3-4 times a day.  Keep your leg raised (elevated) when possible to lessen swelling.  Avoid activities that cause pain.  Follow specific exercises as directed by your health care provider.  Sleep with a pillow between your legs on your most comfortable side.  Record how often you have hip pain, the location of the pain, and what it feels like. SEEK MEDICAL CARE IF:   You are unable to put weight on your leg.  Your hip is red or swollen or very tender to touch.  Your pain or swelling continues or worsens after 1 week.  You have increasing difficulty walking.  You have a fever. SEEK IMMEDIATE MEDICAL CARE IF:   You have fallen.  You have a sudden increase in pain and swelling in your hip. MAKE SURE YOU:   Understand these instructions.  Will watch your condition.  Will get help right away if you are not doing well or get worse.   This information is not intended to replace advice given to  you by your health care provider. Make sure you discuss any questions you have with your health care provider.   Document Released: 04/16/2010 Document Revised: 11/17/2014 Document Reviewed: 06/23/2013 Elsevier Interactive Patient Education [employer].

## 2016-09-01 NOTE — Progress Notes (Signed)
   Subjective:    Patient ID: Latoya Swanson is a 65 y.o. female presenting with No chief complaint on file.  on 09/01/2016  HPI: Patient has h/o right sided sciatica. Now with bad pain on right and left. Pain got severe 5 days ago. Pain is radiating down both legs. Has tried heating, soaking, icing and pain is too much to bear. Has previously been admitted with a similar issue last November. Had MRI at that time and noted to have some disc bulging but no neural impingement. Pain is burning in nature. Left foot is swollen and is quite tender to touch. Had significant w/u last November with admission, without sign/sx's of RA, or elevated ESR or CRP. Has previously attempted PT and notes no benefit from physical therapy. Has not benefited from tramadol and gabapentin and diclofenac.  Review of Systems  Constitutional: Negative for chills and fever.  Respiratory: Negative for shortness of breath.   Cardiovascular: Negative for chest pain.  Gastrointestinal: Negative for abdominal pain, nausea and vomiting.  Genitourinary: Negative for dysuria.  Skin: Negative for rash.      Objective:    BP (!) 146/80   Pulse 80   Temp 97.8 F (36.6 C) (Oral)   Wt 188 lb (85.3 kg)   LMP 12/01/2001   SpO2 100%   BMI 32.78 kg/m  Physical Exam  Constitutional: She is oriented to person, place, and time. She appears well-developed and well-nourished. No distress.  HENT:  Head: Normocephalic and atraumatic.  Eyes: No scleral icterus.  Neck: Neck supple.  Cardiovascular: Normal rate.   Pulmonary/Chest: Effort normal.  Abdominal: Soft.  Musculoskeletal: She exhibits edema (left foot) and tenderness (left foot).  + SLR on right  Neurological: She is alert and oriented to person, place, and time. She displays no atrophy and no tremor. Gait abnormal.  Reflex Scores:      Patellar reflexes are 1+ on the right side and 1+ on the left side. Cannot dorsiflex left foot  Skin: Skin is warm and dry.    Psychiatric: She has a normal mood and affect.   MRI results 09/2015 when admitted with similar problem. IMPRESSION: Disc protrusions at L4-5 and L5-S1 without focal neural impingement.    Assessment & Plan:   Problem List Items Addressed This Visit      Unprioritized   Pain in right hip - Primary   Sciatica associated with disorder of lumbar spine    Trial of steroid, including an IM dose in the office. Pain meds for now. Please call your orthopedist for a follow-up appointment. Do not take tramadol or diclofenac while on Prednisone and Oxycodone. You may continue your Gabapentin.      Relevant Medications   predniSONE (DELTASONE) 20 MG tablet   oxyCODONE (ROXICODONE) 5 MG immediate release tablet    Other Visit Diagnoses    Encounter for immunization       Relevant Orders   Flu Vaccine QUAD 36+ mos IM (Completed)      Total face-to-face time with patient: 15 minutes. Over 50% of encounter was spent on counseling and coordination of care. Return in about 4 weeks (around 09/29/2016) for a follow-up.  Donnamae Jude 09/01/2016 3:34 PM

## 2016-09-15 ENCOUNTER — Other Ambulatory Visit: Payer: Self-pay | Admitting: *Deleted

## 2016-09-15 MED ORDER — FLUOXETINE HCL 20 MG PO CAPS: 60.0000 mg | ORAL_CAPSULE | Freq: Every day | ORAL | 3 refills | Status: DC

## 2016-10-11 ENCOUNTER — Other Ambulatory Visit: Payer: Self-pay | Admitting: Family Medicine

## 2016-11-18 ENCOUNTER — Ambulatory Visit (INDEPENDENT_AMBULATORY_CARE_PROVIDER_SITE_OTHER): Payer: Medicare HMO | Admitting: Obstetrics and Gynecology

## 2016-11-18 ENCOUNTER — Encounter: Payer: Self-pay | Admitting: Obstetrics and Gynecology

## 2016-11-18 DIAGNOSIS — M539 Dorsopathy, unspecified: Secondary | ICD-10-CM | POA: Diagnosis not present

## 2016-11-18 DIAGNOSIS — M5386 Other specified dorsopathies, lumbar region: Secondary | ICD-10-CM

## 2016-11-18 MED ORDER — GABAPENTIN 600 MG PO TABS: 600.0000 mg | ORAL_TABLET | Freq: Three times a day (TID) | ORAL | 0 refills | Status: DC

## 2016-11-18 MED ORDER — OXYCODONE HCL 5 MG PO TABS: 5.0000 mg | ORAL_TABLET | Freq: Four times a day (QID) | ORAL | 0 refills | Status: DC | PRN

## 2016-11-18 MED ORDER — PREDNISONE 20 MG PO TABS: 60.0000 mg | ORAL_TABLET | Freq: Every day | ORAL | 0 refills | Status: AC

## 2016-11-18 NOTE — Patient Instructions (Addendum)
Take steroids for 5 days. Do not take diclofenac or tramadol while taking Gabapentin diose increased Pain medication given  Keep follow-up with PCP   Sciatica Sciatica is pain, numbness, weakness, or tingling along the path of the sciatic nerve. The sciatic nerve starts in the lower back and runs down the back of each leg. The nerve controls the muscles in the lower leg and in the back of the knee. It also provides feeling (sensation) to the back of the thigh, the lower leg, and the sole of the foot. Sciatica is a symptom of another medical condition that pinches or puts pressure on the sciatic nerve. Generally, sciatica only affects one side of the body. Sciatica usually goes away on its own or with treatment. In some cases, sciatica may keep coming back (recur). What are the causes? This condition is caused by pressure on the sciatic nerve, or pinching of the sciatic nerve. This may be the result of:  A disk in between the bones of the spine (vertebrae) bulging out too far (herniated disk).  Age-related changes in the spinal disks (degenerative disk disease).  A pain disorder that affects a muscle in the buttock (piriformis syndrome).  Extra bone growth (bone spur) near the sciatic nerve.  An injury or break (fracture) of the pelvis.  Pregnancy.  Tumor (rare). What increases the risk? The following factors may make you more likely to develop this condition:  Playing sports that place pressure or stress on the spine, such as football or weight lifting.  Having poor strength and flexibility.  A history of back injury.  A history of back surgery.  Sitting for Estell periods of time.  Doing activities that involve repetitive bending or lifting.  Obesity. What are the signs or symptoms? Symptoms can vary from mild to very severe, and they may include:  Any of these problems in the lower back, leg, hip, or buttock:  Mild tingling or dull aches.  Burning sensations.  Sharp  pains.  Numbness in the back of the calf or the sole of the foot.  Leg weakness.  Severe back pain that makes movement difficult. These symptoms may get worse when you cough, sneeze, or laugh, or when you sit or stand for Querry periods of time. Being overweight may also make symptoms worse. In some cases, symptoms may recur over time. How is this diagnosed? This condition may be diagnosed based on:  Your symptoms.  A physical exam. Your health care provider may ask you to do certain movements to check whether those movements trigger your symptoms.  You may have tests, including:  Blood tests.  X-rays.  MRI.  CT scan. How is this treated? In many cases, this condition improves on its own, without any treatment. However, treatment may include:  Reducing or modifying physical activity during periods of pain.  Exercising and stretching to strengthen your abdomen and improve the flexibility of your spine.  Icing and applying heat to the affected area.  Medicines that help:  To relieve pain and swelling.  To relax your muscles.  Injections of medicines that help to relieve pain, irritation, and inflammation around the sciatic nerve (steroids).  Surgery. Follow these instructions at home: Medicines  Take over-the-counter and prescription medicines only as told by your health care provider.  Do not drive or operate heavy machinery while taking prescription pain medicine. Managing pain  If directed, apply ice to the affected area.  Put ice in a plastic bag.  Place a towel between  your skin and the bag.  Leave the ice on for 20 minutes, 2-3 times a day.  After icing, apply heat to the affected area before you exercise or as often as told by your health care provider. Use the heat source that your health care provider recommends, such as a moist heat pack or a heating pad.  Place a towel between your skin and the heat source.  Leave the heat on for 20-30  minutes.  Remove the heat if your skin turns bright red. This is especially important if you are unable to feel pain, heat, or cold. You may have a greater risk of getting burned. Activity  Return to your normal activities as told by your health care provider. Ask your health care provider what activities are safe for you.  Avoid activities that make your symptoms worse.  Take brief periods of rest throughout the day. Resting in a lying or standing position is usually better than sitting to rest.  When you rest for longer periods, mix in some mild activity or stretching between periods of rest. This will help to prevent stiffness and pain.  Avoid sitting for Fraley periods of time without moving. Get up and move around at least one time each hour.  Exercise and stretch regularly, as told by your health care provider.  Do not lift anything that is heavier than 10 lb (4.5 kg) while you have symptoms of sciatica. When you do not have symptoms, you should still avoid heavy lifting, especially repetitive heavy lifting.  When you lift objects, always use proper lifting technique, which includes:  Bending your knees.  Keeping the load close to your body.  Avoiding twisting. General instructions  Use good posture.  Avoid leaning forward while sitting.  Avoid hunching over while standing.  Maintain a healthy weight. Excess weight puts extra stress on your back and makes it difficult to maintain good posture.  Wear supportive, comfortable shoes. Avoid wearing high heels.  Avoid sleeping on a mattress that is too soft or too hard. A mattress that is firm enough to support your back when you sleep may help to reduce your pain.  Keep all follow-up visits as told by your health care provider. This is important. Contact a health care provider if:  You have pain that wakes you up when you are sleeping.  You have pain that gets worse when you lie down.  Your pain is worse than you have  experienced in the past.  Your pain lasts longer than 4 weeks.  You experience unexplained weight loss. Get help right away if:  You lose control of your bowel or bladder (incontinence).  You have:  Weakness in your lower back, pelvis, buttocks, or legs that gets worse.  Redness or swelling of your back.  A burning sensation when you urinate. This information is not intended to replace advice given to you by your health care provider. Make sure you discuss any questions you have with your health care provider. Document Released: 10/21/2001 Document Revised: 04/01/2016 Document Reviewed: 07/06/2015 Elsevier Interactive Patient Education  2017 [employer].

## 2016-11-18 NOTE — Progress Notes (Signed)
Subjective:   Patient ID: Latoya Swanson, female    DOB: 1951/08/31, 66 y.o.   MRN: [id number]  Patient presents for Same Day Appointment  Chief Complaint  Patient presents with  . Sciatica    HPI: # Sciatica Patient has known sciatica that PCP has been treating for the last 3-4 years Patient states she currently has flare When she gets a flare she is unable to walk or do any of her usual activities Has been hospitalized before for this Over the holidays was unable to walk for 4 days and had to use bedside commode Pain is on the right side and radiates down to her foot Describes pain as excruciating States her usual medications of gabapentin, tramadol, diclofenac do not work when she is inflamed Has had to take oxycodone for pain Even increased her gabapentin to 400 mg 3 times a day Discharge several therapies before such as PT has not helped Heating pad helps temporarily Wants surgical intervention [place] the other day because couldn't stand Been dealing with this Bounds time and states that she is noticing that her mood is now changing and she is more irritable. States that she cannot live in this type of pain.  Red Flags  Urinary retention: no   Numbness/Weakness: no   Fever/chills/sweats: no   Night pain: no   Unexplained weight loss: no   Hx of cancer/immunosuppression: no   Hx of osteoporosis or chronic steroid use: no   Review of Systems   See HPI for ROS.   History  Smoking Status  . Former Smoker  . Types: Cigarettes  . Quit date: 02/20/2013  Smokeless Tobacco  . Never Used    Comment: 2-3 cigs a day    Past medical history, surgical, family, and social history reviewed and updated in the EMR as appropriate.  Objective:  BP (!) 142/78   Pulse 64   Temp 98 F (36.7 C) (Oral)   Wt 185 lb (83.9 kg)   LMP 12/01/2001   SpO2 98%   BMI 32.26 kg/m  Vitals and nursing note reviewed  Physical Exam  Constitutional: She appears distressed.    Well-developed and nourished  Cardiovascular: Normal rate and regular rhythm.   Pulmonary/Chest: Effort normal and breath sounds normal.  Musculoskeletal: Normal range of motion.  Neurological: She has normal sensation and normal strength. She has an abnormal Straight Leg Raise Test. Gait abnormal.  Back Exam: Inspection: No erythema or rash Palpable tenderness: Diffuse tenderness in right lumbar, right buttocks, right lateral thigh  Assessment & Plan:  1. Sciatica associated with disorder of lumbar spine Jons-standing chronic condition. Currently with an acute flare. No red flags. Last flare about 3 months ago. Appears to be worsening in nature. PMH significant for known cervical and lumbar radiculopathy. Past MRI results reviewed with disc protrusions. Will treat his current flare with steroids for 5 days and oxycodone. Increased patient's gabapentin to 600 mg 3 times a day. Encouraged patient that she needs to stay active and do PT exercises at home to help with pain. Can continue conservative measures with heating pad for relief. Patient instructed to hold NSAIDs in setting of steroid use. Would recommend patient be referred for steroid injections versus neurosurgery consult. Patient to follow-up with PCP and appointment has already been made. Handout given and extensive counseling on nature of sciatica and is implications. - oxyCODONE (ROXICODONE) 5 MG immediate release tablet; Take 1 tablet (5 mg total) by mouth every 6 (six) hours as needed  for severe pain.  Dispense: 30 tablet; Refill: 0  PATIENT EDUCATION PROVIDED: See AVS   Luiz Blare, DO 11/18/2016, 8:58 AM PGY-3, [place]

## 2016-12-08 ENCOUNTER — Other Ambulatory Visit: Payer: Self-pay | Admitting: Family Medicine

## 2016-12-08 DIAGNOSIS — M5386 Other specified dorsopathies, lumbar region: Secondary | ICD-10-CM

## 2016-12-08 NOTE — Telephone Encounter (Signed)
Pt is calling for a refill on her Oxycodone 5 mg to be left up front for pick up. Please call when ready. jw

## 2016-12-09 NOTE — Telephone Encounter (Signed)
Please let patient know she will need to be seen by Dr Erin Hearing before any further refills of  oxycodone.

## 2016-12-09 NOTE — Telephone Encounter (Signed)
Patient must be seen by Dr Erin Hearing before refill of oxycodone (12/09/16/TMcDiarmid)

## 2016-12-11 ENCOUNTER — Ambulatory Visit (INDEPENDENT_AMBULATORY_CARE_PROVIDER_SITE_OTHER): Payer: Medicare HMO | Admitting: Obstetrics and Gynecology

## 2016-12-11 ENCOUNTER — Encounter: Payer: Self-pay | Admitting: Obstetrics and Gynecology

## 2016-12-11 VITALS — BP 122/70 | HR 68 | Temp 97.7°F | Wt 188.0 lb

## 2016-12-11 DIAGNOSIS — M5386 Other specified dorsopathies, lumbar region: Secondary | ICD-10-CM

## 2016-12-11 DIAGNOSIS — M539 Dorsopathy, unspecified: Secondary | ICD-10-CM | POA: Diagnosis not present

## 2016-12-11 DIAGNOSIS — M5126 Other intervertebral disc displacement, lumbar region: Secondary | ICD-10-CM | POA: Diagnosis not present

## 2016-12-11 MED ORDER — OXYCODONE HCL 5 MG PO TABS: 5.0000 mg | ORAL_TABLET | Freq: Four times a day (QID) | ORAL | 0 refills | Status: DC | PRN

## 2016-12-11 NOTE — Progress Notes (Signed)
Subjective: Chief Complaint  Patient presents with  . Pain     HPI: Latoya Swanson is a 66 y.o. presenting to clinic today to discuss the following:  #Back Pain Presents for follow-up on back pain. Patient last seen by me on 11/18/2016 for sciatic back pain. She has Risk-standing history of lumbar back pain. At last visit patient was in severe pain and debilitated. She was given prednisone and oxycodone and gabapentin was increased. She states that she has some improvement with this but pain continued to worsen after steroids finished. She feels like her situation has gotten worse. She is now endorsing some associated numbness/weakness on the right. Also having some increased urinary incontinence. Denies any stool incontinence. She is unable to go to the bathroom at night has to use bedside commode. She states that she fell the other day due to weakness and pain. She did not go to the hospital.  No fever, unexplained weight loss, or night pain. Believes she needs surgery or something to relieve the discomfort.   ROS noted in HPI.   Past Medical, Surgical, Social, and Family History Reviewed & Updated per EMR.   Pertinent Historical Findings include:   History  Smoking Status  . Former Smoker  . Types: Cigarettes  . Quit date: 02/20/2013  Smokeless Tobacco  . Never Used    Comment: 2-3 cigs a day    Objective: BP 122/70   Pulse 68   Temp 97.7 F (36.5 C) (Oral)   Wt 188 lb (85.3 kg)   LMP 12/01/2001   SpO2 99%   BMI 32.78 kg/m  Vitals and nursing notes reviewed  Physical Exam Constitutional: Well-developed and nourished. NAD Musculoskeletal: Diffuse tenderness in thoracic and lumbar spine, right buttock, right lateral thigh. Inspection: No erythema or rash of back. Neurological: 1+ patellar reflex on right and 2+ on left. Right strength 4/5 compared to 5/5 on left. She has an abnormal Straight Leg Raise Test. Gait abnormal; walking with walker. Abnormal sensation on  right leg. Rectal tone intact.   Assessment/Plan: 1. Lumbar herniated disc Symtpoms at this time are consistent for herniated lumbar disc. Patient with last MRI in 2016 that was reviewed showing L4-L5 and L5-S1 disc protrusion without neural impingement. Beleive that now she has nerve impingement with new right leg numbness and urinary incontinence. Neuro exam is intact however patient does have slight differences in the right leg. Cauda equina on the differential. Will place urgent referral for spine surgery. Also placed STAT MR imaging of thoracic and lumbar spine. Refilled patient's oxycodone to help with pain. Continue gabapentin 600 3 times a day and baclofen. Has follow-up with PCP in 6 days. Encouraged patient to keep appointment. - Ambulatory referral to Spine Surgery - MR THORACIC SPINE WO CONTRAST; Future - MR Lumbar Spine Wo Contrast; Future  2. Sciatica associated with disorder of lumbar spine The patient has superimposed sciatica along with possible herniated disc. Treatment as above.   PATIENT EDUCATION PROVIDED: See AVS    Diagnosis and plan along with any newly prescribed medication(s) were discussed in detail with this patient today. The patient verbalized understanding and agreed with the plan. Patient advised if symptoms worsen return to clinic or ER.   Precepted case with Dr. Gwendlyn Deutscher  Orders Placed This Encounter  Procedures  . MR THORACIC SPINE WO CONTRAST    Standing Status:   Future    Standing Expiration Date:   02/08/2018    Order Specific Question:  Reason for Exam (SYMPTOM  OR DIAGNOSIS REQUIRED)    Answer:   back pain    Order Specific Question:   Preferred imaging location?    Answer:   [facility] (table limit-350 lbs)    Order Specific Question:   What is the patient's sedation requirement?    Answer:   No Sedation    Order Specific Question:   Does the patient have a pacemaker or implanted devices?    Answer:   No  . MR Lumbar Spine Wo Contrast     Standing Status:   Future    Standing Expiration Date:   02/08/2018    Order Specific Question:   Reason for Exam (SYMPTOM  OR DIAGNOSIS REQUIRED)    Answer:   back pain    Order Specific Question:   Preferred imaging location?    Answer:   [facility] (table limit-350 lbs)    Order Specific Question:   What is the patient's sedation requirement?    Answer:   No Sedation    Order Specific Question:   Does the patient have a pacemaker or implanted devices?    Answer:   No  . Ambulatory referral to Spine Surgery    Referral Priority:   Urgent    Referral Type:   Surgical    Referral Reason:   Specialty Services Required    Requested Specialty:   Neurosurgery    Number of Visits Requested:   1    Meds ordered this encounter  Medications  . oxyCODONE (ROXICODONE) 5 MG immediate release tablet    Sig: Take 1 tablet (5 mg total) by mouth every 6 (six) hours as needed for severe pain.    Dispense:  20 tablet    Refill:  0    Luiz Blare, DO 12/11/2016, 1:45 PM PGY-3, [place]

## 2016-12-11 NOTE — Patient Instructions (Addendum)
Referral placed to go to neurosurgery for back Pain medications given Continue current therapies Keep all appointments  Herniated Disk A herniated disk is when a disk in your spine bulges out too far. There is a disk with a spongy center in between each pair of bones in the spine (vertebrae). These disks act as shock absorbers when you move. A herniated disk can cause pain and muscle weakness. This can happen anywhere in the back or neck. Follow these instructions at home: Medicines  Take over-the-counter and prescription medicines only as told by your doctor.  Do not drive or use heavy machinery while taking prescription pain medicine. Activity  Rest as told by your doctor.  After your rest period:  Return to your normal activities. Slowly start exercising as told by your doctor. Ask what activities are safe for you.  Use good posture.  Avoid movements that cause pain.  Do not lift anything that is heavier than 10 lb (4.5 kg) until your doctor says this is safe.  Do not sit or stand for a [place] time without moving.  Do not sit for a [place] time without getting up and moving around.  Do exercises (physical therapy) as told.  Try to strengthen your back and belly (abdomen) with exercises like crunches, swimming, or walking. General instructions  Do not use any products that contain nicotine or tobacco, such as cigarettes and e-cigarettes. If you need help quitting, ask your doctor.  Do not wear high-heeled shoes.  Do not sleep on your belly.  If you are overweight, work with your doctor to lose weight safely.  To prevent or treat constipation while you are taking prescription pain medicine, your doctor may recommend that you:  Drink enough fluid to keep your pee (urine) clear or pale yellow.  Take over-the-counter or prescription medicines.  Eat foods that are high in fiber. These include fresh fruits and vegetables, whole grains, and beans.  Limit foods that are high  in fat and processed sugars. These include fried and sweet foods.  Keep all follow-up visits as told by your doctor. This is important. How is this prevented?  Stay at a healthy weight.  Try to avoid stress.  Stay in shape. Do at least 150 minutes of moderate-intensity exercise each week, such as fast walking or water aerobics.  When lifting objects:  Keep your feet as far apart as your shoulders (shoulder-width apart) or farther apart.  Tighten your belly muscles.  Bend your knees and hips and keep your spine neutral. Lift using the strength of your legs, not your back. Do not lock your knees straight out.  Always ask for help to lift heavy or awkward objects. Contact a doctor if:  You have back pain or neck pain that does not get better after 6 weeks.  You have very bad pain.  You get any of these problems in any part of your body:  Tingling.  Weakness.  Loss of feeling (numbness). Get help right away if:  You cannot move your arms or legs.  You cannot control when you pee (urinate) or poop (have a bowel movement).  You feel dizzy.  You faint.  You have trouble breathing. This information is not intended to replace advice given to you by your health care provider. Make sure you discuss any questions you have with your health care provider. Document Released: 03/13/2014 Document Revised: 06/25/2016 Document Reviewed: 04/24/2016 Elsevier Interactive Patient Education  2017 [employer].

## 2016-12-12 ENCOUNTER — Ambulatory Visit (HOSPITAL_COMMUNITY): Admission: RE | Admit: 2016-12-12 | Payer: Medicare HMO | Source: Ambulatory Visit

## 2016-12-15 ENCOUNTER — Other Ambulatory Visit: Payer: Self-pay | Admitting: Obstetrics and Gynecology

## 2016-12-17 ENCOUNTER — Ambulatory Visit: Payer: Commercial Managed Care - HMO | Admitting: Family Medicine

## 2016-12-20 ENCOUNTER — Ambulatory Visit (HOSPITAL_COMMUNITY)
Admission: RE | Admit: 2016-12-20 | Discharge: 2016-12-20 | Disposition: A | Discharge status: Home / Self Care | Payer: Medicare HMO | Source: Ambulatory Visit | Attending: Family Medicine | Admitting: Family Medicine

## 2016-12-20 DIAGNOSIS — M5124 Other intervertebral disc displacement, thoracic region: Secondary | ICD-10-CM | POA: Diagnosis not present

## 2016-12-20 DIAGNOSIS — M5126 Other intervertebral disc displacement, lumbar region: Secondary | ICD-10-CM

## 2016-12-20 DIAGNOSIS — M1288 Other specific arthropathies, not elsewhere classified, other specified site: Secondary | ICD-10-CM | POA: Insufficient documentation

## 2016-12-20 DIAGNOSIS — M4807 Spinal stenosis, lumbosacral region: Secondary | ICD-10-CM | POA: Diagnosis not present

## 2016-12-20 DIAGNOSIS — M48061 Spinal stenosis, lumbar region without neurogenic claudication: Secondary | ICD-10-CM | POA: Diagnosis not present

## 2016-12-24 ENCOUNTER — Ambulatory Visit (INDEPENDENT_AMBULATORY_CARE_PROVIDER_SITE_OTHER): Payer: Medicare HMO | Admitting: Family Medicine

## 2016-12-24 ENCOUNTER — Encounter: Payer: Self-pay | Admitting: Family Medicine

## 2016-12-24 DIAGNOSIS — I10 Essential (primary) hypertension: Secondary | ICD-10-CM

## 2016-12-24 DIAGNOSIS — M539 Dorsopathy, unspecified: Secondary | ICD-10-CM

## 2016-12-24 DIAGNOSIS — M5386 Other specified dorsopathies, lumbar region: Secondary | ICD-10-CM

## 2016-12-24 DIAGNOSIS — M5412 Radiculopathy, cervical region: Secondary | ICD-10-CM

## 2016-12-24 MED ORDER — TRAMADOL HCL 50 MG PO TABS: 50.0000 mg | ORAL_TABLET | Freq: Four times a day (QID) | ORAL | 3 refills | Status: DC | PRN

## 2016-12-24 MED ORDER — OXYCODONE HCL 5 MG PO TABS: 5.0000 mg | ORAL_TABLET | Freq: Four times a day (QID) | ORAL | 0 refills | Status: DC | PRN

## 2016-12-24 NOTE — Assessment & Plan Note (Signed)
BP Readings from Last 3 Encounters:  12/24/16 (!) 150/100  12/11/16 122/70  11/18/16 (!) 142/78   Up today but is in significant pain.  Monitor

## 2016-12-24 NOTE — Patient Instructions (Signed)
Good to see you today!  Thanks for coming in.  Increase gabapentin to 900 mg three times a day  See the Neurosurgeon  Take the tramadol and oxycodone as needed for severe pain  Keep active but if you have sudden severe leg weakness or no control of your bladder or bowels god to the ER or call us  Come back in 1 month

## 2016-12-24 NOTE — Progress Notes (Signed)
f °

## 2016-12-24 NOTE — Assessment & Plan Note (Signed)
Worsened.  MRI results not suggestive of specific cause Has NS appointment soon Will increase gabapentin Refilled analgesic for as needed use Hopefully is an exacerbation that will resolve

## 2016-12-24 NOTE — Assessment & Plan Note (Signed)
I think her L hand numbness is related to cervical disease or carpal tunnel.  Will monitor while she is getting work up for back pain

## 2016-12-24 NOTE — Progress Notes (Signed)
Subjective  Patient is presenting with the following illnesses  Back Leg Pain Continues to have pain that starts in her back and radiates to her lower abdomen and sometimes down her leg on the R.  Her leg feels heavy but not really week.  Having urgency of urine and bladder but not incontinence and this has not changed.  Taking hydrocodone for severe pain still has several since last visit and gabapentin 600 three times a day. Rarely use diclofenac No fever or focal painful area.  No nausea and vomiting or change in bowel movements  Left Hand Numbness Has been slowly worsening although does wax and wane.  Hand feels numb especailly at night.  No significant weakenss  HYPERTENSION Disease Monitoring  Home BP Monitoring (Severity) not checking Symptoms - Chest pain- no    Dyspnea- no Medications (Modifying factors) Compliance-  daily. Lightheadedness-  no  Edema- no Timing - continuous  Duration - years ROS - See HPI  PMH Lab Review   Potassium  Date Value Ref Range Status  04/15/2016 3.6 3.5 - 5.3 mmol/L Final   Sodium  Date Value Ref Range Status  04/15/2016 141 135 - 146 mmol/L Final   Creat  Date Value Ref Range Status  04/15/2016 0.66 0.50 - 0.99 mg/dL Final          Chief Complaint noted Review of Symptoms - see HPI PMH - Smoking status noted.     Objective Vital Signs reviewed Alert interactive but tearful when discussing her condition Walks with walker.  Able to sit stand without assistance Able to stand briefly on toes but causes pain Strength 5/5 both upper extrem - grip and elbows Abdomen: soft and non-tender without masses, organomegaly or hernias noted.  No guarding or rebound      Assessments/Plans  No problem-specific Assessment & Plan notes found for this encounter.   See Encounter view if individual problem A/Ps not visible See after visit summary for details of patient instuctions

## 2016-12-26 DIAGNOSIS — M542 Cervicalgia: Secondary | ICD-10-CM | POA: Diagnosis not present

## 2016-12-26 DIAGNOSIS — Z6830 Body mass index (BMI) 30.0-30.9, adult: Secondary | ICD-10-CM | POA: Diagnosis not present

## 2016-12-26 DIAGNOSIS — M545 Low back pain: Secondary | ICD-10-CM | POA: Diagnosis not present

## 2016-12-26 DIAGNOSIS — M546 Pain in thoracic spine: Secondary | ICD-10-CM | POA: Diagnosis not present

## 2017-01-09 ENCOUNTER — Other Ambulatory Visit: Payer: Self-pay | Admitting: Family Medicine

## 2017-01-16 ENCOUNTER — Other Ambulatory Visit: Payer: Self-pay | Admitting: *Deleted

## 2017-01-16 MED ORDER — FLUOXETINE HCL 20 MG PO CAPS: 60.0000 mg | ORAL_CAPSULE | Freq: Every day | ORAL | 0 refills | Status: DC

## 2017-01-16 NOTE — Telephone Encounter (Signed)
Refill request for 90 day supply.  Martin, Tamika L, RN  

## 2017-01-21 ENCOUNTER — Encounter (INDEPENDENT_AMBULATORY_CARE_PROVIDER_SITE_OTHER): Payer: Medicare HMO | Admitting: Licensed Clinical Social Worker

## 2017-01-21 ENCOUNTER — Ambulatory Visit (INDEPENDENT_AMBULATORY_CARE_PROVIDER_SITE_OTHER): Payer: Medicare HMO | Admitting: Family Medicine

## 2017-01-21 ENCOUNTER — Ambulatory Visit: Payer: Medicare HMO

## 2017-01-21 ENCOUNTER — Encounter: Payer: Self-pay | Admitting: Family Medicine

## 2017-01-21 DIAGNOSIS — F3289 Other specified depressive episodes: Secondary | ICD-10-CM

## 2017-01-21 DIAGNOSIS — I10 Essential (primary) hypertension: Secondary | ICD-10-CM

## 2017-01-21 DIAGNOSIS — M539 Dorsopathy, unspecified: Secondary | ICD-10-CM | POA: Diagnosis not present

## 2017-01-21 DIAGNOSIS — M5386 Other specified dorsopathies, lumbar region: Secondary | ICD-10-CM

## 2017-01-21 MED ORDER — OXYCODONE HCL 5 MG PO TABS: 5.0000 mg | ORAL_TABLET | Freq: Four times a day (QID) | ORAL | 0 refills | Status: DC | PRN

## 2017-01-21 NOTE — Progress Notes (Addendum)
Latoya Swanson is a 66 y.o. female  Dr.Chambliss requested Latoya Swanson for the following:  depression. Discussed services offered by Latoya Swanson, verbal consent received.  Pt. reports the following symptoms/concerns decreased appetite, depression, loss of interest in favorite activities, sleep disturbance and crying.:   Duration of current symptoms/ problem: getting worse past 4 ot 5 months Age of onset :on and off for years Impact on function:not wanting to do anything, having difficult adjusting to not being able to do the things she did in the past. Previous out/inpatient treatment:previous treatment with Latoya Swanson, does not want to return. 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 None in past 30 years Medical conditions that might explain or contribute to symptoms:chronic pain  LIFE CONTEXT:  Family & Social: lives with husband of 62 years, grandchildren in & out of her home and has friends.   Life changes: adjusting to physical limitations with medical condition (unable to walk without her walker.   GOALS ADDRESSED:  Getting connected to a Swanson for Latoya Swanson therapy  ASSESSMENT/ RECOMMENDATION :  Pt currently experiencing depression. Symptoms exacerbated by psychosocial stressors of chronic pain and adjusting to her physical limitations.  Pt may benefit from and is in agreement to receive further assessment and therapeutic interventions with Latoya Swanson until she is connected to a Latoya Swanson .  [place] Intervention:Reflective listening, Relaxed breathing   Community Resource and Referral to Counselor/Psychotherapist: patient will call list of providers given and contact Latoya Swanson for group therapy.   PLAN: 1. Patient will F/U with LCSW : in one week 2. LCSW will F/U with patient via phone call in one week if she does not keep appointment 3.  Behavioral Health meds: continue Prozac as prescribed by PCP 4. Patient will work on the following behavioral recommendations: Relaxed breathing, contact [facility] and call Swanson on list provided.   Latoya Lanius, LCSW Licensed Clinical Social Worker Latoya Swanson   [phone] 1:36 PM   Warm Hand Off Completed.

## 2017-01-21 NOTE — Patient Instructions (Signed)
Good to see you today!  Thanks for coming in.  Our behavioral health specialist will see you today and make plans for further counseling  Come back in 1 month

## 2017-01-21 NOTE — Assessment & Plan Note (Signed)
Improved.  Continue current medications  °

## 2017-01-21 NOTE — Assessment & Plan Note (Signed)
Worsened likely due to pain.  Will ask IC to see and possibly arrange follow up

## 2017-01-21 NOTE — Progress Notes (Signed)
Subjective  Patient is presenting with the following illnesses  Back Pain Continued since last visit with little relief.  Pain is primarily on right lower back with radiation to right leg and groin.  Worse with certain movements.  No nausea and vomiting or change in bowel movement. No incontinence except usual mild urinary incontinence.  Legs are painful but no weakness.  Taking all medications as precribed.  Last narcotic several days ago.  Her scheduled injection was postponed due to snow  HYPERTENSION Disease Monitoring  Home BP Monitoring (Severity) not cheking Symptoms - Chest pain- no    Dyspnea- no Medications (Modifying factors) Compliance-  daily. Lightheadedness-  no  Edema- no Timing - continuous  Duration - years ROS - See HPI   Depression Feels is worsening due to chronic pain.  Feelings of dread and anxiety.  No suicidal ideation.  Taking prozac regularly.  Has seen counselor in past and would consider now.    PMH Lab Review   Potassium  Date Value Ref Range Status  04/15/2016 3.6 3.5 - 5.3 mmol/L Final   Sodium  Date Value Ref Range Status  04/15/2016 141 135 - 146 mmol/L Final   Creat  Date Value Ref Range Status  04/15/2016 0.66 0.50 - 0.99 mg/dL Final      Chief Complaint noted Review of Symptoms - see HPI PMH - Smoking status noted.     Objective Vital Signs reviewed Able to stand on heels and toes and do slight knee bend with pain but not weakness holding to walker Abdomen: soft and non-tender without masses, organomegaly or hernias noted.  No guarding or rebound     Assessments/Plans  No problem-specific Assessment & Plan notes found for this encounter.   See Encounter view if individual problem A/Ps not visible See after visit summary for details of patient instuctions

## 2017-01-21 NOTE — Assessment & Plan Note (Signed)
No improvement.  Continue current medications Is scheduled for an injection and follow up

## 2017-01-26 ENCOUNTER — Other Ambulatory Visit: Payer: Self-pay | Admitting: *Deleted

## 2017-01-26 MED ORDER — GABAPENTIN 600 MG PO TABS: 900.0000 mg | ORAL_TABLET | Freq: Three times a day (TID) | ORAL | 4 refills | Status: DC

## 2017-01-26 NOTE — Telephone Encounter (Signed)
Refill request for 90 day supply.  Martin, Tamika L, RN  

## 2017-01-28 ENCOUNTER — Ambulatory Visit: Payer: Medicare HMO

## 2017-01-29 ENCOUNTER — Ambulatory Visit: Payer: Medicare HMO

## 2017-02-02 ENCOUNTER — Telehealth: Payer: Self-pay | Admitting: Licensed Clinical Social Worker

## 2017-02-02 NOTE — Progress Notes (Signed)
Patient did not show for [facility] appointment 01/28/17, follow up call to patient.   Phone rang several times, would not allow LCSW to leave a voice message.   Plan: LCSW will call again in 5 to 7  business days  Casimer Lanius, LCSW Licensed Clinical Social Worker [place]   [phone] 10:55 AM

## 2017-02-09 ENCOUNTER — Other Ambulatory Visit: Payer: Self-pay | Admitting: Family Medicine

## 2017-02-11 ENCOUNTER — Ambulatory Visit: Payer: Medicare HMO

## 2017-02-17 ENCOUNTER — Other Ambulatory Visit: Payer: Self-pay | Admitting: Family Medicine

## 2017-02-17 ENCOUNTER — Telehealth: Payer: Self-pay | Admitting: Licensed Clinical Social Worker

## 2017-02-17 NOTE — Progress Notes (Signed)
[facility] F/U call to patient.  Patient reports Family stressors and recent death in family.  Unable to f/u on recommendations provided by LCSW. LCSW reminded patient of [facility] services and provided emotional support. Patient appreciative of the f/u call, would like to talk with LCSW after her office visit with Dr. Erin Hearing.   Plan: LCSW will meet with patient after PCP office visit on 02/25/17  Casimer Lanius, LCSW Licensed Clinical Social Worker [place]   [phone] 11:44 AM

## 2017-02-18 ENCOUNTER — Other Ambulatory Visit: Payer: Self-pay | Admitting: Family Medicine

## 2017-02-20 NOTE — Telephone Encounter (Signed)
Second request. L. Takyia Sindt, RN, BSN   

## 2017-02-25 ENCOUNTER — Encounter: Payer: Self-pay | Admitting: Family Medicine

## 2017-02-25 ENCOUNTER — Encounter: Payer: Self-pay | Admitting: Licensed Clinical Social Worker

## 2017-02-25 ENCOUNTER — Ambulatory Visit (INDEPENDENT_AMBULATORY_CARE_PROVIDER_SITE_OTHER): Payer: Medicare HMO | Admitting: Family Medicine

## 2017-02-25 VITALS — BP 146/78 | HR 69 | Temp 97.8°F | Wt 186.6 lb

## 2017-02-25 DIAGNOSIS — M539 Dorsopathy, unspecified: Secondary | ICD-10-CM

## 2017-02-25 DIAGNOSIS — M5386 Other specified dorsopathies, lumbar region: Secondary | ICD-10-CM

## 2017-02-25 DIAGNOSIS — Z13820 Encounter for screening for osteoporosis: Secondary | ICD-10-CM

## 2017-02-25 DIAGNOSIS — Z720 Tobacco use: Secondary | ICD-10-CM

## 2017-02-25 DIAGNOSIS — I10 Essential (primary) hypertension: Secondary | ICD-10-CM

## 2017-02-25 DIAGNOSIS — F3289 Other specified depressive episodes: Secondary | ICD-10-CM | POA: Diagnosis not present

## 2017-02-25 MED ORDER — OXYCODONE HCL 5 MG PO TABS: 5.0000 mg | ORAL_TABLET | Freq: Four times a day (QID) | ORAL | 0 refills | Status: DC | PRN

## 2017-02-25 NOTE — Patient Instructions (Addendum)
Good to see you today!  Thanks for coming in.  I will order the Physical therapy and the bone density  I will call you if your tests are not good.  Otherwise I will send you a letter.  If you do not hear from me with in 2 weeks please call our office.     The oxycodone should last 6 weeks   Come back in 6 weeks or sooner if worsening

## 2017-02-25 NOTE — Progress Notes (Signed)
Subjective  Patient is presenting with the following illnesses  Back Pain About the same.  Has not had injections is deciding not to and wants to try Physical Therapy No worsened lower weakness or incontinence.  Still taking gabapentin and as needed oxycodone  Depression - see IC note   SMOKING - smoking more due to stress.  Is wondering about Chantix.   Some coughing and she is concerned aobut the Solomon term problems.   Chief Complaint noted Review of Symptoms - see HPI PMH - Smoking status noted.     Objective Vital Signs reviewed Walking with walker Psych:  Cognition and judgment appear intact. Alert, communicative  and cooperative with normal attention span and concentration. No apparent delusions, illusions, hallucinations     Assessments/Plans  No problem-specific Assessment & Plan notes found for this encounter.   See Encounter view if individual problem A/Ps not visible See after visit summary for details of patient instuctions

## 2017-02-25 NOTE — Assessment & Plan Note (Signed)
Worsening.  See after visit summary.  Do not thnk Chantix given current stressors would be safe

## 2017-02-25 NOTE — Progress Notes (Addendum)
Integrated Behavioral Health Follow Up Visit  MRN: [id number] Name: Latoya Swanson  Total time: 30 minutes Number of [place] Clinician visits: 1/10  Type of Service: [place] Interpretor:No. Interpretor Name and Language: NA  SUBJECTIVE: Latoya Swanson is a 66 y.o. female referred by Dr.Chambliss for assistance with managing symptoms of depression Patient reports the following symptoms/concerns: crying spells however they have improved and are not daily, eating more due to stress with great grandchildren, impaired sleeping however patient reports she is sleeping about 6 hrs per night. Duration of problem: continues for past several months; Severity of problem: mild to moderate. Has not impacted her daily functions. Patient has started smoking again and would like to stop. States she will discuss this with PCP.    OBJECTIVE: Mood: Euthymic and Affect: Appropriate and Tearful at times when talking about wanting to face her trauma. Risk of harm to self or others: No plan to harm self or others  LIFE CONTEXT: Family and Social: continues to live with husband School/Work: on disabilty Self-Care: reports started reading, journaling, drinking more water, and continues with her relaxed breathing Life Changes: recent death in the family, birth of premature twin great grandbabies, place family member in facility and started smoking.  GOALS ADDRESSED: Patient will reduce symptoms of: depression, insomnia and stress and increase knowledge of: healthy habits, self-management skills and stress reduction  also: Increase healthy adjustment to current life circumstances. Related to limitations with her health.    INTERVENTIONS: Solution-Focused Strategies and Link to [employer] Standardized Assessments completed: PHQ 2  ASSESSMENT: Patient currently experiencing minimal to moderate symptoms of depression, related to life transitions and  wanting to face the trauma from her childhood and early adulthood. Patient may benefit from and is in agreement to seek further assessment and threrapy for ongoing interventions.  PLAN: 1. Follow up with LCSW during next office visit with PCP 2. Behavioral recommendations: continue reading, journaling, writing poetry and relaxed breathing,  3. Referral(s): Armed forces logistics/support/administrative officer (LME/Outside Clinic) and Counselor ; list of providers given 4.   LCSW will F/U call in 3 to 5 business days.  [place], [place] Licensed Clinical Social Worker [facility]   [phone] 12:50 PM Latoya Cane, LCSW

## 2017-02-25 NOTE — Assessment & Plan Note (Signed)
Will follow up with her psychiatrist and given information for counseling

## 2017-02-25 NOTE — Assessment & Plan Note (Signed)
Stable.  Will refer to Physical Therapy and treat mental health disorders

## 2017-02-26 ENCOUNTER — Encounter: Payer: Self-pay | Admitting: Family Medicine

## 2017-02-26 LAB — COMPREHENSIVE METABOLIC PANEL
A/G RATIO: 1.6 (ref 1.2–2.2)
ALBUMIN: 4.9 g/dL — AB (ref 3.6–4.8)
ALK PHOS: 141 IU/L — AB (ref 39–117)
ALT: 24 IU/L (ref 0–32)
AST: 26 IU/L (ref 0–40)
BUN / CREAT RATIO: 27 (ref 12–28)
BUN: 17 mg/dL (ref 8–27)
Bilirubin Total: 0.6 mg/dL (ref 0.0–1.2)
CALCIUM: 10 mg/dL (ref 8.7–10.3)
CO2: 20 mmol/L (ref 18–29)
CREATININE: 0.64 mg/dL (ref 0.57–1.00)
Chloride: 103 mmol/L (ref 96–106)
GFR calc Af Amer: 108 mL/min/{1.73_m2} (ref >59)
GFR, EST NON AFRICAN AMERICAN: 94 mL/min/{1.73_m2} (ref >59)
GLOBULIN, TOTAL: 3 g/dL (ref 1.5–4.5)
Glucose: 101 mg/dL — ABNORMAL HIGH (ref 65–99)
POTASSIUM: 3.7 mmol/L (ref 3.5–5.2)
SODIUM: 141 mmol/L (ref 134–144)
Total Protein: 7.9 g/dL (ref 6.0–8.5)

## 2017-03-04 ENCOUNTER — Telehealth: Payer: Self-pay | Admitting: Licensed Clinical Social Worker

## 2017-03-04 NOTE — Progress Notes (Signed)
Late entry for 02/27/17--[facility] F/U call:  Patient did not follow plan to contact counselor for ongoing therapy and requested that LCSW call back next week to give her more time to complete this item.  Plan: LCSW will call back in 3 to 5 days    2nd F/U call to patient 03/04/17 to review her behavioral health plan.  Left message to call LCSW. Plan:  LCSW will wait for return phone call   Casimer Lanius, LCSW Licensed Clinical Social Worker [place]   [phone] 3:30 PM

## 2017-03-24 ENCOUNTER — Other Ambulatory Visit: Payer: Self-pay | Admitting: Family Medicine

## 2017-04-08 ENCOUNTER — Ambulatory Visit (INDEPENDENT_AMBULATORY_CARE_PROVIDER_SITE_OTHER): Payer: Medicare HMO | Admitting: Family Medicine

## 2017-04-08 ENCOUNTER — Encounter: Payer: Self-pay | Admitting: Family Medicine

## 2017-04-08 DIAGNOSIS — M539 Dorsopathy, unspecified: Secondary | ICD-10-CM | POA: Diagnosis not present

## 2017-04-08 DIAGNOSIS — M5386 Other specified dorsopathies, lumbar region: Secondary | ICD-10-CM

## 2017-04-08 DIAGNOSIS — F3289 Other specified depressive episodes: Secondary | ICD-10-CM

## 2017-04-08 DIAGNOSIS — G5602 Carpal tunnel syndrome, left upper limb: Secondary | ICD-10-CM

## 2017-04-08 MED ORDER — BACLOFEN 10 MG PO TABS: ORAL_TABLET | ORAL | 1 refills | Status: DC

## 2017-04-08 MED ORDER — OXYCODONE HCL 5 MG PO TABS: 5.0000 mg | ORAL_TABLET | Freq: Four times a day (QID) | ORAL | 0 refills | Status: DC | PRN

## 2017-04-08 NOTE — Patient Instructions (Signed)
Good to see you today!  Thanks for coming in.  I will send in a referral for [facility]  Schedule your colonoscopy  Schedule your mammogram  Go back to Physical Therapy  Set up an eye doctor appointment

## 2017-04-08 NOTE — Progress Notes (Signed)
Subjective  Patient is presenting with the following illnesses  Back Pain Improved with Physical Therapy  Still taking gabapentin and as needed oxycodone (20 lasted more than 40 days) Using walker most of the time.  No new lower extremity weakness or incontinence  Depression -feeling better.  Is seeing a therapist and psychiatrist.  No suicidal ideation or excessive sleeping or change in appetite   Carpal Tunnel Having pain and numbness in left hand like had when had CT in the right hand before her release.  No weakness of hand or arm or loss of sensation above wrist  Chief Complaint noted Review of Symptoms - see HPI PMH - Smoking status noted.     Objective Vital Signs reviewed Psych:  Cognition and judgment appear intact. Alert, communicative  and cooperative with normal attention span and concentration. No apparent delusions, illusions, hallucinations     Assessments/Plans  No problem-specific Assessment & Plan notes found for this encounter.   See Encounter view if individual problem A/Ps not visible See after visit summary for details of patient instuctions

## 2017-04-09 NOTE — Assessment & Plan Note (Signed)
Improving.  Continue Physical Therapy and current medications

## 2017-04-09 NOTE — Assessment & Plan Note (Signed)
Worsened.  Will refer for surgery.

## 2017-04-09 NOTE — Assessment & Plan Note (Signed)
Improving Continue current therapy

## 2017-04-14 ENCOUNTER — Ambulatory Visit (INDEPENDENT_AMBULATORY_CARE_PROVIDER_SITE_OTHER): Payer: Commercial Managed Care - HMO | Admitting: Orthopaedic Surgery

## 2017-04-15 DIAGNOSIS — Z803 Family history of malignant neoplasm of breast: Secondary | ICD-10-CM | POA: Diagnosis not present

## 2017-04-15 DIAGNOSIS — Z1231 Encounter for screening mammogram for malignant neoplasm of breast: Secondary | ICD-10-CM | POA: Diagnosis not present

## 2017-04-20 ENCOUNTER — Other Ambulatory Visit: Payer: Self-pay | Admitting: Family Medicine

## 2017-04-21 ENCOUNTER — Ambulatory Visit (INDEPENDENT_AMBULATORY_CARE_PROVIDER_SITE_OTHER): Payer: Medicare HMO

## 2017-04-21 ENCOUNTER — Encounter (INDEPENDENT_AMBULATORY_CARE_PROVIDER_SITE_OTHER): Payer: Self-pay | Admitting: Orthopaedic Surgery

## 2017-04-21 ENCOUNTER — Ambulatory Visit (INDEPENDENT_AMBULATORY_CARE_PROVIDER_SITE_OTHER): Payer: Medicare HMO | Admitting: Orthopaedic Surgery

## 2017-04-21 DIAGNOSIS — M25532 Pain in left wrist: Secondary | ICD-10-CM

## 2017-04-21 NOTE — Addendum Note (Signed)
Addended by: Precious Bard on: 04/21/2017 12:36 PM   Modules accepted: Orders

## 2017-04-21 NOTE — Progress Notes (Signed)
Office Visit Note   Patient: Latoya Swanson           Date of Birth: 27-Jan-1951           MRN: [id number] Visit Date: 04/21/2017              Requested by: [name], MD [place], [place] [id number] PCP: [name], MD   Assessment & Plan: Visit Diagnoses:  1. Pain in left wrist     Plan: Overall impression is recurrent left carpal tunnel syndrome. Carpal tunnel splint was provided today. Nerve conduction study ordered. Follow-up after study.  Follow-Up Instructions: Return if symptoms worsen or fail to improve.   Orders:  Orders Placed This Encounter  Procedures  . XR Wrist Complete Left   No orders of the defined types were placed in this encounter.     Procedures: No procedures performed   Clinical Data: No additional findings.   Subjective: Chief Complaint  Patient presents with  . Left Wrist - Pain    Patient is 66 year old female with left hand and wrist pain reminiscent of carpal tunnel syndrome.  She has had bilateral carpal tunnel releases of the hand center. She did get good relief but in the last year she has had worsening symptoms in the left hand. She is experiencing pain, numbness, burning. She feels she needs to shake out her hand and the rubber hand. She does endorse right-sided neck pain. She denies any true radicular symptoms.    Review of Systems  Constitutional: Negative.   HENT: Negative.   Eyes: Negative.   Respiratory: Negative.   Cardiovascular: Negative.   Endocrine: Negative.   Musculoskeletal: Negative.   Neurological: Negative.   Hematological: Negative.   Psychiatric/Behavioral: Negative.   All other systems reviewed and are negative.    Objective: Vital Signs: LMP 12/01/2001   Physical Exam  Constitutional: She is oriented to person, place, and time. She appears well-developed and well-nourished.  HENT:  Head: Normocephalic and atraumatic.  Eyes: EOM are normal.  Neck: Neck  supple.  Pulmonary/Chest: Effort normal.  Abdominal: Soft.  Neurological: She is alert and oriented to person, place, and time.  Skin: Skin is warm. Capillary refill takes less than 2 seconds.  Psychiatric: She has a normal mood and affect. Her behavior is normal. Judgment and thought content normal.  Nursing note and vitals reviewed.   Ortho Exam Left hand exam shows a fully healed surgical scar. No muscular atrophy or flattening. She has positive carpal tunnel compression signs. Hand is neurovascularly intact. Specialty Comments:  No specialty comments available.  Imaging: No results found.   PMFS History: Patient Active Problem List   Diagnosis Date Noted  . Sciatica associated with disorder of lumbar spine 09/01/2016  . Abnormal gall bladder diagnostic imaging 10/24/2015  . 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 Hepatic cirrhosis ([place]) 07/18/2015  . Tobacco use 03/03/2012  . 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 Cervical radiculitis 06/29/2009  . OVERWEIGHT 03/08/2009  . CARPAL TUNNEL SYNDROME, LEFT 07/13/2008  . Depression 07/05/2007  . Essential hypertension 07/05/2007  . GERD 07/05/2007  . HYPERCHOLESTEROLEMIA, MILD 03/05/2007   Past Medical History:  Diagnosis Date  . Alcoholism ([place])   . Anxiety   . Arthritis   . Cirrhosis ([place])   . Colon polyps   . Depression   . GERD (gastroesophageal reflux disease)   . Hepatitis C    treated and cured  . History of bleeding peptic ulcer   .  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  [place] Kitchen Hypertension   . Normal cardiac stress test 2008    [name]  . Normal echocardiogram 08/19/2007    Family History  Problem Relation Age of Onset  . Coronary artery disease Father        died age 41  . Breast cancer Mother        died age 23  . Colon cancer Mother   . Stomach cancer Father   . Arthritis Father   . Arthritis Mother   . Arthritis Maternal Grandmother   . Arthritis Paternal Grandmother     . Alzheimer's disease Maternal Aunt        x5  . Alzheimer's disease Mother   . Breast cancer Paternal Grandmother   . Breast cancer Other        PGA    Past Surgical History:  Procedure Laterality Date  . ANAL FISSURE REPAIR    . CARPAL TUNNEL RELEASE Bilateral   . DILATION AND CURETTAGE OF UTERUS     ectopic pregnancy  . ROTATOR CUFF REPAIR Right    x 2  . TUBAL LIGATION     Social History   Occupational History  . DISABLED Unemployed   Social History Main Topics  . Smoking status: Former Smoker    Types: Cigarettes    Quit date: 02/20/2013  . Smokeless tobacco: Never Used     Comment: 2-3 cigs a day  . Alcohol use 0.0 oz/week     Comment: wine ocassional  . Drug use: No  . Sexual activity: Yes    Birth control/ protection: Post-menopausal

## 2017-04-28 ENCOUNTER — Telehealth: Payer: Self-pay | Admitting: Family Medicine

## 2017-04-28 NOTE — Telephone Encounter (Signed)
Have you scheduled your colonoscopy or mammogram? She has not scheduled a colonoscopy but has had her mammogram.  Have you gone back to Physical Therapy? No, but plans on setting it up after getting her hand better.  Have you set up an eye doctor appointment? No, she forgot but plans on setting one up.  - Latoya Swanson

## 2017-04-30 ENCOUNTER — Encounter: Payer: Self-pay | Admitting: Family Medicine

## 2017-05-15 ENCOUNTER — Other Ambulatory Visit: Payer: Self-pay | Admitting: *Deleted

## 2017-05-15 MED ORDER — BACLOFEN 10 MG PO TABS: ORAL_TABLET | ORAL | 1 refills | Status: DC

## 2017-05-20 ENCOUNTER — Encounter (INDEPENDENT_AMBULATORY_CARE_PROVIDER_SITE_OTHER): Payer: Medicare HMO | Admitting: Physical Medicine and Rehabilitation

## 2017-05-27 ENCOUNTER — Other Ambulatory Visit: Payer: Self-pay | Admitting: *Deleted

## 2017-05-27 MED ORDER — BACLOFEN 10 MG PO TABS: ORAL_TABLET | ORAL | 2 refills | Status: DC

## 2017-05-27 NOTE — Telephone Encounter (Signed)
Refill request for 90 day supply.  Martin, Tamika L, RN  

## 2017-06-10 ENCOUNTER — Other Ambulatory Visit: Payer: Self-pay | Admitting: Family Medicine

## 2017-06-11 ENCOUNTER — Other Ambulatory Visit: Payer: Self-pay | Admitting: Family Medicine

## 2017-07-31 ENCOUNTER — Encounter: Payer: Self-pay | Admitting: Family Medicine

## 2017-07-31 ENCOUNTER — Ambulatory Visit (INDEPENDENT_AMBULATORY_CARE_PROVIDER_SITE_OTHER): Payer: Medicare HMO | Admitting: Family Medicine

## 2017-07-31 VITALS — BP 140/90 | HR 71 | Temp 98.3°F | Ht 64.0 in | Wt 177.0 lb

## 2017-07-31 DIAGNOSIS — M539 Dorsopathy, unspecified: Secondary | ICD-10-CM

## 2017-07-31 DIAGNOSIS — M25552 Pain in left hip: Secondary | ICD-10-CM

## 2017-07-31 DIAGNOSIS — M5386 Other specified dorsopathies, lumbar region: Secondary | ICD-10-CM

## 2017-07-31 DIAGNOSIS — Z23 Encounter for immunization: Secondary | ICD-10-CM

## 2017-07-31 MED ORDER — KETOROLAC TROMETHAMINE 60 MG/2ML IM SOLN
60.0000 mg | Freq: Once | INTRAMUSCULAR | Status: AC
  Administered 2017-07-31: 60 mg via INTRAMUSCULAR

## 2017-07-31 MED ORDER — METHYLPREDNISOLONE ACETATE 80 MG/ML IJ SUSP
80.0000 mg | Freq: Once | INTRAMUSCULAR | Status: AC
  Administered 2017-07-31: 80 mg via INTRAMUSCULAR

## 2017-07-31 NOTE — Patient Instructions (Signed)
Myrth, you were seen today for right lower back pain that radiates down to your right foot. At this point you are taking 2700 mg of gabapentin. There is definitely room to move up. I will start taking 2 pills 3 times a day. This will be a total of 3600 mg.   I also gave you a steroid shot and a pain shot today.   Please follow-up in one month.  Napolean Sia L. Rosalyn Gess, [place] Resident PGY-2 07/31/2017 11:48 AM

## 2017-07-31 NOTE — Progress Notes (Signed)
Sciatic pain

## 2017-08-03 ENCOUNTER — Other Ambulatory Visit: Payer: Self-pay | Admitting: Family Medicine

## 2017-08-03 NOTE — Assessment & Plan Note (Addendum)
Presentation consistent with flair of R sided sciatica without red flags.  She is requesting refill of oxycodone, but she is only on 1800mg  of gabapentin and has room to increase with max being 3600mg . I discussed with her that increasing gabapentin will likely be far more effective than oxycodone and she is in agreement. Tomorrow she will begin gabapentin 600mg  2 pills TID.  In addition I gave her a shot of solumedrol 80mg  and toradol 60mg  for acute pain relief. I discussed return precautions. I have asked her to come back in one month.  Will try dosepak if she continues with pain and will consider repeat imaging.

## 2017-08-03 NOTE — Telephone Encounter (Signed)
Please call in Tramadol Thanks!

## 2017-08-03 NOTE — Progress Notes (Signed)
    Subjective:  Latoya Swanson is a 66 y.o. female who presents to the [facility] today with a chief complaint of worsening radiating lower back pain  HPI:  Back pain, chronic: Latoya Swanson has a history of R lower back pain that radiates to the right leg and down to her foot. She reports that the pain is worse with certain movements. She denies any fevers, chills or night pain.  No urinary or bowel incontinence. No subjective weakness. States that this feels like one of her flares. She is specifically asking for oxycodone today.  Pain is worse with sitting on the right side of her buttock or lying on her right side.   PMH: lumbar facet arthropathy from L3-S1 and disc bulge with bilateral recess narrowing at L3-L5 and mod bilateral lumbar foraminal stenosis L5-S1 Tobacco use: former smoker Medication: reviewed and updated ROS: see HPI   Objective:  Physical Exam: BP 140/90   Pulse 71   Temp 98.3 F (36.8 C) (Oral)   Ht 5\' 4"  (1.626 m)   Wt 177 lb (80.3 kg)   LMP 12/01/2001   SpO2 98%   BMI 30.38 kg/m   Gen: 66yo F appearing uncomfortable, but in NAD CV: RRR with no murmurs appreciated Pulm: NWOB, CTAB with no crackles, wheezes, or rhonchi GI: Normal bowel sounds present. Soft, Nontender, Nondistended. MSK: no gross deformities or edema. Trunk ROM limited 2/2 pain.  TTP at bilateral greater trochanters and mild spinal tenderness at L3-S1 as well as paraspinal tenderness in these regions. She has positive leg raise bilaterally R>L. FABER/FADIR difficult to assess d/t generalized pain.  Neuro: grossly normal, strength 5/5 in lower extremities, reflexes 1+ bilaterally at patellar and achilles   No results found for this or any previous visit (from the past 72 hour(s)).   Assessment/Plan:  Sciatica associated with disorder of lumbar spine Presentation consistent with flair of R sided sciatica without red flags.  She is requesting refill of oxycodone, but she is only on 1800mg  of gabapentin and  has room to increase with max being 3600mg . I discussed with her that increasing gabapentin will likely be far more effective than oxycodone and she is in agreement. Tomorrow she will begin gabapentin 600mg  2 pills TID.  In addition I gave her a shot of solumedrol 80mg  and toradol 60mg  for acute pain relief. I discussed return precautions. I have asked her to come back in one month.  Will try dosepak if she continues with pain and will consider repeat imaging.

## 2017-08-04 NOTE — Telephone Encounter (Signed)
Rx called into [facility].

## 2017-08-06 ENCOUNTER — Other Ambulatory Visit: Payer: Self-pay | Admitting: Family Medicine

## 2017-08-06 ENCOUNTER — Other Ambulatory Visit: Payer: Self-pay | Admitting: *Deleted

## 2017-08-06 MED ORDER — FLUOXETINE HCL 20 MG PO CAPS: 60.0000 mg | ORAL_CAPSULE | Freq: Every day | ORAL | 3 refills | Status: DC

## 2017-08-06 NOTE — Telephone Encounter (Signed)
Refill request for 90 day supply.  Martin, Tamika L, RN  

## 2017-08-08 ENCOUNTER — Other Ambulatory Visit: Payer: Self-pay | Admitting: Family Medicine

## 2017-08-11 ENCOUNTER — Other Ambulatory Visit: Payer: Self-pay | Admitting: *Deleted

## 2017-08-12 MED ORDER — DICLOFENAC SODIUM 75 MG PO TBEC: 75.0000 mg | DELAYED_RELEASE_TABLET | Freq: Two times a day (BID) | ORAL | 3 refills | Status: DC | PRN

## 2017-08-19 ENCOUNTER — Ambulatory Visit (INDEPENDENT_AMBULATORY_CARE_PROVIDER_SITE_OTHER): Payer: Medicare HMO | Admitting: Family Medicine

## 2017-08-19 ENCOUNTER — Encounter: Payer: Self-pay | Admitting: Family Medicine

## 2017-08-19 DIAGNOSIS — F3289 Other specified depressive episodes: Secondary | ICD-10-CM

## 2017-08-19 DIAGNOSIS — I10 Essential (primary) hypertension: Secondary | ICD-10-CM | POA: Diagnosis not present

## 2017-08-19 DIAGNOSIS — M5386 Other specified dorsopathies, lumbar region: Secondary | ICD-10-CM

## 2017-08-19 MED ORDER — OMEPRAZOLE 20 MG PO CPDR: DELAYED_RELEASE_CAPSULE | ORAL | 2 refills | Status: DC

## 2017-08-19 MED ORDER — OXYCODONE HCL 5 MG PO TABS: 5.0000 mg | ORAL_TABLET | Freq: Four times a day (QID) | ORAL | 0 refills | Status: DC | PRN

## 2017-08-19 MED ORDER — TRIAMCINOLONE ACETONIDE 0.1 % EX CREA: 1.0000 "application " | TOPICAL_CREAM | Freq: Two times a day (BID) | CUTANEOUS | 1 refills | Status: DC

## 2017-08-19 NOTE — Assessment & Plan Note (Signed)
BP Readings from Last 3 Encounters:  08/19/17 (!) 148/82  07/31/17 140/90  04/08/17 130/72   Usually at goal increased today perhaps due to pain continue current regimen

## 2017-08-19 NOTE — Assessment & Plan Note (Signed)
Stable.  Using narcotics as needed appropriately.  Refilled today

## 2017-08-19 NOTE — Patient Instructions (Addendum)
Good to see you today!  Thanks for coming in.  For Good days with pain - try to take as little as possible on bad days take as needed  Decrease the prozac to 2 tabs a day = 40 mg  Call [facility] and see if you are due for a colonoscopy   If the bites seem to get more red or any pus then call us  Come back in 3 months

## 2017-08-19 NOTE — Progress Notes (Signed)
Subjective  Patient is presenting with the following illnesses  BACK PAIN Continues to have intermittent flares of pain in primarly left side neck and lower back.  Uses a combination of analgesics and trys to keep active.  (has stopped using a walker and cane)  Would like to have narcotic on hand in case of flare.  Has used appropriately in past.  No weakness or incontinence  DEPRESSION Feels this is going well.  Was off her prozac for a week without problems.  Enjoys taking care of her grandkids  HYPERTENSION Disease Monitoring  Home BP Monitoring (Severity) not checking Symptoms - Chest pain- no    Dyspnea- no Medications (Modifying factors) Compliance-  daily. Lightheadedness-  no  Edema- no Timing - continuous  Duration - years ROS - See HPI  PMH Lab Review   Potassium  Date Value Ref Range Status  02/25/2017 3.7 3.5 - 5.2 mmol/L Final    Comment:    Specimen received in contact with cells. No visible hemolysis present. However GLUC may be decreased and K increased. Clinical correlation indicated.    Sodium  Date Value Ref Range Status  02/25/2017 141 134 - 144 mmol/L Final   Creat  Date Value Ref Range Status  04/15/2016 0.66 0.50 - 0.99 mg/dL Final   Creatinine, Ser  Date Value Ref Range Status  02/25/2017 0.64 0.57 - 1.00 mg/dL Final           Chief Complaint noted Review of Symptoms - see HPI PMH - Smoking status noted.     Objective Vital Signs reviewed Walking with slight limp     Assessments/Plans  Depression Improved  Will taper prozac   Essential hypertension BP Readings from Last 3 Encounters:  08/19/17 (!) 148/82  07/31/17 140/90  04/08/17 130/72   Usually at goal increased today perhaps due to pain continue current regimen   Sciatica associated with disorder of lumbar spine Stable.  Using narcotics as needed appropriately.  Refilled today    See after visit summary for details of patient instuctions

## 2017-08-19 NOTE — Assessment & Plan Note (Signed)
Improved  Will taper prozac

## 2017-08-24 ENCOUNTER — Telehealth: Payer: Self-pay | Admitting: *Deleted

## 2017-08-24 NOTE — Telephone Encounter (Signed)
Patient states that the blisters have become more "blister like" and Dr. Erin Hearing told her to call if they did. Darcee Dekker, Salome Spotted, CMA

## 2017-08-25 NOTE — Telephone Encounter (Signed)
Called home number no answer  Called cell no vm set up

## 2017-09-13 NOTE — Progress Notes (Signed)
Subjective:    Patient ID: Latoya Swanson, female    DOB: 05-Oct-1951, 66 y.o.   MRN: [id number]   CC: Chronic Back/Neck/Shoulder pain  HPI: Chronic Pain Indication for chronic opioid: DJD, Sciatica, cervical radiculitis  Medication and dose: baclofen 10mg  daily, voltaren 75mg  bid, gabapentine 900 mg tid, oxycodone 5mg  q6hrs prn, tramadol 50mg  q6hrs prn  Last UDS date: none  Date narcotic database last reviewed (include red flags): 09/14/17, no red flats   Patient today states pain is well controlled with current regimen. States that she still has 3 oxycodone pills left from previous 20 prescribed on 08/19/17 by Dr. Erin Hearing. States that she is going on vacation for 1 month for her granddaughter's college graduation and will not be able to come back for follow up to receive a new prescription of oxycodone in that time.   Rash on hand Patient today stating she is fearful she has a blood infection in her hand. States that her left hand has been swollen and has fluid filled blisters. States that on 08/12/17 she purchased a used recliner which had bed bugs. Patient cleaned recliner and called exterminator on 08/13/17 to get rid of bed bugs. Patient states she has not tried any medications but has used triamcinolone 0.1% with no relief. States she has been scratching, with marked excoriations. Denies fever, chills, nausea, vomiting, or diarrhea. States she has contact with many great grandchildren daily and also helps care for elderly neighbors. No one else she is in contact with is sick or has rashes. Patient's husband has had no rash.    Objective:  BP 138/80   Pulse 79   Temp 98.4 F (36.9 C) (Oral)   Ht 5\' 4"  (1.626 m)   Wt 175 lb 9.6 oz (79.7 kg)   LMP 12/01/2001   SpO2 98%   BMI 30.14 kg/m  Vitals and nursing note reviewed  General: well nourished, in no acute distress HEENT: normocephalic Cardiac: RRR, clear S1 and S2, no murmurs, rubs, or gallops Respiratory: clear to auscultation  bilaterally, no increased work of breathing Abdomen: soft, nontender, nondistended, no masses or organomegaly. Bowel sounds present Extremities: no edema or cyanosis. Warm, well perfused.  Skin: one vesicle noted on right foot, scarring from previous vesicles noted throughout body, excoriations on hand noted.        Assessment & Plan:    Chronic pain syndrome Patient reported well control with current regimen. States oxycodone is helpful for breakthrough pain. Patient will be gone for 1 month for granddaughter's graduation and is requesting refill for oxycodone before leaving. Per chart review, patient is reliable and has good follow up with PCP.  -refilled prescription for oxycodone 5mg , have prescribed 20 pills -consider UDS at next follow up  -follow up in 1 month with PCP   Bed bug bite Patient with reported bed bug bit rash. States triamcinolone 0.1% cream has not helped relieve symptoms on hand. State that she has increased itching and excoriations due to itching. Notes swelling in right hand. No bed bugs noted on exam  -advised patient to wash all clothing and material in home and dry in dryer as heat will kill bed bugs -prescribed triamcinolone 0.5% bid x 10 days -follow up in 10 days if no improvement, informed patient to go to urgent care if on vacation -Atarax prn, informed patient of side effects and to stop taking if becoming symptomatic -strict return precautions given -follow up as needed   Return in about 1 month (  around 10/15/2017) for follow up with PCP .  Caroline More, DO, PGY-1

## 2017-09-14 ENCOUNTER — Other Ambulatory Visit: Payer: Self-pay | Admitting: Family Medicine

## 2017-09-15 ENCOUNTER — Ambulatory Visit (INDEPENDENT_AMBULATORY_CARE_PROVIDER_SITE_OTHER): Payer: Medicare HMO | Admitting: Family Medicine

## 2017-09-15 ENCOUNTER — Encounter: Payer: Self-pay | Admitting: Family Medicine

## 2017-09-15 DIAGNOSIS — M5386 Other specified dorsopathies, lumbar region: Secondary | ICD-10-CM

## 2017-09-15 DIAGNOSIS — W57XXXD Bitten or stung by nonvenomous insect and other nonvenomous arthropods, subsequent encounter: Secondary | ICD-10-CM

## 2017-09-15 DIAGNOSIS — G894 Chronic pain syndrome: Secondary | ICD-10-CM | POA: Diagnosis not present

## 2017-09-15 DIAGNOSIS — W57XXXA Bitten or stung by nonvenomous insect and other nonvenomous arthropods, initial encounter: Secondary | ICD-10-CM | POA: Insufficient documentation

## 2017-09-15 MED ORDER — TRIAMCINOLONE ACETONIDE 0.5 % EX CREA: 1.0000 "application " | TOPICAL_CREAM | Freq: Two times a day (BID) | CUTANEOUS | 0 refills | Status: AC

## 2017-09-15 MED ORDER — OXYCODONE HCL 5 MG PO TABS: 5.0000 mg | ORAL_TABLET | Freq: Four times a day (QID) | ORAL | 0 refills | Status: DC | PRN

## 2017-09-15 MED ORDER — HYDROXYZINE HCL 25 MG PO TABS: 25.0000 mg | ORAL_TABLET | Freq: Four times a day (QID) | ORAL | 0 refills | Status: DC | PRN

## 2017-09-15 NOTE — Assessment & Plan Note (Signed)
Patient with reported bed bug bit rash. States triamcinolone 0.1% cream has not helped relieve symptoms on hand. State that she has increased itching and excoriations due to itching. Notes swelling in right hand. No bed bugs noted on exam  -advised patient to wash all clothing and material in home and dry in dryer as heat will kill bed bugs -prescribed triamcinolone 0.5% bid x 10 days -follow up in 10 days if no improvement, informed patient to go to urgent care if on vacation -Atarax prn, informed patient of side effects and to stop taking if becoming symptomatic -strict return precautions given -follow up as needed

## 2017-09-15 NOTE — Patient Instructions (Signed)
It was a pleasure meeting you today.   Today we discussed your chronic pain and your rash. For chronic pain I have refilled your oxycodone 5mg . I have given you 20 pills.  For your rash please make sure to wash all your clothes and DRY IN THE DRYER as the heat will kill bed bugs. I have prescribed you a higher dose of triamcinolone cream to be used twice a day for 10 days and atarax for severe itching. Please follow up if rash does not improve. You may go to urgent care if on vacation.   Please follow up in 1 month or sooner if symptoms persist or worsen. Please call the clinic immediately if you have concerns.   Our clinic's number is [phone]. Please call with questions or concerns.   Thank you,  Caroline More, DO

## 2017-09-15 NOTE — Assessment & Plan Note (Signed)
Patient reported well control with current regimen. States oxycodone is helpful for breakthrough pain. Patient will be gone for 1 month for granddaughter's graduation and is requesting refill for oxycodone before leaving. Per chart review, patient is reliable and has good follow up with PCP.  -refilled prescription for oxycodone 5mg , have prescribed 20 pills -consider UDS at next follow up  -follow up in 1 month with PCP

## 2017-10-20 ENCOUNTER — Other Ambulatory Visit: Payer: Self-pay | Admitting: Family Medicine

## 2017-11-01 ENCOUNTER — Other Ambulatory Visit: Payer: Self-pay | Admitting: Family Medicine

## 2017-12-14 ENCOUNTER — Other Ambulatory Visit: Payer: Self-pay | Admitting: Family Medicine

## 2017-12-17 ENCOUNTER — Telehealth: Payer: Self-pay | Admitting: Family Medicine

## 2017-12-17 NOTE — Telephone Encounter (Signed)
Reviewed and handicap placard request form in PCP's box.Ozella Almond, CMA

## 2017-12-17 NOTE — Telephone Encounter (Signed)
Pt came in office and dropped a Disability Parking Placard application, requesting to be fill out by her PCP. Last DOS 09/15/17, Call [phone] if have any questions. Form was placed in Red team folder.

## 2017-12-18 ENCOUNTER — Telehealth: Payer: Self-pay | Admitting: Family Medicine

## 2017-12-18 ENCOUNTER — Encounter: Payer: Self-pay | Admitting: Family Medicine

## 2017-12-18 NOTE — Telephone Encounter (Signed)
Error

## 2017-12-18 NOTE — Telephone Encounter (Signed)
Completed and put on Alisas's desk

## 2017-12-18 NOTE — Telephone Encounter (Signed)
Left message on voicemail at [phone] that form is available for pick up. Copy made for scanning and original placed in envelope and in folder at front desk. Danley Danker, RN [facility] RN)

## 2018-01-15 ENCOUNTER — Other Ambulatory Visit: Payer: Self-pay

## 2018-01-18 MED ORDER — GABAPENTIN 300 MG PO CAPS: ORAL_CAPSULE | ORAL | 2 refills | Status: DC

## 2018-01-26 ENCOUNTER — Ambulatory Visit (INDEPENDENT_AMBULATORY_CARE_PROVIDER_SITE_OTHER): Payer: Medicare HMO | Admitting: Family Medicine

## 2018-01-26 ENCOUNTER — Other Ambulatory Visit: Payer: Self-pay

## 2018-01-26 ENCOUNTER — Encounter: Payer: Self-pay | Admitting: Family Medicine

## 2018-01-26 VITALS — BP 152/82 | HR 65 | Temp 97.5°F | Ht 64.0 in | Wt 180.0 lb

## 2018-01-26 DIAGNOSIS — M5386 Other specified dorsopathies, lumbar region: Secondary | ICD-10-CM | POA: Diagnosis not present

## 2018-01-26 DIAGNOSIS — R300 Dysuria: Secondary | ICD-10-CM

## 2018-01-26 DIAGNOSIS — E2839 Other primary ovarian failure: Secondary | ICD-10-CM

## 2018-01-26 DIAGNOSIS — F3289 Other specified depressive episodes: Secondary | ICD-10-CM | POA: Diagnosis not present

## 2018-01-26 LAB — POCT URINALYSIS DIP (MANUAL ENTRY)
BILIRUBIN UA: NEGATIVE
BILIRUBIN UA: NEGATIVE mg/dL
Blood, UA: NEGATIVE
GLUCOSE UA: NEGATIVE mg/dL
Leukocytes, UA: NEGATIVE
Nitrite, UA: NEGATIVE
Protein Ur, POC: 100 mg/dL — AB
SPEC GRAV UA: 1.015 (ref 1.010–1.025)
Urobilinogen, UA: 0.2 E.U./dL
pH, UA: 7 (ref 5.0–8.0)

## 2018-01-26 LAB — POCT UA - MICROSCOPIC ONLY

## 2018-01-26 MED ORDER — OXYCODONE HCL 5 MG PO TABS: 5.0000 mg | ORAL_TABLET | Freq: Four times a day (QID) | ORAL | 0 refills | Status: DC | PRN

## 2018-01-26 NOTE — Progress Notes (Signed)
Subjective  Patient is presenting with the following illnesses  BACK PAIN Pain is flaring again.  Mostly R sided radiates down to knee.  No specific weakness or incontinence.   No trauam or fever.  Using various analgesics as needed   FREQUENCY For last few days urinating more than usual.  No pain or hematuria or fever or change in color   ANXIETY Has episodes that usually last a few days of feeling down and very anxious with increased worry.  No suicidal ideation.  Does decrese appetite and worsen sleep.  After a few days ussually improves Has been on Prozac for years.  Decreased dose about a year ago   Chief Complaint noted Review of Symptoms - see HPI PMH - Smoking status noted.     Objective Vital Signs reviewed Uses walker for distant walking Able to get on tip toes and heels and do a slight deep knee bend Back - no deformity.  Tender over thoracic paraspinous muscles   UA noted    Assessments/Plans  Depression Worsened.  Has "attacks" that last a few days.  She does not want to pursue any counseling or medication change now but would be open to if worsens   Sciatica associated with disorder of lumbar spine Intermittent Flare. No red flags.  Continue as needed use of analgesics which keep her functional    Frequency - likely due to water intake.  Recommend decreasing and monitoring   See after visit summary for details of patient instuctions

## 2018-01-26 NOTE — Assessment & Plan Note (Signed)
Worsened.  Has "attacks" that last a few days.  She does not want to pursue any counseling or medication change now but would be open to if worsens

## 2018-01-26 NOTE — Patient Instructions (Addendum)
Good to see you today!  Thanks for coming in.  Have a great trip!!  Be safe  If the anxiety depression episodes worsen and you would like to talk with someone let me know or if you would like to adjust your medications   If you have worsening weakness or loss of control of bowel or bladder let me know  See [facility] about your colonoscopy   Get your bone density  Come back in 6 months or as needed

## 2018-01-26 NOTE — Assessment & Plan Note (Signed)
Intermittent Flare. No red flags.  Continue as needed use of analgesics which keep her functional

## 2018-02-03 ENCOUNTER — Other Ambulatory Visit: Payer: Self-pay | Admitting: Family Medicine

## 2018-02-04 ENCOUNTER — Telehealth: Payer: Self-pay | Admitting: Family Medicine

## 2018-02-04 ENCOUNTER — Other Ambulatory Visit: Payer: Self-pay | Admitting: Family Medicine

## 2018-02-04 NOTE — Telephone Encounter (Signed)
Called multiple times... Phone line was busy

## 2018-02-08 ENCOUNTER — Inpatient Hospital Stay: Admission: RE | Admit: 2018-02-08 | Payer: Medicare HMO | Source: Ambulatory Visit

## 2018-04-15 ENCOUNTER — Other Ambulatory Visit: Payer: Self-pay | Admitting: Family Medicine

## 2018-04-21 DIAGNOSIS — Z803 Family history of malignant neoplasm of breast: Secondary | ICD-10-CM | POA: Diagnosis not present

## 2018-04-21 DIAGNOSIS — Z1231 Encounter for screening mammogram for malignant neoplasm of breast: Secondary | ICD-10-CM | POA: Diagnosis not present

## 2018-04-28 ENCOUNTER — Encounter: Payer: Self-pay | Admitting: Family Medicine

## 2018-05-08 ENCOUNTER — Other Ambulatory Visit: Payer: Self-pay | Admitting: Family Medicine

## 2018-06-23 ENCOUNTER — Ambulatory Visit: Payer: Medicare HMO | Admitting: Podiatry

## 2018-06-30 ENCOUNTER — Other Ambulatory Visit: Payer: Self-pay | Admitting: Family Medicine

## 2018-07-01 ENCOUNTER — Other Ambulatory Visit: Payer: Self-pay | Admitting: Family Medicine

## 2018-07-23 ENCOUNTER — Other Ambulatory Visit: Payer: Self-pay | Admitting: Family Medicine

## 2018-08-13 DIAGNOSIS — M5126 Other intervertebral disc displacement, lumbar region: Secondary | ICD-10-CM | POA: Diagnosis not present

## 2018-08-13 DIAGNOSIS — M545 Low back pain: Secondary | ICD-10-CM | POA: Diagnosis not present

## 2018-09-13 ENCOUNTER — Other Ambulatory Visit: Payer: Self-pay | Admitting: Family Medicine

## 2018-09-14 ENCOUNTER — Ambulatory Visit: Payer: Medicare HMO | Admitting: Family Medicine

## 2018-09-21 ENCOUNTER — Ambulatory Visit: Payer: Medicare HMO | Admitting: Family Medicine

## 2018-09-21 ENCOUNTER — Other Ambulatory Visit: Payer: Self-pay | Admitting: Family Medicine

## 2018-09-28 ENCOUNTER — Ambulatory Visit (INDEPENDENT_AMBULATORY_CARE_PROVIDER_SITE_OTHER): Payer: Medicare HMO | Admitting: Family Medicine

## 2018-09-28 ENCOUNTER — Encounter: Payer: Self-pay | Admitting: Family Medicine

## 2018-09-28 VITALS — BP 148/88 | HR 66 | Temp 98.0°F | Ht 64.0 in | Wt 193.0 lb

## 2018-09-28 DIAGNOSIS — M5386 Other specified dorsopathies, lumbar region: Secondary | ICD-10-CM | POA: Diagnosis not present

## 2018-09-28 DIAGNOSIS — F3289 Other specified depressive episodes: Secondary | ICD-10-CM | POA: Diagnosis not present

## 2018-09-28 DIAGNOSIS — Z1211 Encounter for screening for malignant neoplasm of colon: Secondary | ICD-10-CM

## 2018-09-28 DIAGNOSIS — R5383 Other fatigue: Secondary | ICD-10-CM

## 2018-09-28 DIAGNOSIS — Z23 Encounter for immunization: Secondary | ICD-10-CM | POA: Diagnosis not present

## 2018-09-28 DIAGNOSIS — Z720 Tobacco use: Secondary | ICD-10-CM

## 2018-09-28 NOTE — Assessment & Plan Note (Signed)
Unsure of cause.  Will check labs and see back soon.

## 2018-09-28 NOTE — Patient Instructions (Addendum)
Good to see you today!  Thanks for coming in.  Come back in one week to go over   If you have chest pain or shortness of breath with the heart flutter go to the ER  Try Lever 2000 on your breasts

## 2018-09-28 NOTE — Assessment & Plan Note (Signed)
Worsened.  Likely due from being off her prozac but will check labs to rule out other causes

## 2018-09-28 NOTE — Assessment & Plan Note (Signed)
Stable.  Did not refill narcotics today.  Will discuss more next visit

## 2018-09-28 NOTE — Progress Notes (Signed)
Subjective  Latoya Swanson is a 67 y.o. female is presenting with the following  FATIGUE Feeling very tired over last few months when has been taking care of multiple famly members.  Trouble sleeping, feeling out of shape.  Has gained about 20 lbs.  No fever or new pain or nausea and vomiting or change in bowels. Does sometime feels like her heart flutters but not associated with syncope or chest pain or exertion  DEPRESSION Ran out of her Prozac about a month while out of town and stopped cold [place].  Feels she needs something for her nerves but hard to tell if her fatigue is causing her mood or if her mood is causing the fatigue.  No suicidal ideation  BACK PAIN Continues to have chronic back pain.  Out of oxycodone for several weeks.  Still taking other analgesics as needed.  Uses walker at times.  No new weakness or incontinence   Chief Complaint noted Review of Symptoms - see HPI PMH - Smoking status noted.    Objective Vital Signs reviewed BP (!) 148/88   Pulse 66   Temp 98 F (36.7 C) (Oral)   Ht 5\' 4"  (1.626 m)   Wt 193 lb (87.5 kg)   LMP 12/01/2001   SpO2 98%   BMI 33.13 kg/m  Mobility:able to get up and down from exam table without assistance or distress Heart - Regular rate and rhythm.  No murmurs, gallops or rubs.    Lungs:  Normal respiratory effort, chest expands symmetrically. Lungs are clear to auscultation, no crackles or wheezes. Extremities:  No cyanosis, edema, or deformity noted with good range of motion of all major joints.   Abdomen: soft and non-tender without masses, organomegaly or hernias noted.  No guarding or rebound Neck:  No deformities, thyromegaly, masses, or tenderness noted.   Supple with full range of motion without pain.  Assessments/Plans  See after visit summary for details of patient instuctions  Fatigue Unsure of cause.  Will check labs and see back soon.    Depression Worsened.  Likely due from being off her prozac but will check  labs to rule out other causes   Sciatica associated with disorder of lumbar spine Stable.  Did not refill narcotics today.  Will discuss more next visit

## 2018-09-29 LAB — CMP14+EGFR
ALT: 24 IU/L (ref 0–32)
AST: 24 IU/L (ref 0–40)
Albumin/Globulin Ratio: 1.6 (ref 1.2–2.2)
Albumin: 4.8 g/dL (ref 3.6–4.8)
Alkaline Phosphatase: 101 IU/L (ref 39–117)
BILIRUBIN TOTAL: 0.4 mg/dL (ref 0.0–1.2)
BUN/Creatinine Ratio: 24 (ref 12–28)
BUN: 16 mg/dL (ref 8–27)
CALCIUM: 11.1 mg/dL — AB (ref 8.7–10.3)
CHLORIDE: 101 mmol/L (ref 96–106)
CO2: 22 mmol/L (ref 20–29)
Creatinine, Ser: 0.67 mg/dL (ref 0.57–1.00)
GFR calc Af Amer: 106 mL/min/{1.73_m2} (ref >59)
GFR calc non Af Amer: 92 mL/min/{1.73_m2} (ref >59)
GLUCOSE: 97 mg/dL (ref 65–99)
Globulin, Total: 3 g/dL (ref 1.5–4.5)
POTASSIUM: 3.7 mmol/L (ref 3.5–5.2)
Sodium: 142 mmol/L (ref 134–144)
Total Protein: 7.8 g/dL (ref 6.0–8.5)

## 2018-09-29 LAB — CBC
HEMATOCRIT: 40.4 % (ref 34.0–46.6)
HEMOGLOBIN: 14 g/dL (ref 11.1–15.9)
MCH: 30.4 pg (ref 26.6–33.0)
MCHC: 34.7 g/dL (ref 31.5–35.7)
MCV: 88 fL (ref 79–97)
Platelets: 184 10*3/uL (ref 150–450)
RBC: 4.61 x10E6/uL (ref 3.77–5.28)
RDW: 12.4 % (ref 12.3–15.4)
WBC: 7.2 10*3/uL (ref 3.4–10.8)

## 2018-09-29 LAB — TSH: TSH: 2.15 u[IU]/mL (ref 0.450–4.500)

## 2018-10-06 ENCOUNTER — Ambulatory Visit (HOSPITAL_COMMUNITY)
Admission: RE | Admit: 2018-10-06 | Discharge: 2018-10-06 | Disposition: A | Discharge status: Home / Self Care | Payer: Medicare HMO | Source: Ambulatory Visit | Attending: Family Medicine | Admitting: Family Medicine

## 2018-10-06 ENCOUNTER — Ambulatory Visit (INDEPENDENT_AMBULATORY_CARE_PROVIDER_SITE_OTHER): Payer: Medicare HMO | Admitting: Family Medicine

## 2018-10-06 ENCOUNTER — Encounter: Payer: Self-pay | Admitting: Family Medicine

## 2018-10-06 VITALS — BP 144/88 | HR 86 | Temp 97.9°F | Ht 64.0 in | Wt 192.0 lb

## 2018-10-06 DIAGNOSIS — F3289 Other specified depressive episodes: Secondary | ICD-10-CM

## 2018-10-06 DIAGNOSIS — R002 Palpitations: Secondary | ICD-10-CM | POA: Insufficient documentation

## 2018-10-06 DIAGNOSIS — I499 Cardiac arrhythmia, unspecified: Secondary | ICD-10-CM

## 2018-10-06 MED ORDER — SERTRALINE HCL 50 MG PO TABS: 50.0000 mg | ORAL_TABLET | Freq: Every day | ORAL | 3 refills | Status: DC

## 2018-10-06 NOTE — Progress Notes (Signed)
Subjective  Latoya Swanson is a 67 y.o. female is presenting with the following  HEART FLUTTER Having episodes of feeling lightheadness and heart flutter racing. Usually when sitting or standing not with exertion.  No chest pain when happens.  Yesterday felt very light headed and had to sit down.  Does not exercise much due to back pain but no chest pain but does feel she is more short of breath than usual.  No edema or change in medications   DEPRESSION Off prozac.  Feels tired but may be related to weight gain.  Is active cooking and traveling.  Does have frequent crying spells and feel sis more irritable than she should be.  No history of mania. No suicidal ideation Would be interested in trying a medication and plans to contact mental health for counselin.  Chief Complaint noted Review of Symptoms - see HPI PMH - Smoking status noted.    Objective Vital Signs reviewed BP (!) 144/88   Pulse 86   Temp 97.9 F (36.6 C) (Oral)   Ht 5\' 4"  (1.626 m)   Wt 192 lb (87.1 kg)   LMP 12/01/2001   SpO2 97%   BMI 32.96 kg/m  Psych:  Cognition and judgment appear intact. Alert, communicative  and cooperative with normal attention span and concentration. No apparent delusions, illusions, hallucinations Heart - Regular rate and rhythm.  No murmurs, gallops or rubs.    Lungs:  Normal respiratory effort, chest expands symmetrically. Lungs are clear to auscultation, no crackles or wheezes. Extremities:  No cyanosis, edema, or deformity noted with good range of motion of all major joints.    Assessments/Plans  See after visit summary for details of patient instuctions  Heart palpitations New over the last 6-8 weeks.  Associated with lightheadedness.  Normal exam and ECG.   Normal recent labs including thyroid. Given is interfering with her life and her age will refer to cardiology for evaluation   Depression Not well controlled.  Start Zoloft 50 mg daily.  May increase in 2 weeks if not effective.   Encouraged counseling   Serum calcium elevated Asymptomatic with normal ecg.  Will recheck next visit

## 2018-10-06 NOTE — Patient Instructions (Addendum)
Happy Holidays!  For the heart racing - If you feel it coming on sit down - If it persists or you are having chest pain or shortness of breath then call 911 - Will refer you to cardiology.  If you do not hear from them in 10 days then call us  Depression Start with zoloft 50 mg once a day.  If not better in 2 weeks then take 2 tabs a day  We referred you to [facility] for hand surgery in 2018  Come back in one month

## 2018-10-06 NOTE — Assessment & Plan Note (Signed)
Not well controlled.  Start Zoloft 50 mg daily.  May increase in 2 weeks if not effective.  Encouraged counseling

## 2018-10-06 NOTE — Assessment & Plan Note (Signed)
New over the last 6-8 weeks.  Associated with lightheadedness.  Normal exam and ECG.   Normal recent labs including thyroid. Given is interfering with her life and her age will refer to cardiology for evaluation

## 2018-10-06 NOTE — Assessment & Plan Note (Signed)
Asymptomatic with normal ecg.  Will recheck next visit

## 2018-10-15 ENCOUNTER — Other Ambulatory Visit: Payer: Self-pay | Admitting: Family Medicine

## 2018-11-01 ENCOUNTER — Other Ambulatory Visit: Payer: Self-pay | Admitting: Family Medicine

## 2018-11-01 DIAGNOSIS — F3289 Other specified depressive episodes: Secondary | ICD-10-CM

## 2018-12-19 ENCOUNTER — Other Ambulatory Visit: Payer: Self-pay | Admitting: Family Medicine

## 2019-01-27 DIAGNOSIS — F17210 Nicotine dependence, cigarettes, uncomplicated: Secondary | ICD-10-CM | POA: Diagnosis not present

## 2019-01-27 DIAGNOSIS — J029 Acute pharyngitis, unspecified: Secondary | ICD-10-CM | POA: Diagnosis not present

## 2019-02-02 ENCOUNTER — Telehealth: Payer: Self-pay | Admitting: Family Medicine

## 2019-02-02 ENCOUNTER — Other Ambulatory Visit: Payer: Self-pay | Admitting: Family Medicine

## 2019-02-02 DIAGNOSIS — F3289 Other specified depressive episodes: Secondary | ICD-10-CM

## 2019-02-02 NOTE — Telephone Encounter (Signed)
Left message if she needs anything to call that I was just checking on her

## 2019-02-11 ENCOUNTER — Telehealth: Payer: Self-pay | Admitting: Family Medicine

## 2019-02-14 NOTE — Telephone Encounter (Signed)
error 

## 2019-02-28 ENCOUNTER — Other Ambulatory Visit: Payer: Self-pay | Admitting: *Deleted

## 2019-02-28 MED ORDER — AMLODIPINE BESYLATE 10 MG PO TABS: 10.0000 mg | ORAL_TABLET | Freq: Every day | ORAL | 0 refills | Status: DC

## 2019-03-24 ENCOUNTER — Other Ambulatory Visit: Payer: Self-pay | Admitting: Family Medicine

## 2019-04-22 ENCOUNTER — Other Ambulatory Visit: Payer: Self-pay | Admitting: Family Medicine

## 2019-05-25 ENCOUNTER — Telehealth: Payer: Self-pay

## 2019-05-25 ENCOUNTER — Other Ambulatory Visit: Payer: Self-pay | Admitting: Family Medicine

## 2019-05-25 NOTE — Telephone Encounter (Signed)
LVM for patient to call office to schedule appointment with Dr. Erin Hearing.    Ozella Almond, [place]

## 2019-05-26 ENCOUNTER — Other Ambulatory Visit: Payer: Self-pay | Admitting: Family Medicine

## 2019-06-08 ENCOUNTER — Other Ambulatory Visit: Payer: Self-pay | Admitting: Family Medicine

## 2019-06-09 ENCOUNTER — Other Ambulatory Visit: Payer: Self-pay | Admitting: Family Medicine

## 2019-06-10 ENCOUNTER — Other Ambulatory Visit: Payer: Self-pay | Admitting: Family Medicine

## 2019-06-26 ENCOUNTER — Other Ambulatory Visit: Payer: Self-pay | Admitting: Family Medicine

## 2019-07-28 ENCOUNTER — Other Ambulatory Visit: Payer: Self-pay | Admitting: Family Medicine

## 2019-08-16 ENCOUNTER — Other Ambulatory Visit: Payer: Self-pay

## 2019-08-16 ENCOUNTER — Ambulatory Visit: Payer: Medicare HMO | Admitting: Family Medicine

## 2019-08-17 ENCOUNTER — Other Ambulatory Visit: Payer: Self-pay

## 2019-08-17 ENCOUNTER — Encounter: Payer: Self-pay | Admitting: Family Medicine

## 2019-08-17 ENCOUNTER — Ambulatory Visit (INDEPENDENT_AMBULATORY_CARE_PROVIDER_SITE_OTHER): Payer: Medicare HMO | Admitting: Family Medicine

## 2019-08-17 DIAGNOSIS — M5386 Other specified dorsopathies, lumbar region: Secondary | ICD-10-CM

## 2019-08-17 DIAGNOSIS — I10 Essential (primary) hypertension: Secondary | ICD-10-CM | POA: Diagnosis not present

## 2019-08-17 DIAGNOSIS — F3289 Other specified depressive episodes: Secondary | ICD-10-CM

## 2019-08-17 DIAGNOSIS — E7800 Pure hypercholesterolemia, unspecified: Secondary | ICD-10-CM

## 2019-08-17 DIAGNOSIS — Z23 Encounter for immunization: Secondary | ICD-10-CM

## 2019-08-17 MED ORDER — BACLOFEN 10 MG PO TABS: ORAL_TABLET | ORAL | 3 refills | Status: DC

## 2019-08-17 MED ORDER — HYDROXYZINE HCL 25 MG PO TABS: 25.0000 mg | ORAL_TABLET | Freq: Four times a day (QID) | ORAL | 1 refills | Status: DC | PRN

## 2019-08-17 MED ORDER — SERTRALINE HCL 100 MG PO TABS: 100.0000 mg | ORAL_TABLET | Freq: Every day | ORAL | 0 refills | Status: DC

## 2019-08-17 MED ORDER — OXYCODONE HCL 5 MG PO TABS: 5.0000 mg | ORAL_TABLET | Freq: Four times a day (QID) | ORAL | 0 refills | Status: DC | PRN

## 2019-08-17 NOTE — Assessment & Plan Note (Signed)
Worsened.  At least partially due to her worsened depression.  Will prescribe a few oxycodone and continue varied analgesics.  [place] Database reviewed

## 2019-08-17 NOTE — Patient Instructions (Addendum)
Good to see you today!  Thanks for coming in.  Increase Sertraline to 100 mg daily for 2 weeks then 150 mg daily (1 and 1/2 tabs)  Come back in 3 weeks  I will call you if your lab tests are not normal.  Otherwise we will discuss them at your next visit.  Call if you are felling worse

## 2019-08-17 NOTE — Assessment & Plan Note (Signed)
Recheck today. 

## 2019-08-17 NOTE — Assessment & Plan Note (Signed)
BP Readings from Last 3 Encounters:  08/17/19 (!) 158/80  10/06/18 (!) 144/88  09/28/18 (!) 148/88   Not at goal.  Was emotional and in pain today but will likely need to increase her medications next visit

## 2019-08-17 NOTE — Progress Notes (Signed)
Subjective  Latoya Swanson is a 68 y.o. female is presenting with the following  DEPRESSION Feeling very down and anxious for last few weeks.  Sister died in hospice last week.  Has been away from home caring for her.  No suicidal ideation.  Does not feel sertraline is helping. Good appetite.  Sleep is interupted  HYPERTENSION Disease Monitoring Home BP Monitoring (Severity) not checking Symptoms - Chest pain- no    Dyspnea- no Medications   Compliance-  Reports taking her medications regularly. Lightheadedness-  no  Edema- no Timing - continuous  Duration - years  BACK PAIN Her chronic pain has worsened with her depression and being away from home.  Using diclofenac tylenol as needed tramadol or oxycodone.  No weakness or incontinence   Chief Complaint noted Review of Symptoms - see HPI PMH - Smoking status noted.    Objective Vital Signs reviewed BP (!) 158/80   Pulse 70   Wt 192 lb (87.1 kg)   LMP 12/01/2001   SpO2 99%   BMI 32.96 kg/m  Psych:  Cognition and judgment appear intact. Alert, communicative  and cooperative with normal attention span and concentration. No apparent delusions, illusions, hallucinations Walks using a cane   Assessments/Plans  Depression Worsened.  Seems partially situational due to her loss of sister.   Will increase sertraline (she formerly was on high dose prozac)   Essential hypertension BP Readings from Last 3 Encounters:  08/17/19 (!) 158/80  10/06/18 (!) 144/88  09/28/18 (!) 148/88   Not at goal.  Was emotional and in pain today but will likely need to increase her medications next visit   Sciatica associated with disorder of lumbar spine Worsened.  At least partially due to her worsened depression.  Will prescribe a few oxycodone and continue varied analgesics.  [place] Database reviewed   Serum calcium elevated Recheck today    See after visit summary for details of patient instructions

## 2019-08-17 NOTE — Assessment & Plan Note (Signed)
Worsened.  Seems partially situational due to her loss of sister.   Will increase sertraline (she formerly was on high dose prozac)

## 2019-08-18 LAB — CBC
Hematocrit: 40.3 % (ref 34.0–46.6)
Hemoglobin: 13.7 g/dL (ref 11.1–15.9)
MCH: 31.1 pg (ref 26.6–33.0)
MCHC: 34 g/dL (ref 31.5–35.7)
MCV: 91 fL (ref 79–97)
Platelets: 194 10*3/uL (ref 150–450)
RBC: 4.41 x10E6/uL (ref 3.77–5.28)
RDW: 12 % (ref 11.7–15.4)
WBC: 6.6 10*3/uL (ref 3.4–10.8)

## 2019-08-18 LAB — CMP14+EGFR
ALT: 23 IU/L (ref 0–32)
AST: 25 IU/L (ref 0–40)
Albumin/Globulin Ratio: 1.6 (ref 1.2–2.2)
Albumin: 4.5 g/dL (ref 3.8–4.8)
Alkaline Phosphatase: 103 IU/L (ref 39–117)
BUN/Creatinine Ratio: 21 (ref 12–28)
BUN: 13 mg/dL (ref 8–27)
Bilirubin Total: 0.3 mg/dL (ref 0.0–1.2)
CO2: 20 mmol/L (ref 20–29)
Calcium: 9.6 mg/dL (ref 8.7–10.3)
Chloride: 107 mmol/L — ABNORMAL HIGH (ref 96–106)
Creatinine, Ser: 0.61 mg/dL (ref 0.57–1.00)
GFR calc Af Amer: 108 mL/min/{1.73_m2} (ref >59)
GFR calc non Af Amer: 94 mL/min/{1.73_m2} (ref >59)
Globulin, Total: 2.8 g/dL (ref 1.5–4.5)
Glucose: 91 mg/dL (ref 65–99)
Potassium: 3.5 mmol/L (ref 3.5–5.2)
Sodium: 141 mmol/L (ref 134–144)
Total Protein: 7.3 g/dL (ref 6.0–8.5)

## 2019-08-18 LAB — LIPID PANEL
Chol/HDL Ratio: 3.4 ratio (ref 0.0–4.4)
Cholesterol, Total: 228 mg/dL — ABNORMAL HIGH (ref 100–199)
HDL: 68 mg/dL (ref >39)
LDL Chol Calc (NIH): 148 mg/dL — ABNORMAL HIGH (ref 0–99)
Triglycerides: 72 mg/dL (ref 0–149)
VLDL Cholesterol Cal: 12 mg/dL (ref 5–40)

## 2019-08-25 ENCOUNTER — Other Ambulatory Visit: Payer: Self-pay | Admitting: Family Medicine

## 2019-08-26 ENCOUNTER — Other Ambulatory Visit: Payer: Self-pay | Admitting: Family Medicine

## 2019-09-06 ENCOUNTER — Telehealth (INDEPENDENT_AMBULATORY_CARE_PROVIDER_SITE_OTHER): Payer: Medicare HMO | Admitting: Family Medicine

## 2019-09-06 ENCOUNTER — Ambulatory Visit: Payer: Medicare HMO | Admitting: Family Medicine

## 2019-09-06 ENCOUNTER — Other Ambulatory Visit: Payer: Self-pay

## 2019-09-06 DIAGNOSIS — F3289 Other specified depressive episodes: Secondary | ICD-10-CM

## 2019-09-06 NOTE — Assessment & Plan Note (Signed)
No improvement taking sertraline 150 mg daily.  She feels is still getting over her sisters death.   Would like to try prozac again  Stop sertraline for 2 days then prozac 20 mg daily for 1-2 weeks if no improvement take 2 tabs.  (in past was on 60 mg daily)   She is interested in counseling.  Will ask Ms Laurance Flatten to contact.  To follow up with me in 2 weeks

## 2019-09-06 NOTE — Assessment & Plan Note (Signed)
Back to normal 

## 2019-09-06 NOTE — Progress Notes (Signed)
[facility] Telemedicine Visit  Patient consented to have virtual visit. Method of visit: Telephone  Encounter participants: Patient: Latoya Swanson - located at home Provider: [name] - located at office Others (if applicable): no  Chief Complaint: Depression follow up   HPI: Feels is no better from her depression.  Does not feel sertraline is helping - currently taking 2 tabs.  No suicidal ideation but feels anxious and sad.  Not sleeping very well.  Feels is related to her sisters recent death.   Is agreeable to counseling. Did so [facility]   ROS: per HPI  Pertinent PMHx: recurrent depression and chronic pain  Exam:  Respiratory: normal   Assessment/Plan:  Serum calcium elevated Back to normal   Depression No improvement taking sertraline 150 mg daily.  She feels is still getting over her sisters death.   Would like to try prozac again  Stop sertraline for 2 days then prozac 20 mg daily for 1-2 weeks if no improvement take 2 tabs.  (in past was on 60 mg daily)   She is interested in counseling.  Will ask Ms Laurance Flatten to contact.  To follow up with me in 2 weeks     Time spent during visit with patient: 13 minutes  Assessments/Plans  Serum calcium elevated Back to normal   Depression No improvement taking sertraline 150 mg daily.  She feels is still getting over her sisters death.   Would like to try prozac again  Stop sertraline for 2 days then prozac 20 mg daily for 1-2 weeks if no improvement take 2 tabs.  (in past was on 60 mg daily)   She is interested in counseling.  Will ask Ms Laurance Flatten to contact.  To follow up with me in 2 weeks    See after visit summary for details of patient instructions

## 2019-09-07 ENCOUNTER — Telehealth: Payer: Self-pay | Admitting: Licensed Clinical Social Worker

## 2019-09-07 NOTE — Telephone Encounter (Signed)
   Care Coordination Phone outreach Note Social Work   09/07/2019 Name: LEIYA CRAWLEY MRN: [id number] DOB: 03-31-1951  Vivien Presto Shane is a 68 y.o. year old female who sees Chambliss, Jeb Levering, MD for primary care. LCSW was consulted for care coordination related to  [place] and Resources. LCSW called patient to schedule appointment to assess needs and barriers.Patient reports not doing well since sister recently passed away.   Recommendation:Patient may benefit from and is in agreement to receive further assessment and brief therapeutic interventions to assist with managing her symptoms and to connect to ongoing mental health support. Interventions: Patient had her twin grandchildren today and unable to committee to scheduling a phone appointment with LCSW.  Briefly reviewed with patient services LCSW would be providing. Also advised patient to contact her insurance provider for a list of in-net work mental health providers. Plan:  1. Patient will call [employer] to request a list of in-network providers  2. Patient requested to call LCSW back once she is able to identify a day that she will not have her grandchildren  3. LCSW will F/U with patient by phone is no return call is received in 5 to 7 days  Dr. Erin Hearing has been informed of this outreach and plan.  Casimer Lanius, LCSW Clinical Social Worker [place] / [place]   [phone] 11:38 AM

## 2019-09-16 ENCOUNTER — Ambulatory Visit: Payer: Self-pay | Admitting: Licensed Clinical Social Worker

## 2019-09-16 NOTE — Chronic Care Management (AMB) (Signed)
  Social Work Care Management  Unsuccessful Phone Outreach  09/16/2019 Name: Latoya Swanson MRN: [id number] DOB: 03/01/1951  Referred by: [name], MD Reason for referral : Care Coordination F/U call to assist with managing symptoms of depression.  LCSW made contact with patient on 09/07/19.  At that time patient indicated she would call LCSW to schedule a phone appointment.    Unsuccessful F/U telephone outreach was attempted today. Dr. Erin Hearing has been notified of my unsuccessful attempt to make or maintain contact with the patient. LCSW is pleased to engage with this patient at any time in the future if she is interested in assistance.   Plan: No further follow up by LCSW at this time.   Casimer Lanius, LCSW Clinical Social Worker [place] / [place]   [phone] 3:06 PM

## 2019-11-07 ENCOUNTER — Other Ambulatory Visit: Payer: Self-pay | Admitting: Family Medicine

## 2019-11-13 ENCOUNTER — Other Ambulatory Visit: Payer: Self-pay | Admitting: Family Medicine

## 2020-01-06 ENCOUNTER — Ambulatory Visit: Payer: Medicare HMO | Attending: Internal Medicine

## 2020-01-06 DIAGNOSIS — Z23 Encounter for immunization: Secondary | ICD-10-CM | POA: Insufficient documentation

## 2020-01-06 NOTE — Progress Notes (Signed)
   Covid-19 Vaccination Clinic  Name:  Latoya Swanson    MRN: [id number] DOB: Apr 04, 1951  01/06/2020  Latoya Swanson was observed post Covid-19 immunization for 15 minutes without incidence. She was provided with Vaccine Information Sheet and instruction to access the V-Safe system.   Latoya Swanson was instructed to call 911 with any severe reactions post vaccine: Marland Kitchen Difficulty breathing  . Swelling of your face and throat  . A fast heartbeat  . A bad rash all over your body  . Dizziness and weakness    Immunizations Administered    Name Date Dose VIS Date Route   Pfizer COVID-19 Vaccine 01/06/2020 10:41 AM 0.3 mL 10/21/2019 Intramuscular   Manufacturer: [place]   Lot: [id number]   [place]: [id number]

## 2020-01-31 ENCOUNTER — Ambulatory Visit: Payer: Medicare HMO | Attending: Internal Medicine

## 2020-01-31 DIAGNOSIS — Z23 Encounter for immunization: Secondary | ICD-10-CM

## 2020-01-31 NOTE — Progress Notes (Signed)
   Covid-19 Vaccination Clinic  Name:  MARJO BIETZ    MRN: [id number] DOB: [birth year]  01/31/2020  Ms. Simmering was observed post Covid-19 immunization for 15 minutes without incident. She was provided with Vaccine Information Sheet and instruction to access the V-Safe system.   Ms. Bier was instructed to call 911 with any severe reactions post vaccine: Marland Kitchen Difficulty breathing  . Swelling of face and throat  . A fast heartbeat  . A bad rash all over body  . Dizziness and weakness   Immunizations Administered    Name Date Dose VIS Date Route   Pfizer COVID-19 Vaccine 01/31/2020  1:05 PM 0.3 mL 10/21/2019 Intramuscular   Manufacturer: [place]   Lot: [id number]   [place]: [id number]

## 2020-02-06 ENCOUNTER — Other Ambulatory Visit: Payer: Self-pay | Admitting: Family Medicine

## 2020-02-21 ENCOUNTER — Ambulatory Visit: Payer: Medicare HMO | Admitting: Family Medicine

## 2020-02-22 ENCOUNTER — Ambulatory Visit: Payer: Medicare HMO | Admitting: Family Medicine

## 2020-02-29 ENCOUNTER — Other Ambulatory Visit: Payer: Self-pay

## 2020-02-29 ENCOUNTER — Ambulatory Visit (INDEPENDENT_AMBULATORY_CARE_PROVIDER_SITE_OTHER): Payer: Medicare HMO | Admitting: Family Medicine

## 2020-02-29 ENCOUNTER — Encounter: Payer: Self-pay | Admitting: Family Medicine

## 2020-02-29 VITALS — BP 148/80 | HR 70 | Ht 65.0 in | Wt 196.2 lb

## 2020-02-29 DIAGNOSIS — R5383 Other fatigue: Secondary | ICD-10-CM

## 2020-02-29 DIAGNOSIS — F3289 Other specified depressive episodes: Secondary | ICD-10-CM | POA: Diagnosis not present

## 2020-02-29 DIAGNOSIS — Z1211 Encounter for screening for malignant neoplasm of colon: Secondary | ICD-10-CM | POA: Diagnosis not present

## 2020-02-29 DIAGNOSIS — M5386 Other specified dorsopathies, lumbar region: Secondary | ICD-10-CM

## 2020-02-29 MED ORDER — OXYCODONE HCL 5 MG PO CAPS: 5.0000 mg | ORAL_CAPSULE | ORAL | 0 refills | Status: DC | PRN

## 2020-02-29 NOTE — Assessment & Plan Note (Signed)
Chronic stable.  Refilled her oxycodone.  Latoya Swanson

## 2020-02-29 NOTE — Progress Notes (Signed)
    SUBJECTIVE:   CHIEF COMPLAINT / HPI:   FATIGUE For last months has felt more tired than usual.  Just gives out.  Thinks is may have gotten worse after Covid vaccine.  Could be related to anxiety.  No weight loss or fever or unusual shortness of breath or chest pain   ANXIETY PHQ9 = 8.  3 points for tired.  Is not sleeping very well.  No suicidal ideation.  Taking Prozac 20 mg a day she thinks.  Did not bring in her meds  BACK PAIN Chronic.  Worsens and improves.  Takes gabapentin semi regularly and rarely baclofen and oxycodone. Uses a cane but no longer a walker.  No focal weakness  PERTINENT  PMH / PSH: recurrent episodes of severe back pain  OBJECTIVE:   BP (!) 148/80   Pulse 70   Ht 5\' 5"  (1.651 m)   Wt 196 lb 3.2 oz (89 kg)   LMP 12/01/2001   SpO2 98%   BMI 32.65 kg/m   Heart - Regular rate and rhythm.  No murmurs, gallops or rubs.    Lungs:  Normal respiratory effort, chest expands symmetrically. Lungs are clear to auscultation, no crackles or wheezes. Extremities:  No cyanosis, edema, or deformity noted with good range of motion of all major joints.     ASSESSMENT/PLAN:   Depression Mild exacerbation.  Suggested counseling - see after visit summary.  Need to review Prozac dose when she returns   Fatigue Recurrent.  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  Refilled her oxycodone.  [place], MD [place]

## 2020-02-29 NOTE — Patient Instructions (Signed)
Good to see you today!  Thanks for coming in.  I will call you if your lab tests are not normal.  Otherwise we will discuss them at your next visit.  Consider a counselor for the anxiety - [facility]  [phone]  Come back with all your medications in 3 weeks   I will put in referral to [facility] they will call you

## 2020-02-29 NOTE — Assessment & Plan Note (Signed)
Mild exacerbation.  Suggested counseling - see after visit summary.  Need to review Prozac dose when she returns

## 2020-02-29 NOTE — Assessment & Plan Note (Signed)
Recurrent.  Likely mood related but will check labs to rule out complications of her chronic medications and recent covid shot

## 2020-03-01 LAB — CBC
Hematocrit: 41.4 % (ref 34.0–46.6)
Hemoglobin: 14.2 g/dL (ref 11.1–15.9)
MCH: 31.7 pg (ref 26.6–33.0)
MCHC: 34.3 g/dL (ref 31.5–35.7)
MCV: 92 fL (ref 79–97)
Platelets: 181 10*3/uL (ref 150–450)
RBC: 4.48 x10E6/uL (ref 3.77–5.28)
RDW: 12 % (ref 11.7–15.4)
WBC: 5.8 10*3/uL (ref 3.4–10.8)

## 2020-03-01 LAB — CMP14+EGFR
ALT: 18 IU/L (ref 0–32)
AST: 23 IU/L (ref 0–40)
Albumin/Globulin Ratio: 1.7 (ref 1.2–2.2)
Albumin: 4.7 g/dL (ref 3.8–4.8)
Alkaline Phosphatase: 103 IU/L (ref 39–117)
BUN/Creatinine Ratio: 19 (ref 12–28)
BUN: 14 mg/dL (ref 8–27)
Bilirubin Total: 0.5 mg/dL (ref 0.0–1.2)
CO2: 21 mmol/L (ref 20–29)
Calcium: 9.8 mg/dL (ref 8.7–10.3)
Chloride: 103 mmol/L (ref 96–106)
Creatinine, Ser: 0.75 mg/dL (ref 0.57–1.00)
GFR calc Af Amer: 95 mL/min/{1.73_m2} (ref >59)
GFR calc non Af Amer: 82 mL/min/{1.73_m2} (ref >59)
Globulin, Total: 2.8 g/dL (ref 1.5–4.5)
Glucose: 94 mg/dL (ref 65–99)
Potassium: 3.6 mmol/L (ref 3.5–5.2)
Sodium: 139 mmol/L (ref 134–144)
Total Protein: 7.5 g/dL (ref 6.0–8.5)

## 2020-03-01 LAB — TSH: TSH: 1.68 u[IU]/mL (ref 0.450–4.500)

## 2020-03-13 ENCOUNTER — Other Ambulatory Visit: Payer: Self-pay | Admitting: Family Medicine

## 2020-03-14 ENCOUNTER — Other Ambulatory Visit: Payer: Self-pay | Admitting: Family Medicine

## 2020-03-27 ENCOUNTER — Other Ambulatory Visit: Payer: Self-pay

## 2020-03-27 ENCOUNTER — Ambulatory Visit (INDEPENDENT_AMBULATORY_CARE_PROVIDER_SITE_OTHER): Payer: Medicare HMO | Admitting: Family Medicine

## 2020-03-27 ENCOUNTER — Encounter: Payer: Self-pay | Admitting: Family Medicine

## 2020-03-27 DIAGNOSIS — F3289 Other specified depressive episodes: Secondary | ICD-10-CM

## 2020-03-27 DIAGNOSIS — R5383 Other fatigue: Secondary | ICD-10-CM | POA: Diagnosis not present

## 2020-03-27 MED ORDER — HYDROXYZINE HCL 25 MG PO TABS: 25.0000 mg | ORAL_TABLET | Freq: Four times a day (QID) | ORAL | 1 refills | Status: DC | PRN

## 2020-03-27 MED ORDER — FAMOTIDINE 20 MG PO TABS
20.0000 mg | ORAL_TABLET | Freq: Two times a day (BID) | ORAL | 1 refills | Status: DC | PRN
Start: 2020-03-27 — End: 2020-04-20

## 2020-03-27 NOTE — Assessment & Plan Note (Signed)
Seems mostly related to mood. Labs reassuring.  Sleep changes and more activity should help.

## 2020-03-27 NOTE — Patient Instructions (Addendum)
Good to see you today!  Thanks for coming in.  For the fatigue - go to bed around 10 and up around 530-6 every day  Increased regular activity every day  Consider following up with a counselor  I will check on the colonoscopy   Try famotidine when you have heart burn  Come back in 3 months or if things are not getting better

## 2020-03-27 NOTE — Progress Notes (Signed)
    SUBJECTIVE:   CHIEF COMPLAINT / HPI:      SUBJECTIVE:   CHIEF COMPLAINT / HPI:   FATIGUE Feels this is a little better.  Does feel her sleep is disordered.  Has been going to be at around 830 and arises around 630.  No weight loss or fever or edema or chest pain or shortness of breath   ANXIETY PHQ9 = 8.  3 points for tired.  Is not sleeping very well.  No suicidal ideation.  Taking Prozac 40 mg a day.  REFLUX Has heartburn with coffee in the AM on an empty stomach    PERTINENT  PMH / PSH: recurrent episodes of severe back pain  OBJECTIVE:   Psych:  Cognition and judgment appear intact. Alert, communicative  and cooperative with normal attention span and concentration. No apparent delusions, illusions, hallucinations    ASSESSMENT/PLAN:   Depression Stable.  PHQ unchanged but she feels better and is brighter.  Discussed sleep hygiene and goals/expectations.  Has been spending way to [place] in bed.  See after visit summary.  Continue Prozac at 40 mg daily.  She is investigating a counselor  Fatigue Seems mostly related to mood. Labs reassuring.  Sleep changes and more activity should help.     Reflux - no red flags or weight loss or bleeding.  Trial of pepcid and change in coffee habits   [name], MD [place]

## 2020-03-27 NOTE — Assessment & Plan Note (Addendum)
Stable.  PHQ unchanged but she feels better and is brighter.  Discussed sleep hygiene and goals/expectations.  Has been spending way to [place] in bed.  See after visit summary.  Continue Prozac at 40 mg daily.  She is investigating a Social worker

## 2020-04-20 ENCOUNTER — Other Ambulatory Visit: Payer: Self-pay | Admitting: Family Medicine

## 2020-05-16 ENCOUNTER — Encounter: Payer: Self-pay | Admitting: Family Medicine

## 2020-05-16 ENCOUNTER — Other Ambulatory Visit: Payer: Self-pay

## 2020-05-16 ENCOUNTER — Ambulatory Visit (INDEPENDENT_AMBULATORY_CARE_PROVIDER_SITE_OTHER): Payer: Medicare HMO | Admitting: Family Medicine

## 2020-05-16 DIAGNOSIS — F3289 Other specified depressive episodes: Secondary | ICD-10-CM | POA: Diagnosis not present

## 2020-05-16 MED ORDER — HYDROXYZINE HCL 25 MG PO TABS: ORAL_TABLET | ORAL | 1 refills | Status: DC

## 2020-05-16 NOTE — Patient Instructions (Signed)
So sorry for your loss  Take the atarax 1-2 tabs at night and 1 every 8 hours as needed during the day  Continue Prozax 60 mg (3 tabs a day)  Contact a counselor  See me in two weeks or call as needed   Therapy and Counseling Resources Most providers on this list will take Medicaid. Patients with commercial insurance or Medicare should contact their insurance company to get a list of in network providers.  [facility]  [address], [place], [place] [address]      [facility] [address]  [place], [place] [address] [phone]  [facility] [address], [place]  [place], [place] [address]       [phone]      Jinny Blossom [facility] [address], [place], [place]  [facility] Solutions:  [place]. [place]  [facility]:  [place] [address] Loni Muse, [place]  [facility] (under & uninsured) [address], [place] [phone]    [email or web address]    [facility] [place]     Phone:  [phone]     [place]  [place] [address]      [place], [place] ext [place]: [place], [place], [place]   [place] ([address]-Road therapist) [address] [place] [address]   [place] [address]    [phone]    [facility]   [place],  [place], [place]   [place]  [place]  [facility]  [address] [place], [place]        [phone]  Open Access/Walk In Clinic under & uninsured  [place], To schedule an appointment call [phone] [address], [place] [phone]):  Fay Records from 8 AM - 3  PM Moving June 1 to [employer] at [facility] [address] [place],  ([place])   [place], [place]: [phone]) 8:30 - 12; 1 - 2:30  [facility] of the [place],  [place], [place]    [phone]):8:30 - 12; 2 - 3PM  [facility] [place],  [address],  [place]; [phone]):   Mon - Fri 8 AM - 5 PM   Specific Provider options Psychology Today  [email or web address].[email or web address] 1. click on find a therapist  2. enter your zip code 3. left side and select or tailor a therapist for your specific need.    24- Hour Availability:  .  [facility]  . [place], [place] [facility] [phone]  . Family Service of the [employer] [phone]  [facility] Crisis Service  [phone]   . [place]  [phone] (after hours)  . Therapeutic Alternative/Mobile Crisis   [phone]  . [place] National Suicide Hotline  [phone] ([place])  . Call 911 or go to emergency room  . [employer]  [phone]);  [place] and [employer]   . [facility] ACCESS  [phone]); [place], [place], [place], [place], [place], [place], [place]

## 2020-05-16 NOTE — Assessment & Plan Note (Signed)
Worsened due to grief reaction from grandson's death.   Agricultural engineer for counselors.  Started hydroxyzine for anxiety attacks.  Continue higher dose Prozac.  Close follow up

## 2020-05-16 NOTE — Progress Notes (Signed)
    SUBJECTIVE:   CHIEF COMPLAINT / HPI:   GRIEF Her grandson, with whom she was very close, died two weeks ago.  She is very sad about this and very resentful of her daughter, his mother.   Not sleeping well, shaking, chest feels tight, crying a lot, having outbursts. She increased her Prozac to 3 tabs a day.  Does not feel is helping yet. No suicidal ideation   PERTINENT  PMH / PSH: has taken hydroxyzine in past for anxiety  OBJECTIVE:   BP 132/80   Pulse 74   Wt 191 lb 3.2 oz (86.7 kg)   LMP 12/01/2001   SpO2 97%   BMI 31.82 kg/m   Tearful Psych:  Cognition and judgment appear intact. Alert, communicative  and cooperative with normal attention span and concentration. No apparent delusions, illusions, hallucinations   ASSESSMENT/PLAN:   Depression Worsened due to grief reaction from grandson's death.   Agricultural engineer for counselors.  Started hydroxyzine for anxiety attacks.  Continue higher dose Prozac.  Close follow up      [name], [place]

## 2020-05-17 ENCOUNTER — Telehealth: Payer: Self-pay

## 2020-05-17 NOTE — Telephone Encounter (Signed)
Called at [phone] and [phone] left vms on husband cell that I was trying to contact her

## 2020-05-17 NOTE — Telephone Encounter (Signed)
Patient calls nurse line and is very frustrated regarding medication management. Patient states that she has been on hydroxyzine in the past and it does not work for anxiety, increased irritability or as a sleep aide. Patient yelling on the phone expressing her frustrations.   Forwarding to PCP for further management.   Talbot Grumbling, RN

## 2020-05-18 NOTE — Telephone Encounter (Signed)
Left VM on both numbers with my cell number if need to get in touch wi me over the weekend

## 2020-05-29 ENCOUNTER — Ambulatory Visit: Payer: Medicare HMO | Admitting: Family Medicine

## 2020-06-13 ENCOUNTER — Encounter: Payer: Self-pay | Admitting: Family Medicine

## 2020-06-13 ENCOUNTER — Other Ambulatory Visit: Payer: Self-pay

## 2020-06-13 ENCOUNTER — Ambulatory Visit (INDEPENDENT_AMBULATORY_CARE_PROVIDER_SITE_OTHER): Payer: Medicare HMO | Admitting: Family Medicine

## 2020-06-13 DIAGNOSIS — M5386 Other specified dorsopathies, lumbar region: Secondary | ICD-10-CM

## 2020-06-13 DIAGNOSIS — K921 Melena: Secondary | ICD-10-CM | POA: Diagnosis not present

## 2020-06-13 DIAGNOSIS — F3289 Other specified depressive episodes: Secondary | ICD-10-CM

## 2020-06-13 MED ORDER — OXYCODONE HCL 5 MG PO CAPS: 5.0000 mg | ORAL_CAPSULE | ORAL | 0 refills | Status: DC | PRN

## 2020-06-13 NOTE — Progress Notes (Signed)
    SUBJECTIVE:   CHIEF COMPLAINT / HPI:   RECTAL BLEEDING A few episodes over the last weeks when notes blood on TP and in stool.  Unsure of amount.  Has been active around the house.  No consistent lightheadness or change in her baseline chest pain. No swelling No syncope   MOOD Still very anxious.  Taking prozac 3 tabs a day and hydroxyzine several times a day. Has not contacted a Social worker.   No suicidal ideation.  Is staying active.  Has not been taking care of herself much due caring for her twin grandtrs   PERTINENT  PMH / PSH: chronic pain from sciatica that intermittently flares   OBJECTIVE:   BP 137/70   Pulse 71   Ht 5' 4.5" (1.638 m)   Wt 193 lb 3.2 oz (87.6 kg)   LMP 12/01/2001   SpO2 97%   BMI 32.65 kg/m   Heart - Regular rate and rhythm.  No murmurs, gallops or rubs.    Lungs:  Normal respiratory effort, chest expands symmetrically. Lungs are clear to auscultation, no crackles or wheezes. No edema Using cane able to walk around clinic without difficulty   ASSESSMENT/PLAN:   Blood in stool Sounds most consistent with  hemorrhoidal bleeding.  No systemic signs of anemia.  Check CBC.  Re-refer for colonoscopy   Sciatica associated with disorder of lumbar spine Intermittent flare.  Will refill oxycodone.  She uses #30 about every 3-4 months   Depression Seems most consistent with Anxiety/grief reaction.  See after visit summary.  Will decrease prozac since does not seem to be helping and may be worsening.  Strongly encouraged counseling.  Close follow up      [name], [place]

## 2020-06-13 NOTE — Assessment & Plan Note (Signed)
Seems most consistent with Anxiety/grief reaction.  See after visit summary.  Will decrease prozac since does not seem to be helping and may be worsening.  Strongly encouraged counseling.  Close follow up

## 2020-06-13 NOTE — Progress Notes (Signed)
Provider aware of PHQ9.  Recorded after provider viewed.  Ozella Almond, [place]

## 2020-06-13 NOTE — Assessment & Plan Note (Addendum)
Sounds most consistent with  hemorrhoidal bleeding.  No systemic signs of anemia.  Check CBC.  Re-refer for colonoscopy

## 2020-06-13 NOTE — Patient Instructions (Signed)
Good to see you today - Thanks for coming in  For the bleeding  We will check a blood count to day and I will call you  If you have a lot of bleeding   Or if feeling very lightheadness or persistent chest pain then  go to the ER   For the mood  Cut down on prozac to 2 tabs a day  Keep taking the hydroxyzine  Call the counselor   Minimize the use of the pain medicaton  Please always bring your medication bottles  Come back to see me in 2 weeks

## 2020-06-13 NOTE — Assessment & Plan Note (Signed)
Intermittent flare.  Will refill oxycodone.  She uses #30 about every 3-4 months

## 2020-06-14 LAB — CBC
Hematocrit: 36.8 % (ref 34.0–46.6)
Hemoglobin: 12.5 g/dL (ref 11.1–15.9)
MCH: 31.3 pg (ref 26.6–33.0)
MCHC: 34 g/dL (ref 31.5–35.7)
MCV: 92 fL (ref 79–97)
Platelets: 189 10*3/uL (ref 150–450)
RBC: 4 x10E6/uL (ref 3.77–5.28)
RDW: 12.4 % (ref 11.7–15.4)
WBC: 7.8 10*3/uL (ref 3.4–10.8)

## 2020-06-16 ENCOUNTER — Other Ambulatory Visit: Payer: Self-pay | Admitting: Family Medicine

## 2020-06-26 ENCOUNTER — Other Ambulatory Visit: Payer: Self-pay

## 2020-06-26 ENCOUNTER — Encounter: Payer: Self-pay | Admitting: Family Medicine

## 2020-06-26 ENCOUNTER — Ambulatory Visit (INDEPENDENT_AMBULATORY_CARE_PROVIDER_SITE_OTHER): Payer: Medicare HMO | Admitting: Family Medicine

## 2020-06-26 DIAGNOSIS — K921 Melena: Secondary | ICD-10-CM | POA: Diagnosis not present

## 2020-06-26 DIAGNOSIS — F3289 Other specified depressive episodes: Secondary | ICD-10-CM

## 2020-06-26 NOTE — Assessment & Plan Note (Signed)
Continues to seem more like anxiety or grief.  GAD - 13  PHQ9 - 14.  She has counseling scheduled.  Will continue to wean down her prozac.  If not improving she would like to see psychiatry.  Gave her a list.

## 2020-06-26 NOTE — Assessment & Plan Note (Signed)
Stable.  Has not heard from GI.  Asked her to call them for evaluation.  No signs of anemia

## 2020-06-26 NOTE — Progress Notes (Signed)
CHIEF COMPLAINT / HPI:   RECTAL BLEEDING No episodes of BRB .  No consistent lightheadness or change in her baseline chest pain. No swelling No syncope   MOOD Still very anxious.  Taking prozac 2 tabs a day and hydroxyzine several times a day. Has an appointment with counselor on Friday.   No suicidal ideation.  Is staying active.  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 Alert, communicative  and cooperative with normal attention span and concentration. No apparent delusions, illusions, hallucinations   ASSESSMENT/PLAN:   Blood in stool Stable.  Has not heard from GI.  Asked her to call them for evaluation.  No signs of anemia   Depression Continues to seem more like anxiety or grief.  GAD - 13  PHQ9 - 14.  She has counseling scheduled.  Will continue to wean down her prozac.  If not improving she would like to see psychiatry.  Gave her a list.       [name], MD [place]

## 2020-06-26 NOTE — Patient Instructions (Addendum)
Good to see you today!  Thanks for coming in.  Call [facility] - I have put in a referral  [place]: [phone]  For the Mood  Go down to one Prozac per day   Keep taking the hydroxyzine  See how the counseling goes  Strict schedule to bed and getting up   If not helping then contact psychiatry    Get a mammogram  Come back in 2 months or sooner if not improving    Psychiatry Resource List (Adults and Children) Most of these providers will take Medicaid. please consult your insurance for a complete and updated list of available providers. When calling to make an appointment have your insurance information available to confirm you are covered.   [place], [place]:   [facility]: [address]     [phone]   Linna Hoff: [place]. [place],        [phone] [place]: [place],    [place]: [place],                   [place] Children: [place]         [place]  (Psychiatry only; Adults /children 12 and over, will take Medicaid)  [place], [place], [place] [address]       [phone]   [facility] (Psychiatry & counseling ; adults & children ; will take Medicaid [address]  [address]  [place] [address]   Go on-line to complete referral ( [email or web address] [phone]    (Spanish therapist)  [facility]  Psychiatry & counseling; Adults and children;  Call Registration prior to scheduling an appointment [phone] [address]    [place], [place] [address]    [phone]  [facility] (Psychiatry & counseling; adults & children; Medicare no Medicaid)  [place], [place]  [address]      [phone]    [facility] (up to age 54)  Psychiatry & counseling ,will take Medicaid, must do counseling to receive psychiatry services  [address] [place] [address]        ([place] [facility] (Psychiatry & counseling; adults & children; will take Medicaid) Will need a referral from provider [address],  [place], [place]  [phone]   [facility] --- Walk-In Mon-Friday 8am-3pm ( will take Medicaid, Psychiatry, Adults & children,  [address], [place], [place]   [phone]   [facility]--, Walk-in M-F 8am-12pm and 1pm -3pm   (Counseling, Psychiatry, will take Medicaid, adults & children)  [address] [place], [place]  [phone]

## 2020-07-12 ENCOUNTER — Other Ambulatory Visit: Payer: Self-pay | Admitting: Family Medicine

## 2020-07-19 ENCOUNTER — Other Ambulatory Visit: Payer: Self-pay | Admitting: Family Medicine

## 2020-07-26 ENCOUNTER — Encounter: Payer: Self-pay | Admitting: Family Medicine

## 2020-08-15 ENCOUNTER — Other Ambulatory Visit: Payer: Self-pay | Admitting: Family Medicine

## 2020-09-17 ENCOUNTER — Other Ambulatory Visit: Payer: Self-pay | Admitting: Family Medicine

## 2020-10-15 ENCOUNTER — Other Ambulatory Visit: Payer: Self-pay | Admitting: Family Medicine

## 2020-10-22 ENCOUNTER — Other Ambulatory Visit: Payer: Self-pay | Admitting: Family Medicine

## 2020-11-14 ENCOUNTER — Other Ambulatory Visit: Payer: Self-pay | Admitting: Family Medicine

## 2020-12-10 ENCOUNTER — Other Ambulatory Visit: Payer: Self-pay | Admitting: Family Medicine

## 2020-12-21 ENCOUNTER — Other Ambulatory Visit: Payer: Self-pay | Admitting: Family Medicine

## 2021-01-09 ENCOUNTER — Other Ambulatory Visit: Payer: Self-pay | Admitting: Family Medicine

## 2021-01-22 ENCOUNTER — Ambulatory Visit: Payer: Medicare HMO | Admitting: Family Medicine

## 2021-01-23 ENCOUNTER — Ambulatory Visit: Payer: Medicare HMO | Admitting: Family Medicine

## 2021-01-23 NOTE — Progress Notes (Deleted)
    SUBJECTIVE:   CHIEF COMPLAINT / HPI:   Depression  Blood in Stool    PERTINENT  PMH / PSH: ***  OBJECTIVE:   LMP 12/01/2001   ***  ASSESSMENT/PLAN:   No problem-specific Assessment & Plan notes found for this encounter.     [name], MD [place]   {    This will disappear when note is signed, click to select method of visit    :1}

## 2021-01-29 ENCOUNTER — Ambulatory Visit: Payer: Medicare HMO | Admitting: Family Medicine

## 2021-01-29 NOTE — Progress Notes (Deleted)
    SUBJECTIVE:   CHIEF COMPLAINT / HPI:   Depression  Blood in Stool    PERTINENT  PMH / PSH: ***  OBJECTIVE:   LMP 12/01/2001   ***  ASSESSMENT/PLAN:   No problem-specific Assessment & Plan notes found for this encounter.     [name], MD [place]   {    This will disappear when note is signed, click to select method of visit    :1}

## 2021-02-05 ENCOUNTER — Other Ambulatory Visit: Payer: Self-pay

## 2021-02-05 ENCOUNTER — Encounter: Payer: Self-pay | Admitting: Family Medicine

## 2021-02-05 ENCOUNTER — Ambulatory Visit (INDEPENDENT_AMBULATORY_CARE_PROVIDER_SITE_OTHER): Payer: Medicare HMO | Admitting: Family Medicine

## 2021-02-05 DIAGNOSIS — F3289 Other specified depressive episodes: Secondary | ICD-10-CM

## 2021-02-05 DIAGNOSIS — I10 Essential (primary) hypertension: Secondary | ICD-10-CM | POA: Diagnosis not present

## 2021-02-05 DIAGNOSIS — M5386 Other specified dorsopathies, lumbar region: Secondary | ICD-10-CM

## 2021-02-05 MED ORDER — HYDROXYZINE HCL 25 MG PO TABS
ORAL_TABLET | ORAL | 1 refills | Status: DC
Start: 2021-02-05 — End: 2021-04-02

## 2021-02-05 MED ORDER — SERTRALINE HCL 25 MG PO TABS: 25.0000 mg | ORAL_TABLET | Freq: Every day | ORAL | 0 refills | Status: DC

## 2021-02-05 NOTE — Progress Notes (Signed)
    SUBJECTIVE:   CHIEF COMPLAINT / HPI:   ANXIETY Has been bad on and off.  Several recent deaths in family.  Takes prozac once daily and atarax several times a day.  Often sad but no suicidal ideation.  Has kept making appts but then canceling.  Feels she is ready to commit to regular visits now  BACK PAIN Some days better than others.  Using diclofenac, tramadol, hydrocodone intermittently.  Does not feel baclofen helps.  Unsure about gabapentin. No longer using cane.  May be interested in Physical Therapy     PERTINENT  PMH / PSH: Need to discuss blood in stool   OBJECTIVE:   BP (!) 149/90   Pulse 69   Wt 203 lb 9.6 oz (92.4 kg)   LMP 12/01/2001   SpO2 98%   BMI 34.41 kg/m   Psych:  Cognition and judgment appear intact. Alert, communicative  and cooperative with normal attention span and concentration. No apparent delusions, illusions, hallucinations Is tearful but recovers  ASSESSMENT/PLAN:   Essential hypertension BP Readings from Last 3 Encounters:  02/05/21 (!) 149/90  06/26/20 (!) 144/82  06/13/20 137/70   Elevated today.  Need to address further once mood is improved  Sciatica associated with disorder of lumbar spine Stable but continues pain.  Suggested using tylenol once a day, then diclofenac and then tramadol.  Will not refill oxycodone right now until mood is improved and she follows up regularly  Depression Worsened.  Will wean off prozac and start sertraline and continue as needed atarax.  Goal is to only use atarax infrequently.  If not improving will refer to psychiatry.  She prefers this approach      [name], MD [place]

## 2021-02-05 NOTE — Assessment & Plan Note (Signed)
BP Readings from Last 3 Encounters:  02/05/21 (!) 149/90  06/26/20 (!) 144/82  06/13/20 137/70   Elevated today.  Need to address further once mood is improved

## 2021-02-05 NOTE — Patient Instructions (Addendum)
Good to see you today - Thank you for coming in  Things we discussed today:  [place] - Wean off the prozac - take one every second for one week then stop  After three days start the Zoloft one tab a day for one week the go up to 2 tabs a day  Please always bring your medication bottles  Come back to see me in one month

## 2021-02-05 NOTE — Assessment & Plan Note (Signed)
Worsened.  Will wean off prozac and start sertraline and continue as needed atarax.  Goal is to only use atarax infrequently.  If not improving will refer to psychiatry.  She prefers this approach

## 2021-02-05 NOTE — Assessment & Plan Note (Signed)
Stable but continues pain.  Suggested using tylenol once a day, then diclofenac and then tramadol.  Will not refill oxycodone right now until mood is improved and she follows up regularly

## 2021-02-06 ENCOUNTER — Other Ambulatory Visit: Payer: Self-pay | Admitting: Family Medicine

## 2021-02-18 ENCOUNTER — Other Ambulatory Visit: Payer: Self-pay | Admitting: Family Medicine

## 2021-02-27 DIAGNOSIS — H524 Presbyopia: Secondary | ICD-10-CM | POA: Diagnosis not present

## 2021-02-28 ENCOUNTER — Other Ambulatory Visit: Payer: Self-pay | Admitting: Family Medicine

## 2021-03-05 ENCOUNTER — Other Ambulatory Visit: Payer: Self-pay

## 2021-03-05 ENCOUNTER — Encounter: Payer: Self-pay | Admitting: Family Medicine

## 2021-03-05 ENCOUNTER — Ambulatory Visit (INDEPENDENT_AMBULATORY_CARE_PROVIDER_SITE_OTHER): Payer: Medicare HMO | Admitting: Family Medicine

## 2021-03-05 DIAGNOSIS — M5386 Other specified dorsopathies, lumbar region: Secondary | ICD-10-CM

## 2021-03-05 DIAGNOSIS — K921 Melena: Secondary | ICD-10-CM | POA: Diagnosis not present

## 2021-03-05 DIAGNOSIS — I10 Essential (primary) hypertension: Secondary | ICD-10-CM | POA: Diagnosis not present

## 2021-03-05 DIAGNOSIS — F321 Major depressive disorder, single episode, moderate: Secondary | ICD-10-CM

## 2021-03-05 NOTE — Assessment & Plan Note (Signed)
Re consulted GI as patient feels she was never called since last referral in August.  No signs of anemia.  Last cbc in August was normal

## 2021-03-05 NOTE — Assessment & Plan Note (Signed)
Elevated today but she is in mild pain and would like to continue to work on her weight

## 2021-03-05 NOTE — Progress Notes (Signed)
    SUBJECTIVE:   CHIEF COMPLAINT / HPI:   ANXIETY DEPRESSION Feels is much improved.  She is more active and feels happier.  Is completely off prozac and taking sertraline 25 mg daily.  Uses atarax several times a week   BACK PAIN Is chronic with intermittent flares.  Feels she is having mor R sided spasms.  No new weakness or incontinence.  Taking gabapentin and tramadol as needed. Does not feel baclofen helps.  Would like to try Physical Therapy  BLOOD IN STOOL Had mild intermittent blood on TP.  No lightheadness or exertional chest pain   PERTINENT  PMH / PSH: has 2 sets of twin grandchildren   OBJECTIVE:   BP 130/80   Pulse 66   Wt 205 lb 3.2 oz (93.1 kg)   LMP 12/01/2001   SpO2 97%   BMI 34.68 kg/m   Heart - Regular rate and rhythm.  No murmurs, gallops or rubs.    Lungs:  Normal respiratory effort, chest expands symmetrically. Lungs are clear to auscultation, no crackles or wheezes. Extremities:  No cyanosis, edema, or deformity noted with good range of motion of all major joints.     ASSESSMENT/PLAN:   Essential hypertension Elevated today but she is in mild pain and would like to continue to work on her weight   Sciatica associated with disorder of lumbar spine Worsened.  No red flag symptoms.  Will refer to Physical Therapy.  Continue as needed analgesics.  Is not taking oxycodone   Blood in stool Re consulted GI as patient feels she was never called since last referral in August.  No signs of anemia.  Last cbc in August was normal   Depression Improved.  Continue low dose sertraline and physical activity    Chest Wall Pain Having tender area over sternum diffusely.  Pain reproduced with palpation.  No pain with exertion or shortness of breath.  Try topical NSAID   [name], MD [place]

## 2021-03-05 NOTE — Patient Instructions (Addendum)
Good to see you today - Thank you for coming in  Things we discussed today:  Continue the medications as you  Try stretching exercises every morning I put in a referral to Physical Therapy.  They should contact you.  Let me know if yiou don't hear from them  Use Voltaren gel for the chest pain  Congrats on the weight loss   You need a colonoscopy to prevent colon cancer.  I have placed a referral to the gastroenterologist's office.  They should call you within two weeks.  If they do not please let me know  You need a mammogram to prevent breast cancer.  Please schedule an appointment.  You can call [phone].    I will check about the referral for colonscopy   Please always bring your medication bottles  Come back to see me in 3 months

## 2021-03-05 NOTE — Assessment & Plan Note (Signed)
Worsened.  No red flag symptoms.  Will refer to Physical Therapy.  Continue as needed analgesics.  Is not taking oxycodone

## 2021-03-05 NOTE — Assessment & Plan Note (Signed)
Improved.  Continue low dose sertraline and physical activity

## 2021-03-27 ENCOUNTER — Other Ambulatory Visit: Payer: Self-pay | Admitting: Family Medicine

## 2021-04-01 ENCOUNTER — Other Ambulatory Visit: Payer: Self-pay | Admitting: Family Medicine

## 2021-04-01 ENCOUNTER — Encounter: Payer: Self-pay | Admitting: Physical Therapy

## 2021-04-01 ENCOUNTER — Other Ambulatory Visit: Payer: Self-pay

## 2021-04-01 ENCOUNTER — Ambulatory Visit: Payer: Medicare HMO | Attending: Family Medicine | Admitting: Physical Therapy

## 2021-04-01 DIAGNOSIS — R2689 Other abnormalities of gait and mobility: Secondary | ICD-10-CM | POA: Insufficient documentation

## 2021-04-01 DIAGNOSIS — M25551 Pain in right hip: Secondary | ICD-10-CM | POA: Diagnosis not present

## 2021-04-01 DIAGNOSIS — G8929 Other chronic pain: Secondary | ICD-10-CM

## 2021-04-01 DIAGNOSIS — M6281 Muscle weakness (generalized): Secondary | ICD-10-CM | POA: Diagnosis not present

## 2021-04-01 DIAGNOSIS — M545 Low back pain, unspecified: Secondary | ICD-10-CM | POA: Diagnosis not present

## 2021-04-01 NOTE — Therapy (Signed)
[place], [place], [address] Phone: [phone]   Fax:  [phone]  Physical Therapy Evaluation  Patient Details  Name: Latoya Swanson MRN: [id number] Date of Birth: Mar 23, 1951 Referring Provider (PT): [name], MD   Encounter Date: 04/01/2021   PT End of Session - 04/01/21 1145    Visit Number 1    Number of Visits 16    Date for PT Re-Evaluation 05/27/21    Authorization Type [employer] MCR    Progress Note Due on Visit 10    PT Start Time 1130    PT Stop Time 1215    PT Time Calculation (min) 45 min    Activity Tolerance Patient tolerated treatment well    Behavior During Therapy [facility] for tasks assessed/performed           Past Medical History:  Diagnosis Date  . Alcoholism ([place])   . Anxiety   . Arthritis   . Cirrhosis ([place])   . Colon polyps   . Depression   . GERD (gastroesophageal reflux disease)   . Hepatitis C    treated and cured  . History of bleeding peptic ulcer   . 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  [place] Kitchen Hypertension   . Normal cardiac stress test 2008    [name]  . Normal echocardiogram 08/19/2007    Past Surgical History:  Procedure Laterality Date  . ANAL FISSURE REPAIR    . CARPAL TUNNEL RELEASE Bilateral   . DILATION AND CURETTAGE OF UTERUS     ectopic pregnancy  . ROTATOR CUFF REPAIR Right    x 2  . TUBAL LIGATION      There were no vitals filed for this visit.    Subjective Assessment - 04/01/21 1133    Subjective Patient reports some problem with balance and right sided lower back/hip pain that she feels is due to the muscles. This has been going on for about 2 years now, she stayed at home a lot due to [place] and feels she stopped normal activites. Patient feels mainly limited with walking and stairs, and she used to garden a lot but unable to now. Patient notes that she has a history of sciatica but this feels  different. She also reports she has a lot of shoulder tension around upper trap region.    Limitations Standing;Walking;House hold activities;Sitting;Lifting    How [place] can you sit comfortably? 1 hour    How [place] can you stand comfortably? 30 minutes    How [place] can you walk comfortably? 15 minutes - will get pulling pain in right lower back/hip    Patient Stated Goals Improve walking ability and activity level to get back to gardening    Currently in Pain? Yes    Pain Score 4     Pain Location Back    Pain Orientation Right;Lower    Pain Descriptors / Indicators Tightness   pulling   Pain Type Chronic pain    Pain Radiating Towards right hip region    Pain Onset More than a month ago    Pain Frequency Constant    Aggravating Factors  Walking, standing, sudden twisting movement    Pain Relieving Factors Stop current activity and rest, massage, hot tub with epson salt, ointments              [facility] PT Assessment - 04/01/21 0001  Assessment   Medical Diagnosis Sciatica associated with disorder of lumbar spine    Referring Provider (PT) [name], MD    Onset Date/Surgical Date --   patient reports 2 year history   Next MD Visit Not scheduled    Prior Therapy None      Precautions   Precautions Fall    Precaution Comments recent falls and patient reports feeling of inbalance      Restrictions   Weight Bearing Restrictions No      Balance Screen   Has the patient fallen in the past 6 months Yes    How many times? Most recently when she got up abruptly and go to take off    Has the patient had a decrease in activity level because of a fear of falling?  No    Is the patient reluctant to leave their home because of a fear of falling?  No      Home Environment   Living Environment Private residence    Living Arrangements Spouse/significant other    Type of [place] to enter   back door   Entrance Stairs-Number of Steps 14    Entrance  Stairs-Rails Right;Left;Can reach both    [place] One level      Prior Function   Level of Independence Independent    Leisure Walking for exercise, gardening      Cognition   Overall Cognitive Status Within Functional Limits for tasks assessed      Observation/Other Assessments   Observations Patient appears in no apparent distress    Focus on Therapeutic Outcomes (FOTO)  45% functional status      Sensation   Light Touch Appears Intact      Coordination   Gross Motor Movements are Fluid and Coordinated Yes      Posture/Postural Control   Posture Comments Patient exhibits increased lumbar lordosis      ROM / Strength   AROM / PROM / Strength AROM;Strength;PROM      AROM   Overall AROM Comments No clear lumbar directional preference    AROM Assessment Site Lumbar    Lumbar Flexion 90% - fingertips to ankles    Lumbar Extension 50% - discomfort across lower back    Lumbar - Right Side Bend 75% - right sided discomfort with right hip and hamstring discomfort    Lumbar - Left Side Bend WFL    Lumbar - Right Rotation 75% - low back tightness    Lumbar - Left Rotation 75% - low back tightness      PROM   Overall PROM Comments Left hip PROM grossly WFL, increased low back discomfort at end range left hip flexion    PROM Assessment Site Hip    Right/Left Hip Right    Right Hip Extension 0   patient reports increased right low back and hip pain   Right Hip Flexion 100   patient reports low back and groin pain   Right Hip External Rotation  25   patient reports increased right hip/gluteal pain   Right Hip Internal Rotation  25   slight increase right groin pain     Strength   Strength Assessment Site Hip;Knee    Right/Left Hip Right;Left    Right Hip Flexion 3/5    Right Hip Extension 3-/5    Right Hip ABduction 3-/5    Left Hip Flexion 4-/5    Left Hip Extension 4-/5  Left Hip ABduction 4/5    Right/Left Knee Right;Left    Right Knee Flexion 4+/5    Right Knee  Extension 4+/5    Left Knee Flexion 5/5    Left Knee Extension 5/5      Flexibility   Soft Tissue Assessment /Muscle Length yes    Hamstrings Limited to ~45 deg on right - unclear whether hamstring tightness pain or radicular pain    Quadriceps Limited to ~ 90 prone knee bend on right - patient with significant increase in right lower back and hip pain    Piriformis Unable to assess due to hip restriction and patient reporting spasm in gluteal region with assessment      Palpation   Spinal mobility Not assessed    Palpation comment TTP right lumbar paraspinals, right posterior gluteal region, right quad with increased muscular tension noted throughout      Special Tests    Special Tests Lumbar    Lumbar Tests Straight Leg Raise      Straight Leg Raise   Findings Positive    Comment unclear whether due to hamstring pain or radicular symptoms      Transfers   Transfers Independent with all Transfers      Ambulation/Gait   Ambulation/Gait Yes    Ambulation/Gait Assistance 7: Independent    Gait Comments Antalgic on right                      Objective measurements completed on examination: See above findings.       [place] Adult PT Treatment/Exercise - 04/01/21 0001      Exercises   Exercises Lumbar      Lumbar Exercises: Stretches   Lower Trunk Rotation 5 reps   5 sec hold     Lumbar Exercises: Supine   Pelvic Tilt 5 reps   3 sec hold     Lumbar Exercises: Sidelying   Clam 5 reps   2 sets     Lumbar Exercises: Prone   Straight Leg Raise 5 reps    Straight Leg Raises Limitations 2 pillows under pelvis, unable to achieve neutral hip extension      Lumbar Exercises: Quadruped   Madcat/Old Horse 5 reps    Madcat/Old Horse Limitations cueing for proper lumbopelvic control                  PT Education - 04/01/21 1144    Education Details Exam findings, POC, HEP    Person(s) Educated Patient    Methods Explanation;Demonstration;Tactile  cues;Verbal cues;Handout    Comprehension Verbalized understanding;Returned demonstration;Verbal cues required;Tactile cues required;Need further instruction            PT Short Term Goals - 04/01/21 1404      PT SHORT TERM GOAL #1   Title Patient will be I with initial HEP to progress with PT    Baseline provided HEP at eval    Time 4    Period Weeks    Status New    Target Date 04/29/21      PT SHORT TERM GOAL #2   Title PT will review FOTO results with patient for progress expectations    Baseline intake at eval    Time 3    Period Weeks    Status New    Target Date 04/22/21      PT SHORT TERM GOAL #3   Title Patient will report </= 2/10 pain of right lower back and  hip to improve functional mobility    Baseline 4/10 pain level    Time 4    Period Weeks    Status New    Target Date 04/29/21      PT SHORT TERM GOAL #4   Title Patient will be able to ambulate >/= 20 minutes prior to requiring rest break to improve community access    Baseline 15 minutes    Time 4    Period Weeks    Status New    Target Date 04/29/21             PT [place] Term Goals - 04/01/21 1413      PT [place] TERM GOAL #1   Title Patient will be I with final HEP to maintain progress from PT    Baseline provided initial HEP at eval    Time 8    Period Weeks    Status New    Target Date 05/27/21      PT [place] TERM GOAL #2   Title Patient will report >/= 56% functional status on FOTO    Baseline 45% functional status    Time 8    Period Weeks    Status New    Target Date 05/27/21      PT [place] TERM GOAL #3   Title Patient will demonstrate improved right hip strength grossly >/= 4-/5 MMT to improve walking ability    Baseline right hip strength grossly 3/5 MMT    Time 8    Period Weeks    Status New    Target Date 05/27/21      PT [place] TERM GOAL #4   Title Patient will demonstrate right hip PROM grossly WFL and non-painful to improve gardening ability    Baseline right hip flexion,  extension, and ER PROM limitations at eval    Time 8    Period Weeks    Status New    Target Date 05/27/21      PT [place] TERM GOAL #5   Title Patient will be able to walk >/= 45 minutes to improve ability to exercise    Baseline 15 minutes at eval    Time 8    Period Weeks    Status New    Target Date 05/27/21                  Plan - 04/01/21 1145    Clinical Impression Statement Patient presents to PT with report of chronic right sided lower back and hip pain. Patient symptoms seem majority muscular in nature but also could had radicular symptom contirbution without a clear direction preference, and with possible hip intraarticular symptoms. Patient exhibits slight limitations in lumbar motion with greater limitation with right hip motion and significant right hip strength deficit. She exhibits lordotic posture with limitation in quad flexibility on right that also increased low back and hip pain with testing. Patient also TTP with increased muscular tension for right lumbar paraspinals, gluteal region, and right quad region. Patient demonstrated moderate-high irritability when attempting to perform right hip stretching this visit so focused majority of HEP on core/hip activation exercises within a tolerable range. Patient would benefit from continued skilled PT to progress lumbar motion, right hip motion and strength, and reduce pain to improve walking ability and return to previous activities such as exercise and gardening.    Personal Factors and Comorbidities Fitness;Past/Current Experience;Time since onset of injury/illness/exacerbation    Examination-Activity Limitations Locomotion Level;Sit;Squat;Stairs;Stand;Lift;Bend  Examination-Participation Restrictions Meal Prep;Cleaning;Community Activity;Driving;Shop;Yard Work    Stability/Clinical Decision Making Stable/Uncomplicated    Clinical Decision Making Low    Rehab Potential Good    PT Frequency 2x / week    PT Duration 8  weeks    PT Treatment/Interventions ADLs/Self Care Home Management;Aquatic Therapy;Cryotherapy;Electrical Stimulation;Iontophoresis 4mg /ml Dexamethasone;Moist Heat;Traction;Ultrasound;Neuromuscular re-education;Balance training;Therapeutic exercise;Therapeutic activities;Functional mobility training;Stair training;Gait training;Patient/family education;Manual techniques;Dry needling;Passive range of motion;Taping;Spinal Manipulations;Joint Manipulations    PT Next Visit Plan Review HEP and progress PRN, manual/STM for right quad/lumbar paraspinals/gluteals/piriformis/hamstring regions, trial LAD for right hip to reduce pain, stretching for right hip and low back as tolerated (hip flexor/quad, piriformis, SKTC, hamstring), progress right hip strengthening as tolerated, initiate balance training as tolerated    PT Home Exercise Plan [id number]    Consulted and Agree with Plan of Care Patient           Patient will benefit from skilled therapeutic intervention in order to improve the following deficits and impairments:  Abnormal gait,Decreased range of motion,Difficulty walking,Decreased activity tolerance,Pain,Decreased balance,Impaired flexibility,Increased muscle spasms,Postural dysfunction,Decreased strength  Visit Diagnosis: Chronic right-sided low back pain, unspecified whether sciatica present  Pain in right hip  Muscle weakness (generalized)  Other abnormalities of gait and mobility     Problem List Patient Active Problem List   Diagnosis Date Noted  . Blood in stool 06/13/2020  . Chronic pain syndrome 09/15/2017  . Sciatica associated with disorder of lumbar spine 09/01/2016  . Abnormal gall bladder diagnostic imaging 10/24/2015  . Hepatic cirrhosis ([place]) 07/18/2015  . Tobacco use 03/03/2012  . 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 Heart palpitations 05/29/2010  . Cervical radiculitis 06/29/2009  . OVERWEIGHT 03/08/2009  . CARPAL TUNNEL SYNDROME, LEFT 07/13/2008  . Depression  07/05/2007  . Essential hypertension 07/05/2007  . GERD 07/05/2007  . HYPERCHOLESTEROLEMIA, MILD 03/05/2007    Hilda Blades, PT, DPT, LAT, ATC 04/01/21  2:40 PM Phone: [phone] Fax: [facility] [address] [place], [place], [address] Phone: [phone]   Fax:  [phone]  Name: Latoya Swanson MRN: [id number] Date of Birth: [birth year]   Referring diagnosis? M53.86 Treatment diagnosis? (if different than referring diagnosis) M54.50 What was this (referring dx) caused by? []  Surgery []  Fall [x]  Ongoing issue []  Arthritis []  Other: ____________  Laterality: [x]  Rt []  Lt []  Both  Check all possible CPT codes:      []  97110 (Therapeutic Exercise)  []  92507 (SLP Treatment)  []  97112 (Neuro Re-ed)   []  92526 (Swallowing Treatment)   []  97116 (Gait Training)   []  [phone] (Cognitive Training, 1st 15 minutes) []  97140 (Manual Therapy)   []  97130 (Cognitive Training, each add'l 15 minutes)  []  97530 (Therapeutic Activities)  []  Other, List CPT Code ____________    []  [id number] (Self Care)       [x]  All codes above (97110 - 97535)  [x]  97012 (Mechanical Traction)  [x]  97014 (E-stim Unattended)  []  97032 (E-stim manual)  []  97033 (Ionto)  []  97035 (Ultrasound)  []  97760 (Orthotic Fit) []  [id number] (Physical Performance Training) [x]  [id number] (Aquatic Therapy) []  97034 (Contrast Bath) []  [id number] (Paraffin) []  97597 (Wound Care 1st 20 sq cm) []  97598 (Wound Care each add'l 20 sq cm) []  97016 (Vasopneumatic Device) []  [id number] Comptroller) []  [id number] (Prosthetic Training)

## 2021-04-01 NOTE — Patient Instructions (Signed)
Access Code: [email or web address] URL: [email or web address] Date: 04/01/2021 Prepared by: Hilda Blades  Exercises Supine Lower Trunk Rotation - 1-2 x daily - 7 x weekly - 2 sets - 5 reps - 5 hold Supine Pelvic Tilt - 1-2 x daily - 7 x weekly - 2 sets - 5 reps - 3 hold Clamshell - 1-2 x daily - 7 x weekly - 2 sets - 5 reps Prone Hip Extension - Two Pillows - 1-2 x daily - 7 x weekly - 2 sets - 5 reps Cat-Camel - 1-2 x daily - 7 x weekly - 2 sets - 5 reps

## 2021-04-03 ENCOUNTER — Encounter: Payer: Self-pay | Admitting: Gastroenterology

## 2021-04-04 ENCOUNTER — Other Ambulatory Visit: Payer: Self-pay

## 2021-04-04 ENCOUNTER — Encounter: Payer: Self-pay | Admitting: Physical Therapy

## 2021-04-04 ENCOUNTER — Ambulatory Visit: Payer: Medicare HMO | Admitting: Physical Therapy

## 2021-04-04 DIAGNOSIS — M6281 Muscle weakness (generalized): Secondary | ICD-10-CM | POA: Diagnosis not present

## 2021-04-04 DIAGNOSIS — M545 Low back pain, unspecified: Secondary | ICD-10-CM | POA: Diagnosis not present

## 2021-04-04 DIAGNOSIS — R2689 Other abnormalities of gait and mobility: Secondary | ICD-10-CM | POA: Diagnosis not present

## 2021-04-04 DIAGNOSIS — M25551 Pain in right hip: Secondary | ICD-10-CM | POA: Diagnosis not present

## 2021-04-04 DIAGNOSIS — G8929 Other chronic pain: Secondary | ICD-10-CM | POA: Diagnosis not present

## 2021-04-04 NOTE — Patient Instructions (Signed)
Access Code: [email or web address] URL: [email or web address] Date: 04/04/2021 Prepared by: Hilda Blades  Exercises Supine Lower Trunk Rotation - 2-3 x daily - 7 x weekly - 2 sets - 5 reps - 5 hold Hook Lying Single Knee to Chest Stretch with Towel - 2-3 x daily - 7 x weekly - 2 reps - 30 hold Modified Thomas Stretch - 2-3 x daily - 7 x weekly - 2 reps - 60 hold Supine Pelvic Tilt - 2-3 x daily - 7 x weekly - 2 sets - 5 reps - 3 hold Clamshell - 2-3 x daily - 7 x weekly - 2 sets - 5 reps Prone Hip Extension - Two Pillows - 2-3 x daily - 7 x weekly - 2 sets - 5 reps Cat-Camel - 2-3 x daily - 7 x weekly - 2 sets - 5 reps Standing Forward Trunk Flexion - 2-3 x daily - 7 x weekly - 5-10 reps

## 2021-04-04 NOTE — Therapy (Signed)
[place], [place], [address] Phone: [phone]   Fax:  [phone]  Physical Therapy Treatment  Patient Details  Name: Latoya Swanson MRN: [id number] Date of Birth: 12-13-1950 Referring Provider (PT): [name], MD   Encounter Date: 04/04/2021   PT End of Session - 04/04/21 1216    Visit Number 2    Number of Visits 16    Date for PT Re-Evaluation 05/27/21    Authorization Type [employer] MCR    Authorization Time Period 04/04/21 - 06/15/21    Authorization - Visit Number 1    Authorization - Number of Visits 16    Progress Note Due on Visit 10    PT Start Time 1215    PT Stop Time 1258    PT Time Calculation (min) 43 min    Activity Tolerance Patient tolerated treatment well    Behavior During Therapy [facility] for tasks assessed/performed           Past Medical History:  Diagnosis Date  . Alcoholism ([place])   . Anxiety   . Arthritis   . Cirrhosis ([place])   . Colon polyps   . Depression   . GERD (gastroesophageal reflux disease)   . Hepatitis C    treated and cured  . History of bleeding peptic ulcer   . 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  Marland Kitchen Hypertension   . Normal cardiac stress test 2008    [name]  . Normal echocardiogram 08/19/2007    Past Surgical History:  Procedure Laterality Date  . ANAL FISSURE REPAIR    . CARPAL TUNNEL RELEASE Bilateral   . DILATION AND CURETTAGE OF UTERUS     ectopic pregnancy  . ROTATOR CUFF REPAIR Right    x 2  . TUBAL LIGATION      There were no vitals filed for this visit.   Subjective Assessment - 04/04/21 1220    Subjective Patient reports she has been doing her exercises 3x/day, they make her a little sore but she is able to take a hot bath with epson salt and that helps, and she has been sleeping better. Patient notes that while working in sink or with plants, she has to bend forward to relieve low back  pain. Patient also reports that bending forward helps more with her back and hip pain.    Patient Stated Goals Improve walking ability and activity level to get back to gardening    Currently in Pain? Yes    Pain Score 5     Pain Location Back    Pain Orientation Right;Lower    Pain Descriptors / Indicators --   "pulling"   Pain Type Chronic pain    Pain Radiating Towards right lower back and hip region    Pain Onset More than a month ago    Pain Frequency Constant              [facility] PT Assessment - 04/04/21 0001      AROM   Lumbar Flexion WFL - patient reports improvement in symptoms with repeated flexion                         [facility] Adult PT Treatment/Exercise - 04/04/21 0001      Exercises   Exercises Lumbar      Lumbar Exercises: Stretches   Single Knee to Chest Stretch  2 reps;30 seconds    Single Knee to Chest Stretch Limitations reqired towel behind thigh on right    Hip Flexor Stretch 2 reps;60 seconds    Hip Flexor Stretch Limitations modified thomas edge of mat with manual stretch    Other Lumbar Stretch Exercise Seated lumbar flexion physioball roll out 5 x 10 sec    Other Lumbar Stretch Exercise Standing lumbar flexion x 10      Lumbar Exercises: Aerobic   Nustep L5 x 5 min with LE while taking subjective      Lumbar Exercises: Supine   Pelvic Tilt 5 reps;5 seconds   2 sets     Manual Therapy   Manual Therapy Joint mobilization;Soft tissue mobilization    Joint Mobilization Right LAD using belt with gentle oscillations x 4 bouts    Soft tissue mobilization STM to right lower back, posterior and lateral hip region with FR while patient sidelying; STM to right quad using roller with patient in modified thomas position                  PT Education - 04/04/21 1216    Education Details HEP update, FOTO review    Person(s) Educated Patient    Methods Explanation;Demonstration;Tactile cues;Verbal cues;Handout    Comprehension  Verbalized understanding;Need further instruction;Returned demonstration;Verbal cues required;Tactile cues required            PT Short Term Goals - 04/04/21 1222      PT SHORT TERM GOAL #1   Title Patient will be I with initial HEP to progress with PT    Baseline progressing HEP    Time 4    Period Weeks    Status On-going    Target Date 04/29/21      PT SHORT TERM GOAL #2   Title PT will review FOTO results with patient for progress expectations    Baseline reviewed on 2nd visit    Time 3    Period Weeks    Status Achieved    Target Date 04/22/21      PT SHORT TERM GOAL #3   Title Patient will report </= 2/10 pain of right lower back and hip to improve functional mobility    Baseline 4/10 pain level    Time 4    Period Weeks    Status New    Target Date 04/29/21      PT SHORT TERM GOAL #4   Title Patient will be able to ambulate >/= 20 minutes prior to requiring rest break to improve community access    Baseline 15 minutes    Time 4    Period Weeks    Status New    Target Date 04/29/21             PT Mccanless Term Goals - 04/01/21 1413      PT Tippins TERM GOAL #1   Title Patient will be I with final HEP to maintain progress from PT    Baseline provided initial HEP at eval    Time 8    Period Weeks    Status New    Target Date 05/27/21      PT Jacu[place] TERM GOAL #2   Title Patient will report >/= 56% functional status on FOTO    Baseline 45% functional status    Time 8    Period Weeks    Status New    Target Date 05/27/21      PT Linquist TERM GOAL #3  Title Patient will demonstrate improved right hip strength grossly >/= 4-/5 MMT to improve walking ability    Baseline right hip strength grossly 3/5 MMT    Time 8    Period Weeks    Status New    Target Date 05/27/21      PT Rogoff TERM GOAL #4   Title Patient will demonstrate right hip PROM grossly WFL and non-painful to improve gardening ability    Baseline right hip flexion, extension, and ER PROM  limitations at eval    Time 8    Period Weeks    Status New    Target Date 05/27/21      PT Folse TERM GOAL #5   Title Patient will be able to walk >/= 45 minutes to improve ability to exercise    Baseline 15 minutes at eval    Time 8    Period Weeks    Status New    Target Date 05/27/21                 Plan - 04/04/21 1216    Clinical Impression Statement Patient tolerated therapy well with no adverse effects. Based on patient's explanation of recent symptoms her pain seems more lumbar stenosis related as she has pain standing and feels better in a flexed posture, so therapy focused on lumbar flexion exercises with repeated movements and stretchs, and improving posture. Incorporated stretches into HEP and instructed patient on using lumbar flexion to improve symptoms of standing for extended periods. Patient would benefit from continued skilled PT to progress lumbar motion, right hip motion and strength, and reduce pain to improve walking ability and return to previous activities such as exercise and gardening.    PT Treatment/Interventions ADLs/Self Care Home Management;Aquatic Therapy;Cryotherapy;Electrical Stimulation;Iontophoresis 4mg /ml Dexamethasone;Moist Heat;Traction;Ultrasound;Neuromuscular re-education;Balance training;Therapeutic exercise;Therapeutic activities;Functional mobility training;Stair training;Gait training;Patient/family education;Manual techniques;Dry needling;Passive range of motion;Taping;Spinal Manipulations;Joint Manipulations    PT Next Visit Plan Review HEP and progress PRN, manual/STM for right quad/lumbar paraspinals/gluteals/piriformis/hamstring regions, LAD for right LE, stretching for right hip and low back as tolerated (hip flexor/quad, piriformis, SKTC, hamstring), progress right hip strengthening as tolerated, initiate balance training as tolerated    PT Home Exercise Plan [email or web address]    Consulted and Agree with Plan of Care Patient            Patient will benefit from skilled therapeutic intervention in order to improve the following deficits and impairments:  Abnormal gait,Decreased range of motion,Difficulty walking,Decreased activity tolerance,Pain,Decreased balance,Impaired flexibility,Increased muscle spasms,Postural dysfunction,Decreased strength  Visit Diagnosis: Chronic right-sided low back pain, unspecified whether sciatica present  Pain in right hip  Muscle weakness (generalized)  Other abnormalities of gait and mobility     Problem List Patient Active Problem List   Diagnosis Date Noted  . Blood in stool 06/13/2020  . Chronic pain syndrome 09/15/2017  . Sciatica associated with disorder of lumbar spine 09/01/2016  . Abnormal gall bladder diagnostic imaging 10/24/2015  . Hepatic cirrhosis ([place]) 07/18/2015  . Tobacco use 03/03/2012  . 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 Heart palpitations 05/29/2010  . Cervical radiculitis 06/29/2009  . OVERWEIGHT 03/08/2009  . CARPAL TUNNEL SYNDROME, LEFT 07/13/2008  . Depression 07/05/2007  . Essential hypertension 07/05/2007  . GERD 07/05/2007  . HYPERCHOLESTEROLEMIA, MILD 03/05/2007    Hilda Blades, PT, DPT, LAT, ATC 04/04/21  1:18 PM Phone: [phone] Fax: [place] [facility] [address] [place], [place], [address] Phone: [phone]   Fax:  [phone]  Name: Latoya Swanson MRN: [id number] Date of Birth: 1950/12/13

## 2021-04-11 ENCOUNTER — Ambulatory Visit: Payer: Medicare HMO

## 2021-04-15 ENCOUNTER — Other Ambulatory Visit: Payer: Self-pay

## 2021-04-15 ENCOUNTER — Ambulatory Visit: Payer: Medicare HMO | Attending: Family Medicine | Admitting: Physical Therapy

## 2021-04-15 ENCOUNTER — Encounter: Payer: Self-pay | Admitting: Physical Therapy

## 2021-04-15 DIAGNOSIS — M545 Low back pain, unspecified: Secondary | ICD-10-CM | POA: Diagnosis not present

## 2021-04-15 DIAGNOSIS — M25551 Pain in right hip: Secondary | ICD-10-CM | POA: Diagnosis not present

## 2021-04-15 DIAGNOSIS — G8929 Other chronic pain: Secondary | ICD-10-CM

## 2021-04-15 DIAGNOSIS — M6281 Muscle weakness (generalized): Secondary | ICD-10-CM | POA: Diagnosis not present

## 2021-04-15 DIAGNOSIS — R2689 Other abnormalities of gait and mobility: Secondary | ICD-10-CM

## 2021-04-15 NOTE — Patient Instructions (Signed)
Access Code: [email or web address] URL: [email or web address] Date: 04/15/2021 Prepared by: Hilda Blades  Exercises Supine Lower Trunk Rotation - 2-3 x daily - 7 x weekly - 2 sets - 5 reps - 5 hold Hook Lying Single Knee to Chest Stretch with Towel - 2-3 x daily - 7 x weekly - 2 reps - 30 hold Supine Piriformis Stretch with Towel - 2-3 x daily - 7 x weekly - 2 reps - 30 hold Supine Knees to Chest with Swiss Ball - 2-3 x daily - 7 x weekly - 2 sets - 5 reps - 10 hold Modified Thomas Stretch - 2-3 x daily - 7 x weekly - 2 reps - 60 hold Supine Pelvic Tilt - 2-3 x daily - 7 x weekly - 2 sets - 5 reps - 3 hold Hooklying Clamshell with Resistance - 1 x daily - 7 x weekly - 2 sets - 10 reps - 3 hold Clamshell - 2-3 x daily - 7 x weekly - 2 sets - 5 reps Prone Hip Extension - Two Pillows - 2-3 x daily - 7 x weekly - 2 sets - 5 reps Cat-Camel - 2-3 x daily - 7 x weekly - 2 sets - 5 reps Standing Forward Trunk Flexion - 2-3 x daily - 7 x weekly - 5-10 reps

## 2021-04-15 NOTE — Therapy (Signed)
[place], [place], [address] Phone: [phone]   Fax:  [phone]  Physical Therapy Treatment  Patient Details  Name: Latoya Swanson MRN: [id number] Date of Birth: 05-04-1951 Referring Provider (Latoya Swanson): Latoya Covert, MD   Encounter Date: 04/15/2021   Latoya Swanson End of Session - 04/15/21 1230    Visit Number 3    Number of Visits 16    Date for Latoya Swanson Re-Evaluation 05/27/21    Authorization Type [employer] MCR    Authorization Time Period 04/04/21 - 06/15/21    Authorization - Visit Number 2    Authorization - Number of Visits 16    Progress Note Due on Visit 10    Latoya Swanson Start Time 1222    Latoya Swanson Stop Time 1300    Latoya Swanson Time Calculation (min) 38 min    Activity Tolerance Patient tolerated treatment well    Behavior During Therapy [name] for tasks assessed/performed           Past Medical History:  Diagnosis Date  . Alcoholism ([place])   . Anxiety   . Arthritis   . Cirrhosis ([name])   . Colon polyps   . Depression   . GERD (gastroesophageal reflux disease)   . Hepatitis C    treated and cured  . History of bleeding peptic ulcer   . 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  Marland Kitchen Hypertension   . Normal cardiac stress test 2008    [name]  . Normal echocardiogram 08/19/2007    Past Surgical History:  Procedure Laterality Date  . ANAL FISSURE REPAIR    . CARPAL TUNNEL RELEASE Bilateral   . DILATION AND CURETTAGE OF UTERUS     ectopic pregnancy  . ROTATOR CUFF REPAIR Right    x 2  . TUBAL LIGATION      There were no vitals filed for this visit.   Subjective Assessment - 04/15/21 1227    Subjective Patient reports she is not doing well emotionally due to sister being in hospital. States she has been walking more and is consistent with her exercises. States that her hip feels like it wants to lock up on her 10-15 minutes so she will use a cane. Patient reports that doing the stretches  where she bends forward helps to ease up the pain some.    Patient Stated Goals Improve walking ability and activity level to get back to gardening    Currently in Pain? Yes    Pain Score 6     Pain Location Back    Pain Orientation Right;Lower    Pain Descriptors / Indicators Tightness   pulling   Pain Type Chronic pain    Pain Onset More than a month ago    Pain Frequency Constant    Aggravating Factors  Walking              [facility] Latoya Swanson Assessment - 04/15/21 0001      PROM   Right Hip Flexion 110    Right Hip External Rotation  40                         [facility] Adult Latoya Swanson Treatment/Exercise - 04/15/21 0001      Exercises   Exercises Lumbar      Lumbar Exercises: Stretches   Single Knee to Chest Stretch 2 reps;30 seconds    Single Knee to Chest  Stretch Limitations supine PROM    Double Knee to Chest Stretch 5 reps;10 seconds    Double Knee to Chest Stretch Limitations supine with physioball    Piriformis Stretch 2 reps;30 seconds    Piriformis Stretch Limitations supine PROM    Other Lumbar Stretch Exercise Seated lumbar flexion physioball roll out 5 x 10 sec      Lumbar Exercises: Aerobic   Nustep L5 x 5 min with LE/UE while taking subjective      Lumbar Exercises: Supine   Pelvic Tilt 5 reps;5 seconds   2 sets   Clam 10 reps;3 seconds   2 sets   Clam Limitations red band      Manual Therapy   Manual Therapy Joint mobilization;Soft tissue mobilization    Joint Mobilization Right LAD using belt with gentle oscillations x 4 bouts    Soft tissue mobilization STM to right lower back, posterior and lateral hip region with FR while patient sidelying; STM to right quad using roller with patient in modified thomas position                  Latoya Swanson Education - 04/15/21 1229    Education Details HEP update    Person(s) Educated Patient    Methods Explanation;Demonstration;Verbal cues;Handout    Comprehension Verbalized understanding;Returned  demonstration;Verbal cues required;Need further instruction            Latoya Swanson Short Term Goals - 04/04/21 1222      Latoya Swanson SHORT TERM GOAL #1   Title Patient will be I with initial HEP to progress with Latoya Swanson    Baseline progressing HEP    Time 4    Period Weeks    Status On-going    Target Date 04/29/21      Latoya Swanson SHORT TERM GOAL #2   Title Latoya Swanson will review FOTO results with patient for progress expectations    Baseline reviewed on 2nd visit    Time 3    Period Weeks    Status Achieved    Target Date 04/22/21      Latoya Swanson SHORT TERM GOAL #3   Title Patient will report </= 2/10 pain of right lower back and hip to improve functional mobility    Baseline 4/10 pain level    Time 4    Period Weeks    Status New    Target Date 04/29/21      Latoya Swanson SHORT TERM GOAL #4   Title Patient will be able to ambulate >/= 20 minutes prior to requiring rest break to improve community access    Baseline 15 minutes    Time 4    Period Weeks    Status New    Target Date 04/29/21             Latoya Swanson [place] Term Goals - 04/01/21 1413      Latoya Swanson W[place] TERM GOAL #1   Title Patient will be I with final HEP to maintain progress from Latoya Swanson    Baseline provided initial HEP at eval    Time 8    Period Weeks    Status New    Target Date 05/27/21      Latoya Swanson Zuelke TERM GOAL #2   Title Patient will report >/= 56% functional status on FOTO    Baseline 45% functional status    Time 8    Period Weeks    Status New    Target Date 05/27/21      Latoya Swanson [place] TERM GOAL #  3   Title Patient will demonstrate improved right hip strength grossly >/= 4-/5 MMT to improve walking ability    Baseline right hip strength grossly 3/5 MMT    Time 8    Period Weeks    Status New    Target Date 05/27/21      Latoya Swanson Coy TERM GOAL #4   Title Patient will demonstrate right hip PROM grossly WFL and non-painful to improve gardening ability    Baseline right hip flexion, extension, and ER PROM limitations at eval    Time 8    Period Weeks    Status New     Target Date 05/27/21      Latoya Swanson Derderian TERM GOAL #5   Title Patient will be able to walk >/= 45 minutes to improve ability to exercise    Baseline 15 minutes at eval    Time 8    Period Weeks    Status New    Target Date 05/27/21                 Plan - 04/15/21 1230    Clinical Impression Statement Patient tolerated therapy well with no adverse effects. Patient arrived reporting slightly increased pain this visit due to having to walk more and emotional distress from having family in the hospital. Therapy focused on reducing patient's pain level and progressing stretching and light strengthening exercises. Patient did report greater tenderness around piriformis region this visit with STM so continued to attempt piriformis stretching as patient has not been able to tolerate this previously. Patient did exhibit improved hip PROM and was able to tolerate greater stretching this visit, and reported feeling looser post-therapy with less pain. Patient would benefit from continued skilled Latoya Swanson to progress lumbar motion, right hip motion and strength, and reduce pain to improve walking ability and return to previous activities such as exercise and gardening.    Latoya Swanson Treatment/Interventions ADLs/Self Care Home Management;Aquatic Therapy;Cryotherapy;Electrical Stimulation;Iontophoresis 4mg /ml Dexamethasone;Moist Heat;Traction;Ultrasound;Neuromuscular re-education;Balance training;Therapeutic exercise;Therapeutic activities;Functional mobility training;Stair training;Gait training;Patient/family education;Manual techniques;Dry needling;Passive range of motion;Taping;Spinal Manipulations;Joint Manipulations    Latoya Swanson Next Visit Plan Review HEP and progress PRN, manual/STM for right quad/lumbar paraspinals/gluteals/piriformis/hamstring regions, LAD for right LE, stretching for right hip and low back as tolerated (hip flexor/quad, piriformis, SKTC, hamstring), progress right hip strengthening as tolerated, initiate  balance training as tolerated    Latoya Swanson Home Exercise Plan [email or web address]    Consulted and Agree with Plan of Care Patient           Patient will benefit from skilled therapeutic intervention in order to improve the following deficits and impairments:  Abnormal gait,Decreased range of motion,Difficulty walking,Decreased activity tolerance,Pain,Decreased balance,Impaired flexibility,Increased muscle spasms,Postural dysfunction,Decreased strength  Visit Diagnosis: Chronic right-sided low back pain, unspecified whether sciatica present  Pain in right hip  Muscle weakness (generalized)  Other abnormalities of gait and mobility     Problem List Patient Active Problem List   Diagnosis Date Noted  . Blood in stool 06/13/2020  . Chronic pain syndrome 09/15/2017  . Sciatica associated with disorder of lumbar spine 09/01/2016  . Abnormal gall bladder diagnostic imaging 10/24/2015  . Hepatic cirrhosis ([place]) 07/18/2015  . Tobacco use 03/03/2012  . 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 Heart palpitations 05/29/2010  . Cervical radiculitis 06/29/2009  . OVERWEIGHT 03/08/2009  . CARPAL TUNNEL SYNDROME, LEFT 07/13/2008  . Depression 07/05/2007  . Essential hypertension 07/05/2007  . GERD 07/05/2007  . HYPERCHOLESTEROLEMIA, MILD 03/05/2007    Latoya Swanson, Latoya Swanson, Latoya Swanson, Latoya Swanson, Latoya Swanson  04/15/21  1:25 PM Phone: [phone] Fax: [place] [facility] [address] [place], [place], [address] Phone: [phone]   Fax:  [phone]  Name: Latoya Swanson MRN: [id number] Date of Birth: 1951-07-11

## 2021-04-17 ENCOUNTER — Other Ambulatory Visit: Payer: Self-pay

## 2021-04-17 ENCOUNTER — Ambulatory Visit: Payer: Medicare HMO | Admitting: Physical Therapy

## 2021-04-17 ENCOUNTER — Ambulatory Visit (INDEPENDENT_AMBULATORY_CARE_PROVIDER_SITE_OTHER): Payer: Medicare HMO | Admitting: Family Medicine

## 2021-04-17 DIAGNOSIS — N644 Mastodynia: Secondary | ICD-10-CM

## 2021-04-17 DIAGNOSIS — K921 Melena: Secondary | ICD-10-CM | POA: Diagnosis not present

## 2021-04-17 DIAGNOSIS — F321 Major depressive disorder, single episode, moderate: Secondary | ICD-10-CM | POA: Diagnosis not present

## 2021-04-17 DIAGNOSIS — I10 Essential (primary) hypertension: Secondary | ICD-10-CM

## 2021-04-17 NOTE — Progress Notes (Signed)
    SUBJECTIVE:   CHIEF COMPLAINT / HPI:   BREAST CHEST PAIN Having episodes of chest pain primarily located in her breasts.  They are tender to palpations all the tme now but seem to worsen when she is worried. No chest pain or shortness of breath with exertion.  Goes ot Physical Therapy does yard work and walks around with her cane without symptoms   ANXIETY DEPRESSION Despite her sister having a large unexpected CVA the end of May she feels she continues to be improved.    HYPERTENSION Taking her medications regularly.  No lightheadness or edema.  Has a cuff but did not bring   PERTINENT  PMH / PSH: No history of CAD   OBJECTIVE:   BP (!) 159/95   Pulse 80   Wt 200 lb 12.8 oz (91.1 kg)   LMP 12/01/2001   SpO2 98%   BMI 33.93 kg/m   Heart - Regular rate and rhythm.  No murmurs, gallops or rubs.    Lungs:  Normal respiratory effort, chest expands symmetrically. Lungs are clear to auscultation, no crackles or wheezes. Breast - tender diffusely to palpation.  No focal masses.  Palpation reproduces the pain she feels.  Is not tender over sternum or ribs  Extrem - no edema   ASSESSMENT/PLAN:   Essential hypertension Not controlled today.  Maybe due to current pain and stress from family illness.  Monitor at home   Blood in stool Scheduled to meet with GI   Depression Stable despite recent family illness   Breast pain Unsure of cause.  No signs of infection or cancer but she does need to follow up for a mammogram.   Pain is worse with stress but not with exertion.  Unlikely to be CAD.  Will monitor     [name], MD [place]

## 2021-04-17 NOTE — Assessment & Plan Note (Signed)
Stable despite recent family illness

## 2021-04-17 NOTE — Assessment & Plan Note (Signed)
Unsure of cause.  No signs of infection or cancer but she does need to follow up for a mammogram.   Pain is worse with stress but not with exertion.  Unlikely to be CAD.  Will monitor

## 2021-04-17 NOTE — Assessment & Plan Note (Signed)
Not controlled today.  Maybe due to current pain and stress from family illness.  Monitor at home

## 2021-04-17 NOTE — Assessment & Plan Note (Signed)
Scheduled to meet with GI

## 2021-04-17 NOTE — Patient Instructions (Signed)
Good to see you today - Thank you for coming in  Things we discussed today:  It is ok for you to have a mammogram  If you have chest pain with exertion that does not go away you need to go to the ER  If your chest pain lasts more than an hour without being tender you should call 911  Call me if your blood pressure is regularly over 160/100  Bring your blood pressure cuff next visit  Please always bring your medication bottles  Come back to see me in one month

## 2021-04-22 ENCOUNTER — Ambulatory Visit: Payer: Medicare HMO | Admitting: Physical Therapy

## 2021-04-24 ENCOUNTER — Ambulatory Visit: Payer: Medicare HMO | Admitting: Physical Therapy

## 2021-04-24 ENCOUNTER — Encounter: Payer: Self-pay | Admitting: Physical Therapy

## 2021-04-24 ENCOUNTER — Other Ambulatory Visit: Payer: Self-pay

## 2021-04-24 DIAGNOSIS — G8929 Other chronic pain: Secondary | ICD-10-CM | POA: Diagnosis not present

## 2021-04-24 DIAGNOSIS — M6281 Muscle weakness (generalized): Secondary | ICD-10-CM

## 2021-04-24 DIAGNOSIS — R2689 Other abnormalities of gait and mobility: Secondary | ICD-10-CM | POA: Diagnosis not present

## 2021-04-24 DIAGNOSIS — M25551 Pain in right hip: Secondary | ICD-10-CM

## 2021-04-24 DIAGNOSIS — M545 Low back pain, unspecified: Secondary | ICD-10-CM

## 2021-04-24 NOTE — Patient Instructions (Signed)
Access Code: [email or web address] URL: [email or web address] Date: 04/24/2021 Prepared by: Hilda Blades  Exercises Supine Lower Trunk Rotation - 1-2 x daily - 7 x weekly - 10 reps - 5 hold Supine Hip Internal and External Rotation - 1-2 x daily - 7 x weekly - 10 reps - 5 hold Hip Flexor Stretch at Edge of Bed - 1-2 x daily - 7 x weekly - 3 reps - 30 hold Hook Lying Single Knee to Chest Stretch with Towel - 1-2 x daily - 7 x weekly - 2 reps - 30 hold Supine Piriformis Stretch with Towel - 1-2 x daily - 7 x weekly - 2 reps - 30 hold Supine Knees to Chest with Swiss Ball - 1-2 x daily - 7 x weekly - 2 sets - 5 reps - 10 hold Supine Pelvic Tilt - 1-2 x daily - 7 x weekly - 2 sets - 5 reps - 3 hold Hooklying Clamshell with Resistance - 1-2 x daily - 7 x weekly - 2 sets - 10 reps - 3 hold Clamshell - 1-2 x daily - 7 x weekly - 2 sets - 5 reps Prone Hip Extension - Two Pillows - 1-2 x daily - 7 x weekly - 2 sets - 5 reps Cat-Camel - 1-2 x daily - 7 x weekly - 2 sets - 5 reps Standing Forward Trunk Flexion - 1-2 x daily - 7 x weekly - 5-10 reps

## 2021-04-24 NOTE — Therapy (Addendum)
[place], [place], [address] Phone: [phone]   Fax:  [phone]  Physical Therapy Treatment / Discharge  Patient Details  Name: Latoya Swanson MRN: [id number] Date of Birth: 1951-08-18 Referring Provider (PT): [name], MD   Encounter Date: 04/24/2021   PT End of Session - 04/24/21 1232     Visit Number 4    Number of Visits 16    Date for PT Re-Evaluation 05/27/21    Authorization Type [employer] MCR    Authorization Time Period 04/04/21 - 06/15/21    Authorization - Visit Number 3    Authorization - Number of Visits 16    Progress Note Due on Visit 10    PT Start Time 1226    PT Stop Time 1300    PT Time Calculation (min) 34 min    Activity Tolerance Patient tolerated treatment well    Behavior During Therapy WFL for tasks assessed/performed             Past Medical History:  Diagnosis Date   Alcoholism ([place])    Anxiety    Arthritis    Cirrhosis ([place])    Colon polyps    Depression    GERD (gastroesophageal reflux disease)    Hepatitis C    treated and cured   History of bleeding peptic ulcer    History of MRI of spine    Right foraminal stenosis at C2-3 due to spurring.   Hypertension    Normal cardiac stress test 2008    [name]   Normal echocardiogram 08/19/2007    Past Surgical History:  Procedure Laterality Date   ANAL FISSURE REPAIR     CARPAL TUNNEL RELEASE Bilateral    DILATION AND CURETTAGE OF UTERUS     ectopic pregnancy   ROTATOR CUFF REPAIR Right    x 2   TUBAL LIGATION      There were no vitals filed for this visit.   Subjective Assessment - 04/24/21 1228     Subjective Patient reports she is doing better, she has been walking more and feel she can walk a little longer, about 20-30 minutes.    Patient Stated Goals Improve walking ability and activity level to get back to gardening    Currently in Pain? Yes    Pain Score 4     Pain Location Back     Pain Orientation Right    Pain Descriptors / Indicators Tightness    Pain Type Chronic pain    Pain Radiating Towards right lower back and hip region    Pain Onset More than a month ago    Pain Frequency Constant                [facility] PT Assessment - 04/24/21 0001       PROM   Right Hip Flexion 110      Strength   Right Hip Extension 3-/5    Right Hip ABduction 3/5                           [facility] Adult PT Treatment/Exercise - 04/24/21 0001       Exercises   Exercises Lumbar      Lumbar Exercises: Stretches   Single Knee to Chest Stretch 2 reps;30 seconds    Single Knee to Chest Stretch Limitations supine PROM    Lower Trunk Rotation 5 reps;10 seconds    Hip  Flexor Stretch 3 reps;30 seconds    Hip Flexor Stretch Limitations modified thomas edge of mat with manual stretch    Piriformis Stretch 2 reps;30 seconds    Piriformis Stretch Limitations supine    Other Lumbar Stretch Exercise Seated lumbar flexion physioball roll out 5 x 10 sec    Other Lumbar Stretch Exercise Supine windshield wiper hip IR stretch 5 x 10 sec      Lumbar Exercises: Aerobic   Nustep L5 x 4 min with LE/UE while taking subjective      Lumbar Exercises: Supine   Pelvic Tilt 5 reps;5 seconds    Clam 10 reps;3 seconds   2 sets   Clam Limitations red band    Bridge 5 reps;3 seconds   2 sets   Bridge Limitations partial range      Manual Therapy   Manual Therapy Joint mobilization    Joint Mobilization Right LAD using belt with gentle oscillations x 5 bouts                    PT Education - 04/24/21 1230     Education Details HEP    Person(s) Educated Patient    Methods Explanation;Demonstration;Verbal cues    Comprehension Verbalized understanding;Returned demonstration;Verbal cues required;Need further instruction              PT Short Term Goals - 04/24/21 1306       PT SHORT TERM GOAL #1   Title Patient will be I with initial HEP to progress with  PT    Baseline progressing HEP    Time 4    Period Weeks    Status On-going    Target Date 04/29/21      PT SHORT TERM GOAL #2   Title PT will review FOTO results with patient for progress expectations    Baseline reviewed on 2nd visit    Time 3    Period Weeks    Status Achieved    Target Date 04/22/21      PT SHORT TERM GOAL #3   Title Patient will report </= 2/10 pain of right lower back and hip to improve functional mobility    Baseline 4/10 pain level    Time 4    Period Weeks    Status On-going    Target Date 04/29/21      PT SHORT TERM GOAL #4   Title Patient will be able to ambulate >/= 20 minutes prior to requiring rest break to improve community access    Baseline patient reports walking 20-30 minutes    Time 4    Period Weeks    Status Achieved    Target Date 04/29/21               PT Stetzel Term Goals - 04/01/21 1413       PT Nicoletti TERM GOAL #1   Title Patient will be I with final HEP to maintain progress from PT    Baseline provided initial HEP at eval    Time 8    Period Weeks    Status New    Target Date 05/27/21      PT Allor TERM GOAL #2   Title Patient will report >/= 56% functional status on FOTO    Baseline 45% functional status    Time 8    Period Weeks    Status New    Target Date 05/27/21      PT Nikolai TERM GOAL #3  Title Patient will demonstrate improved right hip strength grossly >/= 4-/5 MMT to improve walking ability    Baseline right hip strength grossly 3/5 MMT    Time 8    Period Weeks    Status New    Target Date 05/27/21      PT Mcartor TERM GOAL #4   Title Patient will demonstrate right hip PROM grossly WFL and non-painful to improve gardening ability    Baseline right hip flexion, extension, and ER PROM limitations at eval    Time 8    Period Weeks    Status New    Target Date 05/27/21      PT Tholl TERM GOAL #5   Title Patient will be able to walk >/= 45 minutes to improve ability to exercise    Baseline 15 minutes  at eval    Time 8    Period Weeks    Status New    Target Date 05/27/21                   Plan - 04/24/21 1307     Clinical Impression Statement Patient tolerated therapy well with no adverse effects. She reports improvement in low back/hip symptoms and improve walking tolerance. Continued use of LAD to reduce pain and progressed stretching for piriformis. She continues to exhibit hip weakness and seems to continue to respond well to flexion based lumbar movements. Patient would benefit from continued skilled PT to progress lumbar motion, right hip motion and strength, and reduce pain to improve walking ability and return to previous activities such as exercise and gardening.    PT Treatment/Interventions ADLs/Self Care Home Management;Aquatic Therapy;Cryotherapy;Electrical Stimulation;Iontophoresis 47m/ml Dexamethasone;Moist Heat;Traction;Ultrasound;Neuromuscular re-education;Balance training;Therapeutic exercise;Therapeutic activities;Functional mobility training;Stair training;Gait training;Patient/family education;Manual techniques;Dry needling;Passive range of motion;Taping;Spinal Manipulations;Joint Manipulations    PT Next Visit Plan Review HEP and progress PRN, manual/STM for right quad/lumbar paraspinals/gluteals/piriformis/hamstring regions, LAD for right LE, stretching for right hip and low back as tolerated (hip flexor/quad, piriformis, SKTC, hamstring), progress right hip strengthening as tolerated, initiate balance training as tolerated    PT Home Exercise Plan [email or web address]   Consulted and Agree with Plan of Care Patient             Patient will benefit from skilled therapeutic intervention in order to improve the following deficits and impairments:  Abnormal gait, Decreased range of motion, Difficulty walking, Decreased activity tolerance, Pain, Decreased balance, Impaired flexibility, Increased muscle spasms, Postural dysfunction, Decreased strength  Visit  Diagnosis: Chronic right-sided low back pain, unspecified whether sciatica present  Pain in right hip  Muscle weakness (generalized)  Other abnormalities of gait and mobility     Problem List Patient Active Problem List   Diagnosis Date Noted   Blood in stool 06/13/2020   Chronic pain syndrome 09/15/2017   Sciatica associated with disorder of lumbar spine 09/01/2016   Abnormal gall bladder diagnostic imaging 10/24/2015   Hepatic cirrhosis ([place] 07/18/2015   Tobacco use 03/03/2012   Breast pain 06/23/2011   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   Heart palpitations 05/29/2010   Cervical radiculitis 06/29/2009   OVERWEIGHT 03/08/2009   CARPAL TUNNEL SYNDROME, LEFT 07/13/2008   Depression 07/05/2007   Essential hypertension 07/05/2007   GERD 07/05/2007   HYPERCHOLESTEROLEMIA, MILD 03/05/2007    CHilda Blades PT, DPT, LAT, ATC 04/24/21  1:13 PM Phone: [phone]: 3[facility] [address] [address]Phone: [phone]  Fax:  [phone]  Name: BATUL DIEGO MRN: [id number] Date of Birth: [birth year]   PHYSICAL THERAPY DISCHARGE SUMMARY  Visits from Start of Care: 4  Current functional level related to goals / functional outcomes: See above   Remaining deficits: See above   Education / Equipment: HEP   Patient agrees to discharge. Patient goals were partially met. Patient is being discharged due to not returning since the last visit.

## 2021-05-01 ENCOUNTER — Other Ambulatory Visit: Payer: Self-pay

## 2021-05-01 ENCOUNTER — Other Ambulatory Visit (INDEPENDENT_AMBULATORY_CARE_PROVIDER_SITE_OTHER): Payer: Medicare HMO

## 2021-05-01 ENCOUNTER — Encounter: Payer: Self-pay | Admitting: Gastroenterology

## 2021-05-01 ENCOUNTER — Other Ambulatory Visit: Payer: Self-pay | Admitting: Family Medicine

## 2021-05-01 ENCOUNTER — Ambulatory Visit (INDEPENDENT_AMBULATORY_CARE_PROVIDER_SITE_OTHER): Payer: Medicare HMO | Admitting: Gastroenterology

## 2021-05-01 VITALS — BP 138/90 | HR 72 | Ht 63.0 in | Wt 200.2 lb

## 2021-05-01 DIAGNOSIS — Z8619 Personal history of other infectious and parasitic diseases: Secondary | ICD-10-CM

## 2021-05-01 DIAGNOSIS — K7030 Alcoholic cirrhosis of liver without ascites: Secondary | ICD-10-CM

## 2021-05-01 DIAGNOSIS — Z860100 Personal history of colon polyps, unspecified: Secondary | ICD-10-CM

## 2021-05-01 LAB — CBC WITH DIFFERENTIAL/PLATELET
Basophils Absolute: 0.1 10*3/uL (ref 0.0–0.1)
Basophils Relative: 1 % (ref 0.0–3.0)
Eosinophils Absolute: 0.2 10*3/uL (ref 0.0–0.7)
Eosinophils Relative: 2.6 % (ref 0.0–5.0)
HCT: 40.1 % (ref 36.0–46.0)
Hemoglobin: 13.9 g/dL (ref 12.0–15.0)
Lymphocytes Relative: 37.1 % (ref 12.0–46.0)
Lymphs Abs: 2.6 10*3/uL (ref 0.7–4.0)
MCHC: 34.6 g/dL (ref 30.0–36.0)
MCV: 89.7 fl (ref 78.0–100.0)
Monocytes Absolute: 0.6 10*3/uL (ref 0.1–1.0)
Monocytes Relative: 8.5 % (ref 3.0–12.0)
Neutro Abs: 3.6 10*3/uL (ref 1.4–7.7)
Neutrophils Relative %: 50.8 % (ref 43.0–77.0)
Platelets: 180 10*3/uL (ref 150.0–400.0)
RBC: 4.47 Mil/uL (ref 3.87–5.11)
RDW: 13 % (ref 11.5–15.5)
WBC: 7.1 10*3/uL (ref 4.0–10.5)

## 2021-05-01 LAB — COMPREHENSIVE METABOLIC PANEL
ALT: 23 U/L (ref 0–35)
AST: 23 U/L (ref 0–37)
Albumin: 4.7 g/dL (ref 3.5–5.2)
Alkaline Phosphatase: 92 U/L (ref 39–117)
BUN: 15 mg/dL (ref 6–23)
CO2: 25 mEq/L (ref 19–32)
Calcium: 9.6 mg/dL (ref 8.4–10.5)
Chloride: 106 mEq/L (ref 96–112)
Creatinine, Ser: 0.66 mg/dL (ref 0.40–1.20)
GFR: 89.57 mL/min (ref >60.00)
Glucose, Bld: 84 mg/dL (ref 70–99)
Potassium: 3.6 mEq/L (ref 3.5–5.1)
Sodium: 141 mEq/L (ref 135–145)
Total Bilirubin: 0.6 mg/dL (ref 0.2–1.2)
Total Protein: 7.9 g/dL (ref 6.0–8.3)

## 2021-05-01 LAB — PROTIME-INR
INR: 1 ratio (ref 0.8–1.0)
Prothrombin Time: 11 s (ref 9.6–13.1)

## 2021-05-01 MED ORDER — PLENVU 140 G PO SOLR: 140.0000 g | ORAL | 0 refills | Status: DC

## 2021-05-01 NOTE — Progress Notes (Signed)
[facility] Consult Note:  History: Latoya Swanson 05/01/2021  Referring provider: [name], MD  Reason for consult/chief complaint: Melena (For Swanson couple of days 3 months ago, none since, no complaints today, to discuss having colonoscopy), Cirrhosis (no complaints today), and Family  Hx Of Colon Cancer (Mother)   Subjective  HPI: This patient was referred to [place] by primary care for reported blood in stool. I saw her in April 2017 for office consult after referral from infectious disease for cirrhosis with prior alcohol abuse and hepatitis C infection that was under treatment.  An excerpt from my note is as follows: "This is the initial office visit for Swanson 70 year old woman referred by infectious disease for evaluation of cirrhosis. Review of Latoya Swanson note from several months ago indicates that the patient has Swanson genotype 1A hepatitis C virus infection, most likely from her reported previous illicit drug use. She was treated with Latoya Swanson, and had Swanson good four-week response. Did complete the treatment course, but it looks like she did not get her posttreatment PCR. She plans to go by the clinic today and have it drawn. She previously was Swanson heavy alcohol user until about 10 years ago, now says it is "occasional". She has never had an upper endoscopy or upper GI bleeding. Some available archive records from [facility] indicate she had Swanson tubular adenoma on Swanson colonoscopy in early 2009. She denies change in bowel habits rectal bleeding dysphagia, odynophagia, nausea vomiting or weight loss."  Patient was scheduled for EGD to screen for esophageal varices as well as Swanson surveillance colonoscopy.  She was also advised of the need for every 41-month follow-up for cirrhosis surveillance and hepatoma screening. Latoya Swanson tells me today that she was not able to follow through with those things because she had to move back to the [place] area to care for Swanson sick family member.  She has had  other family struggles and losses in the years since then including loss of her grandson to gun violence just after he graduated from college, and her sister had Swanson massive stroke earlier this year.  Despite that, she reports that her health has generally been good although she has gained more weight than she would like in recent years.  She attributes it to 2 many carbohydrates and the seclusion and stresses of COVID. Swanson few months back she had Swanson black stool and none since then.  She denies abdominal pain or altered bowel habits.  No history of hematemesis, no dysphagia nausea or vomiting  Note from most recent primary care visit 04/17/2021 reviewed.  Most recent lab tests on file from 2021 noted below  Patient says her mother had colon cancer.Dx lin her late 57s  ROS:  Review of Systems  Constitutional:  Negative for appetite change and unexpected weight change.  HENT:  Negative for mouth sores and voice change.   Eyes:  Negative for pain and redness.  Respiratory:  Negative for cough and shortness of breath.   Cardiovascular:  Negative for chest pain and palpitations.  Genitourinary:  Negative for dysuria and hematuria.  Musculoskeletal:  Positive for arthralgias. Negative for myalgias.  Skin:  Negative for pallor and rash.  Neurological:  Negative for weakness and headaches.  Hematological:  Negative for adenopathy.  Psychiatric/Behavioral:         Depression and anxiety, see above for family/social stresses    Past Medical History: Past Medical History:  Diagnosis Date   Alcoholism ([place])  Anxiety    Arthritis    Cirrhosis ([facility])    Colon polyps    Colon polyps    Depression    GERD (gastroesophageal reflux disease)    Hepatitis C    treated and cured   History of bleeding peptic ulcer    History of MRI of spine    Right foraminal stenosis at C2-3 due to spurring.   History of rectal fissure    Hypertension    Normal cardiac stress test 11/10/2006   Latoya Swanson   Normal  echocardiogram 08/19/2007     Past Surgical History: Past Surgical History:  Procedure Laterality Date   ANAL FISSURE REPAIR     CARPAL TUNNEL RELEASE Bilateral    DILATION AND CURETTAGE OF UTERUS     ectopic pregnancy   ROTATOR CUFF REPAIR Right    x 2   TUBAL LIGATION       Family History: Family History  Problem Relation Age of Onset   Breast cancer Mother        died age 5   Colon cancer Mother    Arthritis Mother    Alzheimer's disease Mother    Coronary artery disease Father        died age 51   Stomach cancer Father    Arthritis Father    Stroke Sister    Heart failure Sister    Hypertension Sister    Arthritis Maternal Grandmother    Arthritis Paternal Grandmother    Breast cancer Paternal Grandmother    Alzheimer's disease Maternal Aunt        x5   Breast cancer Other        PGA    Social History: Social History   Socioeconomic History   Marital status: Single    Spouse name: Latoya Swanson    Number of children: 2   Years of education: 15   Highest education level: Not on file  Occupational History   Occupation: DISABLED    Employer: UNEMPLOYED  Tobacco Use   Smoking status: Some Days    Pack years: 0.00    Types: Cigarettes    Last attempt to quit: 02/20/2013    Years since quitting: 8.1   Smokeless tobacco: Never   Tobacco comments:    2-3 cigs Swanson day  Vaping Use   Vaping Use: Never used  Substance and Sexual Activity   Alcohol use: Not Currently   Drug use: No   Sexual activity: Yes    Birth control/protection: Post-menopausal  Other Topics Concern   Not on file  Social History Narrative   Lives with her husband and her Chow dog.    Social Determinants of Health   Financial Resource Strain: Not on file  Food Insecurity: Not on file  Transportation Needs: Not on file  Physical Activity: Not on file  Stress: Not on file  Social Connections: Not on file    Allergies: Allergies  Allergen Reactions   Aspirin     Causes  stomach bleeds hx of ulcers    Outpatient Meds: Current Outpatient Medications  Medication Sig Dispense Refill   amLODipine (NORVASC) 10 MG tablet TAKE 1 TABLET BY MOUTH EVERY DAY 90 tablet 0   baclofen (LIORESAL) 10 MG tablet TAKE 1 TABLET BY MOUTH TWICE Swanson DAY AS NEEDED FOR MUSCLE SPASMS 180 tablet 1   Cholecalciferol (VITAMIN D-3) 1000 UNITS CAPS Take 1 capsule (1,000 Units total) by mouth daily. 30 capsule 6   diclofenac (VOLTAREN) 75 MG EC  tablet TAKE 1 TABLET (75 MG TOTAL) BY MOUTH 2 (TWO) TIMES DAILY AS NEEDED. 60 tablet 0   gabapentin (NEURONTIN) 300 MG capsule TAKE 3 CAPSULES BY MOUTH 3 TIMES DAILY. (Patient taking differently: 900 mg 2 (two) times daily as needed. TAKE 3 CAPSULES BY MOUTH 3 TIMES DAILY.) 270 capsule 0   hydrOXYzine (ATARAX/VISTARIL) 25 MG tablet TAKE ONE UP TO THREE TIMES Swanson DAY AS NEEDED ANXIETY 60 tablet 1   oxycodone (OXY-IR) 5 MG capsule Take 1 capsule (5 mg total) by mouth every 4 (four) hours as needed. 30 capsule 0   sertraline (ZOLOFT) 25 MG tablet TAKE 1 TABLET (25 MG TOTAL) BY MOUTH DAILY. 90 tablet 1   traMADol (ULTRAM) 50 MG tablet TAKE 1 TABLET (50 MG TOTAL) BY MOUTH EVERY 12 (TWELVE) HOURS AS NEEDED. FOR PAIN 30 tablet 0   triamterene-hydrochlorothiazide (MAXZIDE-25) 37.5-25 MG tablet TAKE 1 TABLET BY MOUTH EVERY DAY 90 tablet 1   vitamin B-12 (CYANOCOBALAMIN) 500 MCG tablet Take 500 mcg by mouth daily.     No current facility-administered medications for this visit.      ___________________________________________________________________ Objective   Exam:  BP 138/90 (BP Location: Left Arm, Patient Position: Sitting, Cuff Size: Normal)   Pulse 72   Ht 5\' 3"  (1.6 m) Comment: height measured without shoes  Wt 200 lb 4 oz (90.8 kg)   LMP 12/01/2001   BMI 35.47 kg/m  Wt Readings from Last 3 Encounters:  05/01/21 200 lb 4 oz (90.8 kg)  04/17/21 200 lb 12.8 oz (91.1 kg)  03/05/21 205 lb 3.2 oz (93.1 kg)    General: Well-appearing Eyes: sclera  anicteric, no redness ENT: oral mucosa moist without lesions, no cervical or supraclavicular lymphadenopathy CV: RRR without murmur, S1/S2, no JVD, trace bilateral pretibial edema Resp: clear to auscultation bilaterally, normal RR and effort noted GI: soft, obese, no tenderness, with active bowel sounds.  No appreciable hepatosplenomegaly, though limited by body habitus.  No caput medusae Skin; warm and dry, no rash or jaundice noted, no spider nevi Neuro: awake, alert and oriented x 3. Normal gross motor function and fluent speech No asterixis.  Steady gait, normal affect Labs:  CBC Latest Ref Rng & Units 06/13/2020 02/29/2020 08/17/2019  WBC 3.4 - 10.8 x10E3/uL 7.8 5.8 6.6  Hemoglobin 11.1 - 15.9 g/dL 12.5 14.2 13.7  Hematocrit 34.0 - 46.6 % 36.8 41.4 40.3  Platelets 150 - 450 x10E3/uL 189 181 194   CMP Latest Ref Rng & Units 02/29/2020 08/17/2019 09/28/2018  Glucose 65 - 99 mg/dL 94 91 97  BUN 8 - 27 mg/dL 14 13 16   Creatinine 0.57 - 1.00 mg/dL 0.75 0.61 0.67  Sodium 134 - 144 mmol/L 139 141 142  Potassium 3.5 - 5.2 mmol/L 3.6 3.5 3.7  Chloride 96 - 106 mmol/L 103 107(H) 101  CO2 20 - 29 mmol/L 21 20 22   Calcium 8.7 - 10.3 mg/dL 9.8 9.6 11.1(H)  Total Protein 6.0 - 8.5 g/dL 7.5 7.3 7.8  Total Bilirubin 0.0 - 1.2 mg/dL 0.5 0.3 0.4  Alkaline Phos 39 - 117 IU/L 103 103 101  AST 0 - 40 IU/L 23 25 24   ALT 0 - 32 IU/L 18 23 24    Last INR on file 1.1 in August 2016  Radiologic Studies:  Last abdominal ultrasound December 2016  HCV PCR negative October 2016 and April 2017  2016 HBV Sab pos,neg HBV Sag,  total HAV Ab pos  Assessment: Encounter Diagnoses  Name Primary?   Alcoholic  cirrhosis of liver without ascites ([place]) Yes   History of hepatitis C    Personal history of colonic polyps     Cirrhosis from Swanson combination of prior alcohol use and hepatitis C infection.  HCV cleared with antiviral therapy under the direction of infectious disease clinic in 2016/17.   Compensated  cirrhosis, no clear evidence of ascites, no history of GI bleeding, no encephalopathy.  [place] overdue for hepatoma screening. We discussed cirrhosis and the need for both variceal screening and at least every 52-month follow-up for surveillance of the cirrhosis and liver cancer screening.  She is back living in this area and had her sister moved here to rehab after the stroke, so she plans to be here permanently and is committed to her health maintenance.  History of colon polyp in 2009, [place] overdue for surveillance colonoscopy.  Family history of colon cancer in mother at advanced age.  Plan:  Labs today: CBC, CMP, PT/INR, AFP  Abdominal ultrasound to be scheduled.  Indication to screen for hepatoma and evaluate for ascites in patient with cirrhosis  Upper endoscopy to screen for esophageal varices.  She had described some black stool couple of months ago, not clear if that was blood.  Certainly will be evaluated at that time. Surveillance colonoscopy.  She was agreeable after discussion of procedure and risks.  The benefits and risks of the planned procedure were described in detail with the patient or (when appropriate) their health care proxy.  Risks were outlined as including, but not limited to, bleeding, infection, perforation, adverse medication reaction leading to cardiac or pulmonary decompensation, pancreatitis (if ERCP).  The limitation of incomplete mucosal visualization was also discussed.  No guarantees or warranties were given.   Thank you for the courtesy of this consult.  Please call me with any questions or concerns.  Nelida Meuse III  CC: Referring provider noted above

## 2021-05-01 NOTE — Patient Instructions (Signed)
If you are age 69 or older, your body mass index should be between 23-30. Your Body mass index is 35.47 kg/m. If this is out of the aforementioned range listed, please consider follow up with your Primary Care Provider.  If you are age 41 or younger, your body mass index should be between 19-25. Your Body mass index is 35.47 kg/m. If this is out of the aformentioned range listed, please consider follow up with your Primary Care Provider.   __________________________________________________________  The [facility] providers would like to encourage you to use [facility] to communicate with providers for non-urgent requests or questions.  Due to [place] hold times on the telephone, sending your provider a message by [facility] may be a faster and more efficient way to get a response.  Please allow 48 business hours for a response.  Please remember that this is for non-urgent requests.   Your provider has requested that you go to the basement level for lab work before leaving today. Press "B" on the elevator. The lab is located at the first door on the left as you exit the elevator.   You have been scheduled for an abdominal ultrasound at [facility] (1st floor of hospital) on 05-08-2021 at 930am. Please arrive 15 minutes prior to your appointment for registration. Make certain not to have anything to eat or drink 6 hours prior to your appointment. Should you need to reschedule your appointment, please contact radiology at [phone]. This test typically takes about 30 minutes to perform.  Due to recent changes in healthcare laws, you may see the results of your imaging and laboratory studies on MyChart before your provider has had a chance to review them.  We understand that in some cases there may be results that are confusing or concerning to you. Not all laboratory results come back in the same time frame and the provider may be waiting for multiple results in order to interpret others.  Please give  [place] 48 hours in order for your provider to thoroughly review all the results before contacting the office for clarification of your results.     It was a pleasure to see you today!  Thank you for trusting me with your gastrointestinal care!

## 2021-05-02 LAB — AFP TUMOR MARKER: AFP-Tumor Marker: 5.6 ng/mL

## 2021-05-08 ENCOUNTER — Ambulatory Visit (HOSPITAL_COMMUNITY): Admission: RE | Admit: 2021-05-08 | Payer: Medicare HMO | Source: Ambulatory Visit

## 2021-05-15 ENCOUNTER — Ambulatory Visit (HOSPITAL_COMMUNITY)
Admission: RE | Admit: 2021-05-15 | Discharge: 2021-05-15 | Disposition: A | Discharge status: Home / Self Care | Payer: Medicare HMO | Source: Ambulatory Visit | Attending: Gastroenterology | Admitting: Gastroenterology

## 2021-05-15 ENCOUNTER — Other Ambulatory Visit: Payer: Self-pay

## 2021-05-15 DIAGNOSIS — K7460 Unspecified cirrhosis of liver: Secondary | ICD-10-CM | POA: Diagnosis not present

## 2021-05-15 DIAGNOSIS — Z8619 Personal history of other infectious and parasitic diseases: Secondary | ICD-10-CM | POA: Diagnosis not present

## 2021-05-15 DIAGNOSIS — K7030 Alcoholic cirrhosis of liver without ascites: Secondary | ICD-10-CM | POA: Diagnosis not present

## 2021-05-15 DIAGNOSIS — Z86010 Personal history of colonic polyps: Secondary | ICD-10-CM | POA: Diagnosis not present

## 2021-05-18 ENCOUNTER — Other Ambulatory Visit: Payer: Self-pay | Admitting: Family Medicine

## 2021-05-22 ENCOUNTER — Encounter: Payer: Self-pay | Admitting: Family Medicine

## 2021-05-22 ENCOUNTER — Other Ambulatory Visit: Payer: Self-pay

## 2021-05-22 ENCOUNTER — Ambulatory Visit (INDEPENDENT_AMBULATORY_CARE_PROVIDER_SITE_OTHER): Payer: Medicare HMO | Admitting: Family Medicine

## 2021-05-22 VITALS — BP 141/80 | HR 75 | Wt 201.6 lb

## 2021-05-22 DIAGNOSIS — Z720 Tobacco use: Secondary | ICD-10-CM | POA: Diagnosis not present

## 2021-05-22 DIAGNOSIS — N644 Mastodynia: Secondary | ICD-10-CM

## 2021-05-22 DIAGNOSIS — R0789 Other chest pain: Secondary | ICD-10-CM | POA: Diagnosis not present

## 2021-05-22 DIAGNOSIS — K921 Melena: Secondary | ICD-10-CM | POA: Diagnosis not present

## 2021-05-22 DIAGNOSIS — E2839 Other primary ovarian failure: Secondary | ICD-10-CM

## 2021-05-22 DIAGNOSIS — F321 Major depressive disorder, single episode, moderate: Secondary | ICD-10-CM | POA: Diagnosis not present

## 2021-05-22 MED ORDER — NICOTINE 7 MG/24HR TD PT24: 7.0000 mg | MEDICATED_PATCH | Freq: Every day | TRANSDERMAL | 0 refills | Status: DC

## 2021-05-22 NOTE — Progress Notes (Signed)
    SUBJECTIVE:   CHIEF COMPLAINT / HPI:   Breast Pain Continues to have bilateral diffuse breast tenderness that is worse with movement or palpation.  Has two small skin areas, one each side, that are irritated without discharge.  No nipple discharge.  No fever  Chest Wall Pain Above her breasts on her sternum she is intermittently tender to palpation.  Sometimes at rest, sometimes with movement.  Does not have chest pain with exertion or any shortness of breath.  No trauma  Muscle Cramps  Having intermittent cramps in her hands and R leg.  Feels is drinking well.  No change in medications   Tobacco Smokes 3-7 cigs per day when stressed.  Would like to try the patch although understands is mostly a habit for her  PERTINENT  PMH / PSH: Recent visit to GI has colonoscopy planned  OBJECTIVE:   BP (!) 141/80   Pulse 75   Wt 201 lb 9.6 oz (91.4 kg)   LMP 12/01/2001   SpO2 99%   BMI 35.71 kg/m   Breasts - normal external appearance except two areas of irritation symetrically about 4 and 8 oclock on R and L  breasts Diffusely tender to palpation without masses or discharge.  Axillary area is mildly tender without masses Sternum - tender to palpation upper sternal area bilaterally.  No deformity or bruising Heart - Regular rate and rhythm.  No murmurs, gallops or rubs.    Lungs:  Normal respiratory effort, chest expands symmetrically. Lungs are clear to auscultation, no crackles or wheezes. Legs - FROM without calf tenderness or edema  Ears:  External ear exam shows no significant lesions or deformities.  Otoscopic examination reveals clear canals, tympanic membranes are intact bilaterally without bulging, retraction, inflammation or discharge. Hearing is grossly normal bilaterall   ASSESSMENT/PLAN:   Breast pain Unsure of cause.  Has normal exam on physical except for mild skin irritation from friction intertrigo.  Will obtain mammogram   Depression Reports in well controlled on  low dose SSRI and as needed atarax   Tobacco use She wishes to try a low dose patch.    Blood in stool GI work up is in progress  Chest wall pain Intermittent pain over upper sternum worsened with palpation does not change with exertion.  Consistent with constrochondritis no signs of CAD.  Will use topical NSAIDs and monitor      [name], MD [place]

## 2021-05-22 NOTE — Patient Instructions (Addendum)
Good to see you today - Thank you for coming in  Things we discussed today:  I ordered a diagnostic mammogram and a bone density test  Cramps - keep hydrated, stretch before might get a cramp.   Take one 10 meq Potassium tablet a day.  Over the counter  Chest Wall pain - use heat or ice and the volatren gel.  If it becomes severe but does not hurt when you touch then go to the ER  Smoking  Use one patch a day if you are craving a cigarette  Sores under breast Air out whenever you can Use vaseline on the sore area twice a day   Please always bring your medication bottles  Come back to see me in 1 month

## 2021-05-22 NOTE — Assessment & Plan Note (Signed)
Unsure of cause.  Has normal exam on physical except for mild skin irritation from friction intertrigo.  Will obtain mammogram

## 2021-05-23 NOTE — Assessment & Plan Note (Signed)
Intermittent pain over upper sternum worsened with palpation does not change with exertion.  Consistent with constrochondritis no signs of CAD.  Will use topical NSAIDs and monitor

## 2021-05-23 NOTE — Assessment & Plan Note (Signed)
GI work up is in progress

## 2021-05-23 NOTE — Assessment & Plan Note (Signed)
Reports in well controlled on low dose SSRI and as needed atarax

## 2021-05-23 NOTE — Assessment & Plan Note (Signed)
She wishes to try a low dose patch.

## 2021-05-24 ENCOUNTER — Other Ambulatory Visit: Payer: Self-pay | Admitting: Family Medicine

## 2021-05-24 DIAGNOSIS — N644 Mastodynia: Secondary | ICD-10-CM

## 2021-05-30 ENCOUNTER — Other Ambulatory Visit: Payer: Self-pay | Admitting: Family Medicine

## 2021-06-09 ENCOUNTER — Other Ambulatory Visit: Payer: Self-pay | Admitting: Family Medicine

## 2021-06-16 NOTE — Addendum Note (Signed)
Encounter addended by: Annie Paras on: 06/16/2021 2:32 PM  Actions taken: Letter saved

## 2021-06-19 ENCOUNTER — Encounter: Payer: Medicare HMO | Admitting: Gastroenterology

## 2021-06-25 ENCOUNTER — Other Ambulatory Visit: Payer: Self-pay | Admitting: Family Medicine

## 2021-06-25 NOTE — Progress Notes (Signed)
    SUBJECTIVE:   CHIEF COMPLAINT / HPI:   Chest Wall Pain Improved. Using Voltaren gel which helps.  No masses or pain with exertion or shortness of breath  Grief Her sister died in her arms in hospice about a month ago.  Keeps going by staying active.  No suicidal ideation.  Thinks zoloft is helping  Tobacco Continues to smoke same amount.  Did mention [place] Quit and would like to stop for her deceased sister Ramona and for her twin grandkids    PERTINENT  PMH / PSH: letter from [facility] mentions incidental finding on recent US.  We reviewed together   OBJECTIVE:   BP (!) 147/82   Pulse 93   Wt 201 lb 9.6 oz (91.4 kg)   LMP 12/01/2001   SpO2 98%   BMI 35.71 kg/m   Psych:  Cognition and judgment appear intact. Alert, communicative  and cooperative with normal attention span and concentration. No apparent delusions, illusions, hallucinations Moving well with cane   ASSESSMENT/PLAN:   Essential hypertension Not at goal.  Try to decrease oral diclofenac and monitor   Tobacco use She is actively interested in stopping See after visit summary.    Depression Stable.  Seems to be dealing well with her sister's recent death in 06/19/2021.  Continue current medications   Chest wall pain Improved.  Continue voltaren   Breast pain Unchanged.   Mammogram is scheduled.  No signs of mastitis or cancer      [name], MD [place]

## 2021-06-25 NOTE — Patient Instructions (Signed)
Good to see you today - Thank you for coming in  Things we discussed today:  Cut back on the diclofenac only as needed as makes your blood pressure go up   You need to stop smoking!  Call [place] (1-800-QUIT-NOW) for ideas  Choose a quit date when you will stop completely.  Get prepared by slowly cutting down.  Find a substitute to use when you think you need a cigarette.  If you wish to discuss nicotine replacement (patches, gum) please make an appointment   I sent a prescription to your pharmacy for your Zoster vaccines to help prevent Shingles.  The shot may cause a sore arm and mild flu like symptoms for a few days.  You will need a second shot 2 months after your first.    Please always bring your medication bottles  Come back to see me in 3 months

## 2021-06-26 ENCOUNTER — Encounter: Payer: Self-pay | Admitting: Family Medicine

## 2021-06-26 ENCOUNTER — Other Ambulatory Visit: Payer: Self-pay

## 2021-06-26 ENCOUNTER — Ambulatory Visit (INDEPENDENT_AMBULATORY_CARE_PROVIDER_SITE_OTHER): Payer: Medicare HMO | Admitting: Family Medicine

## 2021-06-26 DIAGNOSIS — R0789 Other chest pain: Secondary | ICD-10-CM | POA: Diagnosis not present

## 2021-06-26 DIAGNOSIS — Z720 Tobacco use: Secondary | ICD-10-CM | POA: Diagnosis not present

## 2021-06-26 DIAGNOSIS — N644 Mastodynia: Secondary | ICD-10-CM | POA: Diagnosis not present

## 2021-06-26 DIAGNOSIS — I10 Essential (primary) hypertension: Secondary | ICD-10-CM

## 2021-06-26 DIAGNOSIS — F321 Major depressive disorder, single episode, moderate: Secondary | ICD-10-CM

## 2021-06-26 MED ORDER — ZOSTER VAC RECOMB ADJUVANTED 50 MCG/0.5ML IM SUSR: INTRAMUSCULAR | 1 refills | Status: DC

## 2021-06-26 NOTE — Assessment & Plan Note (Signed)
She is actively interested in stopping See after visit summary.

## 2021-06-26 NOTE — Assessment & Plan Note (Signed)
Not at goal.  Try to decrease oral diclofenac and monitor

## 2021-06-26 NOTE — Assessment & Plan Note (Signed)
Improved.  Continue voltaren

## 2021-06-26 NOTE — Assessment & Plan Note (Signed)
Stable.  Seems to be dealing well with her sister's recent death in 06/01/21.  Continue current medications

## 2021-06-26 NOTE — Assessment & Plan Note (Signed)
Unchanged.   Mammogram is scheduled.  No signs of mastitis or cancer

## 2021-07-01 ENCOUNTER — Other Ambulatory Visit: Payer: Medicare HMO

## 2021-07-08 ENCOUNTER — Other Ambulatory Visit: Payer: Self-pay | Admitting: Family Medicine

## 2021-07-29 ENCOUNTER — Ambulatory Visit: Payer: Medicare HMO

## 2021-07-29 ENCOUNTER — Ambulatory Visit
Admission: RE | Admit: 2021-07-29 | Discharge: 2021-07-29 | Disposition: A | Discharge status: Home / Self Care | Payer: Medicare HMO | Source: Ambulatory Visit | Attending: Family Medicine | Admitting: Family Medicine

## 2021-07-29 ENCOUNTER — Other Ambulatory Visit: Payer: Self-pay

## 2021-07-29 DIAGNOSIS — R922 Inconclusive mammogram: Secondary | ICD-10-CM | POA: Diagnosis not present

## 2021-07-29 DIAGNOSIS — N644 Mastodynia: Secondary | ICD-10-CM

## 2021-07-31 ENCOUNTER — Encounter: Payer: Medicare HMO | Admitting: Gastroenterology

## 2021-07-31 ENCOUNTER — Other Ambulatory Visit: Payer: Medicare HMO

## 2021-08-05 ENCOUNTER — Other Ambulatory Visit: Payer: Self-pay | Admitting: Family Medicine

## 2021-08-08 ENCOUNTER — Other Ambulatory Visit: Payer: Self-pay | Admitting: Family Medicine

## 2021-08-12 ENCOUNTER — Telehealth: Payer: Self-pay | Admitting: Gastroenterology

## 2021-08-12 ENCOUNTER — Other Ambulatory Visit: Payer: Self-pay

## 2021-08-12 MED ORDER — PLENVU 140 G PO SOLR: 140.0000 g | ORAL | 0 refills | Status: DC

## 2021-08-12 NOTE — Telephone Encounter (Signed)
Inbound call from patient states her pharmacy does not have her prescription for plenvu. Would like it sent to [facility].   Also would like a call to let her know it has been sent,

## 2021-08-12 NOTE — Telephone Encounter (Signed)
Rx has been sent as patient requested. Original Rx was sent in 04-2021

## 2021-08-13 ENCOUNTER — Encounter: Payer: Medicare HMO | Admitting: Gastroenterology

## 2021-08-13 ENCOUNTER — Telehealth: Payer: Self-pay | Admitting: Gastroenterology

## 2021-08-13 MED ORDER — PLENVU 140 G PO SOLR: 1.0000 | Freq: Once | ORAL | 0 refills | Status: AC

## 2021-08-13 MED ORDER — PLENVU 140 G PO SOLR: 1.0000 | Freq: Once | ORAL | 0 refills | Status: DC

## 2021-08-13 NOTE — Telephone Encounter (Signed)
Patient called said she has been trying to obtain her Plenvu prep medication and has been having a hard time is requesting for a nurse to call her to help needed to reschedule her procedure due to not having her prep medication.

## 2021-08-13 NOTE — Telephone Encounter (Signed)
Returned pts call.  She has been having problems with getting her prescription from [facility]. Told her that I would leave a plenvu sample on the 3rd floor for her today.  Also will leave new instructions for date and time change.

## 2021-08-15 ENCOUNTER — Encounter: Payer: Self-pay | Admitting: Gastroenterology

## 2021-08-15 ENCOUNTER — Other Ambulatory Visit: Payer: Self-pay

## 2021-08-15 ENCOUNTER — Ambulatory Visit (AMBULATORY_SURGERY_CENTER): Payer: Medicare HMO | Admitting: Gastroenterology

## 2021-08-15 VITALS — BP 114/70 | HR 59 | Temp 98.0°F | Resp 15 | Ht 63.0 in | Wt 200.0 lb

## 2021-08-15 DIAGNOSIS — K2970 Gastritis, unspecified, without bleeding: Secondary | ICD-10-CM

## 2021-08-15 DIAGNOSIS — K2950 Unspecified chronic gastritis without bleeding: Secondary | ICD-10-CM | POA: Diagnosis not present

## 2021-08-15 DIAGNOSIS — D122 Benign neoplasm of ascending colon: Secondary | ICD-10-CM | POA: Diagnosis not present

## 2021-08-15 DIAGNOSIS — K7030 Alcoholic cirrhosis of liver without ascites: Secondary | ICD-10-CM | POA: Diagnosis not present

## 2021-08-15 DIAGNOSIS — Z86010 Personal history of colonic polyps: Secondary | ICD-10-CM | POA: Diagnosis not present

## 2021-08-15 DIAGNOSIS — B9681 Helicobacter pylori [H. pylori] as the cause of diseases classified elsewhere: Secondary | ICD-10-CM | POA: Diagnosis not present

## 2021-08-15 DIAGNOSIS — K7460 Unspecified cirrhosis of liver: Secondary | ICD-10-CM | POA: Diagnosis not present

## 2021-08-15 HISTORY — PX: COLONOSCOPY WITH ESOPHAGOGASTRODUODENOSCOPY (EGD): SHX5779

## 2021-08-15 MED ORDER — SODIUM CHLORIDE 0.9 % IV SOLN: 500.0000 mL | Freq: Once | INTRAVENOUS | Status: DC

## 2021-08-15 NOTE — Patient Instructions (Signed)
Handout given:  polyps Resume previous diet Continue current medications Await pathology results  YOU HAD AN ENDOSCOPIC PROCEDURE TODAY AT [facility]:   Refer to the procedure report that was given to you for any specific questions about what was found during the examination.  If the procedure report does not answer your questions, please call your gastroenterologist to clarify.  If you requested that your care partner not be given the details of your procedure findings, then the procedure report has been included in a sealed envelope for you to review at your convenience later.  YOU SHOULD EXPECT: Some feelings of bloating in the abdomen. Passage of more gas than usual.  Walking can help get rid of the air that was put into your GI tract during the procedure and reduce the bloating. If you had a lower endoscopy (such as a colonoscopy or flexible sigmoidoscopy) you may notice spotting of blood in your stool or on the toilet paper. If you underwent a bowel prep for your procedure, you may not have a normal bowel movement for a few days.  Please Note:  You might notice some irritation and congestion in your nose or some drainage.  This is from the oxygen used during your procedure.  There is no need for concern and it should clear up in a day or so.  SYMPTOMS TO REPORT IMMEDIATELY:  Following lower endoscopy (colonoscopy or flexible sigmoidoscopy):  Excessive amounts of blood in the stool  Significant tenderness or worsening of abdominal pains  Swelling of the abdomen that is new, acute  Fever of 100F or higher  For urgent or emergent issues, a gastroenterologist can be reached at any hour by calling [phone]. Do not use MyChart messaging for urgent concerns.   DIET:  We do recommend a small meal at first, but then you may proceed to your regular diet.  Drink plenty of fluids but you should avoid alcoholic beverages for 24 hours.  ACTIVITY:  You should plan to take it  easy for the rest of today and you should NOT DRIVE or use heavy machinery until tomorrow (because of the sedation medicines used during the test).    FOLLOW UP: Our staff will call the number listed on your records 48-72 hours following your procedure to check on you and address any questions or concerns that you may have regarding the information given to you following your procedure. If we do not reach you, we will leave a message.  We will attempt to reach you two times.  During this call, we will ask if you have developed any symptoms of COVID 19. If you develop any symptoms (ie: fever, flu-like symptoms, shortness of breath, cough etc.) before then, please call [phone].  If you test positive for Covid 19 in the 2 weeks post procedure, please call and report this information to [place].    If any biopsies were taken you will be contacted by phone or by letter within the next 1-3 weeks.  Please call us at [phone] if you have not heard about the biopsies in 3 weeks.   SIGNATURES/CONFIDENTIALITY: You and/or your care partner have signed paperwork which will be entered into your electronic medical record.  These signatures attest to the fact that that the information above on your After Visit Summary has been reviewed and is understood.  Full responsibility of the confidentiality of this discharge information lies with you and/or your care-partner.

## 2021-08-15 NOTE — Progress Notes (Signed)
Sedate, gd SR, tolerated procedure well, VSS, report to RN 

## 2021-08-15 NOTE — Progress Notes (Signed)
History and Physical:  This patient presents for endoscopic testing for: Encounter Diagnoses  Name Primary?   Alcoholic cirrhosis of liver without ascites ([place]) Yes   Personal history of colonic polyps     Patient denies chest pain or dyspnea. 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   History of rectal fissure    Hypertension    Normal cardiac stress test 11/10/2006   [facility]   Normal echocardiogram 08/19/2007     Past Surgical History: Past Surgical History:  Procedure Laterality Date   ANAL FISSURE REPAIR     CARPAL TUNNEL RELEASE Bilateral    DILATION AND CURETTAGE OF UTERUS     ectopic pregnancy   ROTATOR CUFF REPAIR Right    x 2   TUBAL LIGATION      Allergies: Allergies  Allergen Reactions   Aspirin     Causes stomach bleeds hx of ulcers    Outpatient Meds: Current Outpatient Medications  Medication Sig Dispense Refill   amLODipine (NORVASC) 10 MG tablet TAKE 1 TABLET BY MOUTH EVERY DAY 90 tablet 2   baclofen (LIORESAL) 10 MG tablet TAKE 1 TABLET BY MOUTH TWICE A DAY AS NEEDED FOR MUSCLE SPASMS 180 tablet 1   Cholecalciferol (VITAMIN D-3) 1000 UNITS CAPS Take 1 capsule (1,000 Units total) by mouth daily. 30 capsule 6   diclofenac (VOLTAREN) 75 MG EC tablet TAKE 1 TABLET BY MOUTH TWICE A DAY AS NEEDED 60 tablet 1   hydrOXYzine (ATARAX/VISTARIL) 25 MG tablet TAKE ONE UP TO THREE TIMES A DAY AS NEEDED ANXIETY 60 tablet 2   sertraline (ZOLOFT) 25 MG tablet TAKE 1 TABLET (25 MG TOTAL) BY MOUTH DAILY. 90 tablet 1   traMADol (ULTRAM) 50 MG tablet TAKE 1 TABLET (50 MG TOTAL) BY MOUTH EVERY 12 (TWELVE)  HOURS AS NEEDED. FOR PAIN 30 tablet 0   triamterene-hydrochlorothiazide (MAXZIDE-25) 37.5-25 MG tablet TAKE 1 TABLET BY MOUTH EVERY DAY 90 tablet 1   vitamin B-12 (CYANOCOBALAMIN) 500 MCG tablet Take 500 mcg by mouth daily.     gabapentin (NEURONTIN) 300 MG capsule TAKE 3 CAPSULES BY MOUTH 3 TIMES DAILY. (Patient taking differently: 900 mg 2 (two) times daily as needed. TAKE 3 CAPSULES BY MOUTH 3 TIMES DAILY.) 270 capsule 0   nicotine (NICODERM CQ - DOSED IN MG/24 HR) 7 mg/24hr patch Place 1 patch (7 mg total) onto the skin daily. 28 patch 0   oxycodone (OXY-IR) 5 MG capsule Take 1 capsule (5 mg total) by mouth every 4 (four) hours as needed. (Patient not taking: No sig reported) 30 capsule 0   Zoster Vaccine Adjuvanted [facility]) injection Inject 0.5 ml IM and Repeat in 2 months 0.5 mL 1   Current Facility-Administered Medications  Medication Dose Route Frequency Provider Last Rate Last Admin   0.9 %  sodium chloride infusion  500 mL Intravenous Once Doran Stabler, MD          ___________________________________________________________________ Objective   Exam:  BP (!) 145/96   Pulse  68   Temp 98 F (36.7 C)   Ht 5\' 3"  (1.6 m)   Wt 200 lb (90.7 kg)   LMP 12/01/2001   SpO2 97%   BMI 35.43 kg/m   CV: RRR without murmur, S1/S2 Resp: clear to auscultation bilaterally, normal RR and effort noted GI: soft, no tenderness, with active bowel sounds.[email or web address]   Assessment: Encounter Diagnoses  Name Primary?   Alcoholic cirrhosis of liver without ascites ([place]) Yes   Personal history of colonic polyps      Plan: Colonoscopy for Hx colon polyp EGD - variceal screening (cirrhosis)   The patient is appropriate for an endoscopic procedure in the ambulatory setting.   - Wilfrid Lund, MD    Clinical details last clinic note

## 2021-08-15 NOTE — Progress Notes (Signed)
Called to room to assist during endoscopic procedure.  Patient ID and intended procedure confirmed with present staff. Received instructions for my participation in the procedure from the performing physician.  

## 2021-08-15 NOTE — Op Note (Signed)
[facility] Patient Name: Latoya Swanson Procedure Date: 08/15/2021 8:00 AM MRN: [id number] Endoscopist: Sherilyn Cooter L. Myrtie Neither , MD Age: 70 Referring MD:  Date of Birth: January 11, 1951 Gender: Female Account #: [id number] Procedure:                Upper GI endoscopy Indications:              Cirrhosis rule out esophageal varices                           compensated cirrhosis from previously treated HCV Medicines:                Monitored Anesthesia Care Procedure:                Pre-Anesthesia Assessment:                           - Prior to the procedure, a History and Physical                            was performed, and patient medications and                            allergies were reviewed. The patient's tolerance of                            previous anesthesia was also reviewed. The risks                            and benefits of the procedure and the sedation                            options and risks were discussed with the patient.                            All questions were answered, and informed consent                            was obtained. Prior Anticoagulants: The patient has                            taken no previous anticoagulant or antiplatelet                            agents. ASA Grade Assessment: II - A patient with                            mild systemic disease. After reviewing the risks                            and benefits, the patient was deemed in                            satisfactory condition to undergo the procedure.  After obtaining informed consent, the endoscope was                            passed under direct vision. Throughout the                            procedure, the patient's blood pressure, pulse, and                            oxygen saturations were monitored continuously. The                            GIF [id number] #[id number] was introduced through the                            mouth, and advanced to  the second part of duodenum.                            The upper GI endoscopy was accomplished without                            difficulty. The patient tolerated the procedure                            (coughing). Scope In: Scope Out: Findings:                 The larynx was normal.                           There is no endoscopic evidence of Barrett's                            esophagus, esophagitis or varices in the entire                            esophagus.                           There is no endoscopic evidence of varices in the                            cardia and in the gastric fundus.                           Diffuse inflammation characterized by erosions,                            erythema and granularity was found in the gastric                            body and in the gastric antrum. Multiple biopsies                            were obtained on the greater curvature of the  gastric body, on the lesser curvature of the                            gastric body, on the greater curvature of the                            gastric antrum and on the lesser curvature of the                            gastric antrum with cold forceps for histology. In                            pre-pyloric area, had a non-bleeding or friable                            GAVE-like appearance.                           The cardia and gastric fundus were normal on                            retroflexion.                           Patchy mildly erythematous mucosa was found in the                            duodenal bulb. Complications:            No immediate complications. Estimated Blood Loss:     Estimated blood loss was minimal. Impression:               - Normal larynx.                           - Gastritis.                           - Erythematous duodenopathy.                           - Multiple biopsies were obtained on the greater                             curvature of the gastric body, on the lesser                            curvature of the gastric body, on the greater                            curvature of the gastric antrum and on the lesser                            curvature of the gastric antrum. Recommendation:           - Patient has a contact number available for  emergencies. The signs and symptoms of potential                            delayed complications were discussed with the                            patient. Return to normal activities tomorrow.                            Written discharge instructions were provided to the                            patient.                           - Resume previous diet.                           - Continue present medications.                           - Await pathology results.                           - Repeat upper endoscopy in 3 years for screening                            purposes.                           - See the other procedure note for documentation of                            additional recommendations. Deriona Altemose L. Myrtie Neither, MD 08/15/2021 8:46:26 AM This report has been signed electronically.

## 2021-08-15 NOTE — Op Note (Signed)
[facility] Patient Name: Latoya Swanson Procedure Date: 08/15/2021 8:01 AM MRN: [id number] Endoscopist: Sherilyn Cooter L. Myrtie Neither , MD Age: 70 Referring MD:  Date of Birth: Nov 17, 1950 Gender: Female Account #: [id number] Procedure:                Colonoscopy Indications:              Surveillance: Personal history of adenomatous                            polyps on last colonoscopy > 5 years ago                           adenomatous polyp 2009 Medicines:                Monitored Anesthesia Care Procedure:                Pre-Anesthesia Assessment:                           - Prior to the procedure, a History and Physical                            was performed, and patient medications and                            allergies were reviewed. The patient's tolerance of                            previous anesthesia was also reviewed. The risks                            and benefits of the procedure and the sedation                            options and risks were discussed with the patient.                            All questions were answered, and informed consent                            was obtained. Prior Anticoagulants: The patient has                            taken no previous anticoagulant or antiplatelet                            agents. ASA Grade Assessment: II - A patient with                            mild systemic disease. After reviewing the risks                            and benefits, the patient was deemed in  satisfactory condition to undergo the procedure.                           After obtaining informed consent, the colonoscope                            was passed under direct vision. Throughout the                            procedure, the patient's blood pressure, pulse, and                            oxygen saturations were monitored continuously. The                            Olympus CF-HQ190L ([id number]) Colonoscope was                             introduced through the anus and advanced to the the                            cecum, identified by appendiceal orifice and                            ileocecal valve. The colonoscopy was performed                            without difficulty. The patient tolerated the                            procedure well. The quality of the bowel                            preparation was excellent. The ileocecal valve,                            appendiceal orifice, and rectum were photographed. Scope In: 8:05:38 AM Scope Out: 8:22:04 AM Scope Withdrawal Time: 0 hours 13 minutes 21 seconds  Total Procedure Duration: 0 hours 16 minutes 26 seconds  Findings:                 The perianal and digital rectal examinations were                            normal.                           A 12-14 mm polyp was found in the mid ascending                            colon. The polyp was sessile. The polyp was removed                            with a piecemeal technique using a cold snare.  Resection and retrieval were complete.                           The exam was otherwise without abnormality on                            direct and retroflexion views. Complications:            No immediate complications. Estimated Blood Loss:     Estimated blood loss: none. Estimated blood loss                            was minimal. Impression:               - One 12-14 mm polyp in the mid ascending colon,                            removed piecemeal using a cold snare. Resected and                            retrieved.                           - The examination was otherwise normal on direct                            and retroflexion views. Recommendation:           - Patient has a contact number available for                            emergencies. The signs and symptoms of potential                            delayed complications were discussed with the                             patient. Return to normal activities tomorrow.                            Written discharge instructions were provided to the                            patient.                           - Resume previous diet.                           - Continue present medications.                           - Await pathology results.                           - Repeat colonoscopy is recommended for  surveillance. The colonoscopy date will be                            determined after pathology results from today's                            exam become available for review.                           - See the other procedure note for documentation of                            additional recommendations. Adelard Sanon L. Myrtie Neither, MD 08/15/2021 8:39:42 AM This report has been signed electronically.

## 2021-08-19 ENCOUNTER — Telehealth: Payer: Self-pay

## 2021-08-19 ENCOUNTER — Telehealth: Payer: Self-pay | Admitting: *Deleted

## 2021-08-19 NOTE — Telephone Encounter (Signed)
Left message on follow up call. 

## 2021-08-19 NOTE — Telephone Encounter (Signed)
  Follow up Call-  Call back number 08/15/2021  Post procedure Call Back phone  # [phone]  Permission to leave phone message Yes  Some recent data might be hidden     Patient questions:  Do you have a fever, pain , or abdominal swelling? No. Pain Score  0 *  Have you tolerated food without any problems? Yes.    Have you been able to return to your normal activities? Yes.    Do you have any questions about your discharge instructions: Diet   No. Medications  No. Follow up visit  No.  Do you have questions or concerns about your Care? No.  Actions: * If pain score is 4 or above: No action needed, pain <4.  Have you developed a fever since your procedure? no  2.   Have you had an respiratory symptoms (SOB or cough) since your procedure? no  3.   Have you tested positive for COVID 19 since your procedure no  4.   Have you had any family members/close contacts diagnosed with the COVID 19 since your procedure?  no   If yes to any of these questions please route to Joylene John, RN and Joella Prince, RN

## 2021-08-21 ENCOUNTER — Encounter: Payer: Self-pay | Admitting: Gastroenterology

## 2021-08-22 ENCOUNTER — Other Ambulatory Visit: Payer: Self-pay

## 2021-08-22 MED ORDER — OMEPRAZOLE 20 MG PO CPDR: 20.0000 mg | DELAYED_RELEASE_CAPSULE | Freq: Two times a day (BID) | ORAL | 0 refills | Status: DC

## 2021-08-22 MED ORDER — BISMUTH SUBSALICYLATE 262 MG PO TABS: 524.0000 mg | ORAL_TABLET | Freq: Four times a day (QID) | ORAL | 0 refills | Status: AC

## 2021-08-22 MED ORDER — METRONIDAZOLE 500 MG PO TABS: 500.0000 mg | ORAL_TABLET | Freq: Three times a day (TID) | ORAL | 0 refills | Status: AC

## 2021-08-22 MED ORDER — DOXYCYCLINE HYCLATE 100 MG PO CAPS: 100.0000 mg | ORAL_CAPSULE | Freq: Two times a day (BID) | ORAL | 0 refills | Status: AC

## 2021-09-23 ENCOUNTER — Telehealth: Payer: Self-pay

## 2021-09-23 DIAGNOSIS — A048 Other specified bacterial intestinal infections: Secondary | ICD-10-CM

## 2021-09-23 NOTE — Telephone Encounter (Signed)
Lab reminder mailed to patient along with written urea breath test order and [facility] office information.

## 2021-09-23 NOTE — Telephone Encounter (Signed)
-----   Message from Yevette Edwards, RN sent at 08/22/2021 12:14 PM EDT ----- Regarding: Urea breath test Urea breath test due after 11/18 - need to enter order Mail instructions and order to patient

## 2021-10-01 ENCOUNTER — Encounter: Payer: Self-pay | Admitting: Family Medicine

## 2021-10-01 ENCOUNTER — Other Ambulatory Visit: Payer: Self-pay | Admitting: Family Medicine

## 2021-10-01 ENCOUNTER — Ambulatory Visit (INDEPENDENT_AMBULATORY_CARE_PROVIDER_SITE_OTHER): Payer: Medicare HMO | Admitting: Family Medicine

## 2021-10-01 ENCOUNTER — Other Ambulatory Visit: Payer: Self-pay | Admitting: *Deleted

## 2021-10-01 ENCOUNTER — Other Ambulatory Visit: Payer: Self-pay

## 2021-10-01 DIAGNOSIS — F321 Major depressive disorder, single episode, moderate: Secondary | ICD-10-CM

## 2021-10-01 DIAGNOSIS — N644 Mastodynia: Secondary | ICD-10-CM | POA: Diagnosis not present

## 2021-10-01 DIAGNOSIS — Z720 Tobacco use: Secondary | ICD-10-CM

## 2021-10-01 NOTE — Progress Notes (Signed)
    SUBJECTIVE:   CHIEF COMPLAINT / HPI:   Breast Pain Continues to have pain in her bilateral breasts especially when pressed.  Her separate chest wall pain is improved.  Her breast pain is not.  Recent mammogram is normal.   No masses or skin changes or discharge   Does have intermittent hot flashes  Depression Feels this is worse.  Feels down especially with recent death of sister.  Taking her sertraline daily.  Does not feel is helping as much.  No suicidal ideation.  Is trying to stay active.  Not interested in counseling   Tobacco Continues to smoke same amount.  She did not try [place] Quit.  Would like help with patches     PERTINENT  PMH / PSH: Taking treatment for H pylori  OBJECTIVE:   BP 140/82   Pulse 68   Wt 204 lb 6.4 oz (92.7 kg)   LMP 12/01/2001   SpO2 98%   BMI 36.21 kg/m   Psych:  Cognition and judgment appear intact. Alert, communicative  and cooperative with normal attention span and concentration. No apparent delusions, illusions, hallucinations   ASSESSMENT/PLAN:   Breast pain Unsure of cause.  Normal mammogram and no red flag symptoms.  Will try adjusting diet wit coffee and chocolate.    Depression Worsened.  Will increase sertraline and encourage activity   Tobacco use Again suggested [place] Quit Line      [name], MD [place]

## 2021-10-01 NOTE — Assessment & Plan Note (Signed)
Worsened.  Will increase sertraline and encourage activity

## 2021-10-01 NOTE — Assessment & Plan Note (Signed)
Again suggested [place] [facility] [employer]

## 2021-10-01 NOTE — Assessment & Plan Note (Signed)
Unsure of cause.  Normal mammogram and no red flag symptoms.  Will try adjusting diet wit coffee and chocolate.

## 2021-10-01 NOTE — Patient Instructions (Signed)
Good to see you today - Thank you for coming in  Things we discussed today:  Depression - Increase sertaline to 2 tabs every day - Keep physically active   Breast Pain - cut back on coffee and chocolate  You need to stop smoking!  Call [place] (1-800-QUIT-NOW) for ideas  Choose a quit date when you will stop completely.  Get prepared by slowly cutting down.  Find a substitute to use when you think you need a cigarette.  If you wish to discuss nicotine replacement (patches, gum) please make an appointment   Please always bring your medication bottles  Come back to see me in 2 months

## 2021-10-11 ENCOUNTER — Other Ambulatory Visit: Payer: Self-pay | Admitting: Family Medicine

## 2021-10-24 ENCOUNTER — Ambulatory Visit: Payer: Medicare HMO | Admitting: Gastroenterology

## 2021-10-24 NOTE — Progress Notes (Deleted)
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  HCV cleared with antiviral therapy under the direction of infectious disease clinic in 2016/17.    Compensated cirrhosis, no clear evidence of ascites, no history of GI bleeding, no encephalopathy.  [place] overdue for hepatoma screening. We discussed cirrhosis and the need for both variceal screening and at least every 7-month follow-up for surveillance of the cirrhosis and liver cancer screening.  She is back living in this area and had her sister moved here to rehab after the stroke, so she plans to be here permanently and is committed to her health maintenance.   History of colon polyp in 2009, [place] overdue for surveillance colonoscopy.  Family history of colon cancer in mother at advanced age.   Plan:   Labs today: CBC, CMP, PT/INR, AFP   Abdominal ultrasound to be scheduled.  Indication to screen for hepatoma and evaluate for ascites in patient with cirrhosis   Upper endoscopy to screen for esophageal varices.  She had described some black stool couple of months ago, not clear if that was blood.  Certainly will be evaluated at that time. Surveillance colonoscopy. _______________________________ EGD and colonoscopy October 6 Colonoscopy with 12 to 14 mm tubular adenoma removed EGD with striped antral erythema, biopsies positive for H. pylori.  No esophageal or gastric varices.   Treated with bismuth based quadruple therapy and orders for posttreatment UBT (which does not appear to have been done).   ***  ROS: Cardiovascular:  no chest pain Respiratory: no dyspnea  The patient's Past Medical, Family and Social History were reviewed and are on file in the EMR.  Objective:  Med list reviewed  Current Outpatient Medications:    amLODipine (NORVASC) 10 MG tablet,  TAKE 1 TABLET BY MOUTH EVERY DAY, Disp: 90 tablet, Rfl: 2   Cholecalciferol (VITAMIN D-3) 1000 UNITS CAPS, Take 1 capsule (1,000 Units total) by mouth daily., Disp: 30 capsule, Rfl: 6   diclofenac (VOLTAREN) 75 MG EC tablet, TAKE 1 TABLET BY MOUTH TWICE A DAY AS NEEDED, Disp: 60 tablet, Rfl: 0   gabapentin (NEURONTIN) 300 MG capsule, TAKE 3 CAPSULES BY MOUTH 3 TIMES DAILY. (Patient taking differently: 900 mg 2 (two) times daily as needed. TAKE 3 CAPSULES BY MOUTH 3 TIMES DAILY.), Disp: 270 capsule, Rfl: 0   hydrOXYzine (ATARAX/VISTARIL) 25 MG tablet, TAKE ONE UP TO THREE TIMES A DAY AS NEEDED ANXIETY, Disp: 60 tablet, Rfl: 2   nicotine (NICODERM CQ - DOSED IN MG/24 HR) 7 mg/24hr patch, Place 1 patch (7 mg total) onto the skin daily. (Patient not taking: Reported on 10/01/2021), Disp: 28 patch, Rfl: 0   omeprazole (PRILOSEC) 20 MG capsule, Take 1 capsule (20 mg total) by mouth 2 (two) times daily before a meal for 14 days., Disp: 28 capsule, Rfl: 0   oxycodone (OXY-IR) 5 MG capsule, Take 1 capsule (5 mg total) by mouth every 4 (four) hours as needed. (Patient not taking: No sig reported), Disp: 30 capsule, Rfl: 0   sertraline (ZOLOFT) 25 MG tablet, Take 2 tablets (50 mg total) by mouth daily., Disp: 90 tablet, Rfl: 1   traMADol (ULTRAM) 50 MG tablet, TAKE 1 TABLET (50 MG TOTAL) BY MOUTH EVERY 12 (TWELVE) HOURS AS NEEDED. FOR PAIN, Disp: 30 tablet, Rfl: 0   triamterene-hydrochlorothiazide (MAXZIDE-25) 37.5-25 MG tablet, TAKE 1 TABLET  BY MOUTH EVERY DAY, Disp: 90 tablet, Rfl: 1   vitamin B-12 (CYANOCOBALAMIN) 500 MCG tablet, Take 500 mcg by mouth daily., Disp: , Rfl:    Zoster Vaccine Adjuvanted (SHINGRIX) injection, Inject 0.5 ml IM and Repeat in 2 months, Disp: 0.5 mL, Rfl: 1   Vital signs in last 24 hrs: There were no vitals filed for this visit. Wt Readings from Last 3 Encounters:  10/01/21 204 lb 6.4 oz (92.7 kg)  08/15/21 200 lb (90.7 kg)  06/26/21 201 lb 9.6 oz (91.4 kg)    Physical  Exam  *** HEENT: sclera anicteric, oral mucosa moist without lesions Neck: supple, no thyromegaly, JVD or lymphadenopathy Cardiac: RRR without murmurs, S1S2 heard, no peripheral edema Pulm: clear to auscultation bilaterally, normal RR and effort noted Abdomen: soft, *** tenderness, with active bowel sounds. No guarding or palpable hepatosplenomegaly. Skin; warm and dry, no jaundice or rash  Labs:  Labs from June of this year:  Normal CBC, CMP, INR, AFP ___________________________________________ Radiologic studies: CLINICAL DATA:  Cirrhosis screening of liver.  Hepatitis C   EXAM: ABDOMEN ULTRASOUND COMPLETE   COMPARISON:  CT abdomen pelvis 04/30/2012, ultrasound abdomen 11/09/2015   FINDINGS: Gallbladder: No gallstones or wall thickening visualized. No sonographic Murphy sign noted by sonographer.   Common bile duct: Diameter: 4 mm   Liver: Slightly nodular hepatic contour. No focal lesion identified. Increased parenchymal echogenicity. Portal vein is patent on color Doppler imaging with normal direction of blood flow towards the liver.   IVC: No abnormality visualized.   Pancreas: Visualized portion unremarkable.   Spleen: Size and appearance within normal limits.   Right Kidney: Length: 12.2 cm. Echogenicity within normal limits. No mass or hydronephrosis visualized.   Left Kidney: Length: 10.6 cm. Echogenicity within normal limits. No mass or hydronephrosis visualized.   Abdominal aorta: No aneurysm visualized.   Other findings: None.   IMPRESSION: Cirrhosis and hepatic steatosis. Please note limited evaluation for focal hepatic masses in a patient with hepatic steatosis due to decreased penetration of the acoustic ultrasound waves. Recommend MRI liver protocol for further evaluation.     Electronically Signed   By: Iven Finn M.D.   On: 05/15/2021 20:23   ____________________________________________ Other: 1. Surgical [P], colon, ascending,  polyp (1) - TUBULAR ADENOMA(S). - NO HIGH GRADE DYSPLASIA OR CARCINOMA. 2. Surgical [P], gastric antrum and gastric body - MILDLY ACTIVE HELICOBACTER PYLORI GASTRITIS. - WARTHIN-STARRY POSITIVE FOR HELICOBACTER PYLORI. - NO INTESTINAL METAPLASIA, DYSPLASIA OR CARCINOMA.  _____________________________________________ Assessment & Plan  Assessment: No diagnosis found.    Plan:   *** minutes were spent on this encounter (including chart review, history/exam, counseling/coordination of care, and documentation) > 50% of that time was spent on counseling and coordination of care.   Nelida Meuse III

## 2021-11-07 ENCOUNTER — Ambulatory Visit
Admission: RE | Admit: 2021-11-07 | Discharge: 2021-11-07 | Disposition: A | Discharge status: Home / Self Care | Payer: Medicare HMO | Source: Ambulatory Visit | Attending: Family Medicine | Admitting: Family Medicine

## 2021-11-07 DIAGNOSIS — Z780 Asymptomatic menopausal state: Secondary | ICD-10-CM | POA: Diagnosis not present

## 2021-11-07 DIAGNOSIS — E2839 Other primary ovarian failure: Secondary | ICD-10-CM

## 2021-11-07 DIAGNOSIS — M85852 Other specified disorders of bone density and structure, left thigh: Secondary | ICD-10-CM | POA: Diagnosis not present

## 2021-11-09 ENCOUNTER — Other Ambulatory Visit: Payer: Self-pay | Admitting: Family Medicine

## 2021-11-13 ENCOUNTER — Encounter: Payer: Self-pay | Admitting: Family Medicine

## 2021-11-18 NOTE — Progress Notes (Signed)
° ° °  SUBJECTIVE:   CHIEF COMPLAINT / HPI:   Cough with Wheeze For last 5 days.  Yellow sputum. May have gotten from granddtrs.  No particular shortness of breath.  No leg swelling or fever.  Has stopped smoking.  Has used inhalers in past.  Cholesterol Never taken cholesterol medication Last LFTs were normal   Depression Feeling some better.  Taking sertraline 50 mg daily    PERTINENT  PMH / PSH: husband has diabetes foot infection   OBJECTIVE:   BP (!) 168/90    Pulse 70    Ht 5\' 3"  (1.6 m)    Wt 203 lb 9.6 oz (92.4 kg)    LMP 12/01/2001    SpO2 100%    BMI 36.07 kg/m   Frequent wet cough Lungs mild diffuse wheeze no dullness Normal conversation  Heart - Regular rate and rhythm.  No murmurs, gallops or rubs.    Ears:  External ear exam shows no significant lesions or deformities.  Otoscopic examination reveals clear canals, tympanic membranes are intact bilaterally without bulging, retraction, inflammation or discharge. Hearing is grossly normal bilaterall Throat: normal mucosa, no exudate, uvula midline, no redness No edema   ASSESSMENT/PLAN:   Depression Improved.  Continue current medications   HYPERCHOLESTEROLEMIA, MILD High risk given history of smoking.  Normal lfts but does have history of liver disease.  Start moderate dose and check lfts   Smokers' cough ([place]) Likely has mild copd therefore treat for exacerbation with antibiotics and albuterol.  Will start steroids if worsening      [name], MD [place]

## 2021-11-18 NOTE — Patient Instructions (Addendum)
Good to see you today - Thank you for coming in  Things we discussed today:  For the Cough Infection - Use the albuterol 2 p every 6 hours as needed - Azithromycin - take till all gone - If worsening or any shortness of breath then call me - Robittusin with dexomethorphan for cough  No more smoking - EVER  For the Bones Take one Calcium and vit d tablet every other day Keep up the calcium and the green leafy veggies  Mastodynia is breast pain  Fibrocystic disease  Depression  Keep taking 2 sertraline   Cholesterol Once you are feeling better start the lipitor 40 mg daily Check a blood test 2 weeks after you start -Just call for a lab appt   Please always bring your medication bottles  Come back to see me in  2-3 months

## 2021-11-19 ENCOUNTER — Ambulatory Visit (INDEPENDENT_AMBULATORY_CARE_PROVIDER_SITE_OTHER): Payer: Medicare HMO | Admitting: Family Medicine

## 2021-11-19 ENCOUNTER — Other Ambulatory Visit: Payer: Self-pay

## 2021-11-19 ENCOUNTER — Encounter: Payer: Self-pay | Admitting: Family Medicine

## 2021-11-19 DIAGNOSIS — J410 Simple chronic bronchitis: Secondary | ICD-10-CM | POA: Diagnosis not present

## 2021-11-19 DIAGNOSIS — F321 Major depressive disorder, single episode, moderate: Secondary | ICD-10-CM | POA: Diagnosis not present

## 2021-11-19 DIAGNOSIS — E7800 Pure hypercholesterolemia, unspecified: Secondary | ICD-10-CM | POA: Diagnosis not present

## 2021-11-19 MED ORDER — ALBUTEROL SULFATE HFA 108 (90 BASE) MCG/ACT IN AERS: 2.0000 | INHALATION_SPRAY | Freq: Four times a day (QID) | RESPIRATORY_TRACT | 0 refills | Status: DC | PRN

## 2021-11-19 MED ORDER — AZITHROMYCIN 250 MG PO TABS: ORAL_TABLET | ORAL | 0 refills | Status: DC

## 2021-11-19 MED ORDER — ATORVASTATIN CALCIUM 40 MG PO TABS: 40.0000 mg | ORAL_TABLET | Freq: Every day | ORAL | 3 refills | Status: DC

## 2021-11-19 NOTE — Assessment & Plan Note (Signed)
Likely has mild copd therefore treat for exacerbation with antibiotics and albuterol.  Will start steroids if worsening

## 2021-11-19 NOTE — Assessment & Plan Note (Signed)
High risk given history of smoking.  Normal lfts but does have history of liver disease.  Start moderate dose and check lfts

## 2021-11-19 NOTE — Assessment & Plan Note (Signed)
Improved.  Continue current medications

## 2021-12-10 ENCOUNTER — Other Ambulatory Visit: Payer: Self-pay | Admitting: Family Medicine

## 2021-12-13 ENCOUNTER — Other Ambulatory Visit: Payer: Self-pay | Admitting: Family Medicine

## 2021-12-17 ENCOUNTER — Other Ambulatory Visit: Payer: Self-pay | Admitting: Family Medicine

## 2022-01-13 ENCOUNTER — Ambulatory Visit (INDEPENDENT_AMBULATORY_CARE_PROVIDER_SITE_OTHER): Payer: Medicare HMO | Admitting: Family Medicine

## 2022-01-13 ENCOUNTER — Other Ambulatory Visit: Payer: Self-pay | Admitting: Family Medicine

## 2022-01-13 ENCOUNTER — Other Ambulatory Visit: Payer: Self-pay

## 2022-01-13 ENCOUNTER — Encounter: Payer: Self-pay | Admitting: Family Medicine

## 2022-01-13 DIAGNOSIS — I10 Essential (primary) hypertension: Secondary | ICD-10-CM

## 2022-01-13 DIAGNOSIS — R5383 Other fatigue: Secondary | ICD-10-CM

## 2022-01-13 DIAGNOSIS — M5386 Other specified dorsopathies, lumbar region: Secondary | ICD-10-CM

## 2022-01-13 NOTE — Assessment & Plan Note (Signed)
Unclear of cause.  Does not sem to be mood related or angina or dyspnea.  Could be due to her increased low back pain but she identifies this as being in addition to that.  Check labs for anemia renal/liver failure.  Monitor closely.   ?

## 2022-01-13 NOTE — Assessment & Plan Note (Signed)
Elevated today likely due to pain and extra NSAID.  Monitor for improvement next visit ?

## 2022-01-13 NOTE — Assessment & Plan Note (Addendum)
I think she has an exacerbation of this.  No red flags for cancer or infection or fracture.  Recommend take gabapentin at least 300 mg three times a day can increase to 600 mg three times a day.  Limit diclofenac to twice a day only.  Monitor for improvement ?

## 2022-01-13 NOTE — Progress Notes (Addendum)
? ? ?  SUBJECTIVE:  ? ?CHIEF COMPLAINT / HPI:  ? ?Low Back and Leg Pains ?Similar to exacerbation she has had in past.  Pain in lower back with some radiation down R > L leg.  No weakness or incontinence.  No exacerbating factors noted (falls etc) Taking diclofenac and intermittent gabapentin.  Does not have tramadol or oxyIR as in past ? ?Fatigue ?Notes feeling more tired than usual especially if active moving around.  Does not necessarily have to stop due to discomfort (no chest pain or shortness of breath or palpitations) but cant "go like her usual"  No leg swelling or cough or lightheadness or syncope  ? ?Hypertension ?Took 2 diclofenac tabs this am at once.  Did take her blood pressure medications.  No vision changes.  She feels it is high due to her pain  ? ?PERTINENT  PMH / PSH: chronic intermittent back pain ? ?OBJECTIVE:  ? ?BP (!) 169/107 Comment: 160/88  Pulse 80   Wt 203 lb 9.6 oz (92.4 kg)   LMP 12/01/2001   SpO2 99%   BMI 36.07 kg/m?   ?Mild distress ?Mobility:able to get up and down from exam table but slowly and with some pain ?No SLR ?Distal lower extremity strength intact ?No focal lower back pain to palpation ?Lungs:  Normal respiratory effort, chest expands symmetrically. Lungs are clear to auscultation, no crackles or wheezes. ?Heart - Regular rate and rhythm.  No murmurs, gallops or rubs.    ?No edema  ? ? ?ASSESSMENT/PLAN:  ? ?Sciatica associated with disorder of lumbar spine ?I think she has an exacerbation of this.  No red flags for cancer or infection or fracture.  Recommend take gabapentin at least 300 mg three times a day can increase to 600 mg three times a day.  Limit diclofenac to twice a day only.  Monitor for improvement ? ?Fatigue ?Unclear of cause.  Does not sem to be mood related or angina or dyspnea.  Could be due to her increased low back pain but she identifies this as being in addition to that.  Check labs for anemia renal/liver failure.  Monitor closely.   ? ?Essential  hypertension ?Elevated today likely due to pain and extra NSAID.  Monitor for improvement next visit ?  ? ? ?[name], MD ?[place]  ?

## 2022-01-13 NOTE — Patient Instructions (Signed)
Good to see you today - Thank you for coming in ? ?Things we discussed today: ? ?Leg Back Pain ?Take gabapentin 1-2 capsules three times a day  ?Take diclofenac only one twice a day as needed  ? ?Fatigue ?I will call you if your tests are not good.  Otherwise, I will send you a message on MyChart (if it is active) or a letter in the mail..  If you do not hear from me with in 2 weeks please call our office.    ? ?Please always bring your medication bottles ? ?Come back to see me in 3 weeks ? ?If not better in 3-4 days then call me ?If any sudden shortness of breath or chest pain call 911 ? ? ?

## 2022-01-14 ENCOUNTER — Encounter: Payer: Self-pay | Admitting: Family Medicine

## 2022-01-14 LAB — CMP14+EGFR
ALT: 31 IU/L (ref 0–32)
AST: 33 IU/L (ref 0–40)
Albumin/Globulin Ratio: 1.5 (ref 1.2–2.2)
Albumin: 4.6 g/dL (ref 3.8–4.8)
Alkaline Phosphatase: 114 IU/L (ref 44–121)
BUN/Creatinine Ratio: 13 (ref 12–28)
BUN: 10 mg/dL (ref 8–27)
Bilirubin Total: 0.4 mg/dL (ref 0.0–1.2)
CO2: 23 mmol/L (ref 20–29)
Calcium: 9.4 mg/dL (ref 8.7–10.3)
Chloride: 103 mmol/L (ref 96–106)
Creatinine, Ser: 0.76 mg/dL (ref 0.57–1.00)
Globulin, Total: 3 g/dL (ref 1.5–4.5)
Glucose: 96 mg/dL (ref 70–99)
Potassium: 3.8 mmol/L (ref 3.5–5.2)
Sodium: 145 mmol/L — ABNORMAL HIGH (ref 134–144)
Total Protein: 7.6 g/dL (ref 6.0–8.5)
eGFR: 84 mL/min/{1.73_m2} (ref >59)

## 2022-01-14 LAB — CBC
Hematocrit: 42 % (ref 34.0–46.6)
Hemoglobin: 14.3 g/dL (ref 11.1–15.9)
MCH: 30.4 pg (ref 26.6–33.0)
MCHC: 34 g/dL (ref 31.5–35.7)
MCV: 89 fL (ref 79–97)
Platelets: 199 10*3/uL (ref 150–450)
RBC: 4.71 x10E6/uL (ref 3.77–5.28)
RDW: 12.5 % (ref 11.7–15.4)
WBC: 6.4 10*3/uL (ref 3.4–10.8)

## 2022-01-14 LAB — TSH: TSH: 2.9 u[IU]/mL (ref 0.450–4.500)

## 2022-01-23 ENCOUNTER — Telehealth: Payer: Self-pay

## 2022-01-23 NOTE — Telephone Encounter (Signed)
Patient calls nurse line regarding worsened pain in lower back and legs. Reports compliance with increased gabapentin dosage, however, pain has not improved. Patient reports that she has also been using topical agents that have not been working.  ? ?Patient states that she was told to call back if pain had not improved. Went ahead and scheduled patient for PCP's next available on 3/21.  ? ?Please advise additional recommendations for patient. Also, patient had scheduled follow up visit with PCP in three weeks (02/12/22) per PCP instructions. Given that patient has not had improvement of pain, scheduled additional appointment for 3/21 to discuss pain management options.  ? ?Talbot Grumbling, RN ? ?

## 2022-01-28 ENCOUNTER — Other Ambulatory Visit: Payer: Self-pay

## 2022-01-28 ENCOUNTER — Telehealth: Payer: Self-pay | Admitting: *Deleted

## 2022-01-28 ENCOUNTER — Ambulatory Visit (INDEPENDENT_AMBULATORY_CARE_PROVIDER_SITE_OTHER): Payer: Medicare HMO | Admitting: Family Medicine

## 2022-01-28 ENCOUNTER — Encounter: Payer: Self-pay | Admitting: Family Medicine

## 2022-01-28 ENCOUNTER — Telehealth: Payer: Self-pay | Admitting: Family Medicine

## 2022-01-28 DIAGNOSIS — R5383 Other fatigue: Secondary | ICD-10-CM

## 2022-01-28 DIAGNOSIS — M5386 Other specified dorsopathies, lumbar region: Secondary | ICD-10-CM

## 2022-01-28 MED ORDER — HYDROCODONE-ACETAMINOPHEN 5-325 MG PO TABS: 1.0000 | ORAL_TABLET | Freq: Three times a day (TID) | ORAL | 0 refills | Status: DC | PRN

## 2022-01-28 MED ORDER — HYDROCODONE-ACETAMINOPHEN 7.5-325 MG PO TABS: 1.0000 | ORAL_TABLET | Freq: Four times a day (QID) | ORAL | 0 refills | Status: DC | PRN

## 2022-01-28 NOTE — Progress Notes (Addendum)
? ? ?  SUBJECTIVE:  ? ?CHIEF COMPLAINT / HPI:  ? ?Back Leg Pain  ?Continues to have pain in the middle of her back with radiation down both legs.  Sometimes with just pain in her legs or just in the back.  No weakness or incontinence.  Does feel tired all over.  Having trouble sleeping with the pain.  Continues to be active attending theater etc.   The increased gabapentin 900 mg three times a day helps a little. ? ?PERTINENT  PMH / PSH: Last back imaging was MRI 2018  ? ?OBJECTIVE:  ? ?BP (!) 154/93   Pulse 87   Wt 206 lb 3.2 oz (93.5 kg)   LMP 12/01/2001   SpO2 98%   BMI 36.53 kg/m?   ?Alert moves slowly and is using a cane ?Able to stand and to get on toes and heels with balance assist ?Can do 3 mild knee bends without weakness or pain ?Is tender diffusely over her back both spinous processes and paraspinous muscles ?Recent Labs noted  ? ?ASSESSMENT/PLAN:  ? ?Sciatica associated with disorder of lumbar spine ?Worsened.   Still most consistent with nerve impingement but given her age will check xray. Will add small quantity of hydrocodone to help her keep active.  Encouraged continued activity  ? ?Fatigue ?No signs of weight loss or lab abnormality.  May be due to chronic back pain.  Will treat this and monitor for improvement  ?  ? ? ?[name], MD ?[place]  ?

## 2022-01-28 NOTE — Patient Instructions (Signed)
Good to see you today - Thank you for coming in ? ?Things we discussed today: ? ?Back pain ?- will get an xray ?- consider a chiropracter ?-take the hydrocodone when you have severe pain.  Not more than one per day or every other day ? ?If any weakness or incontinence or worsening fatigue or fever then let me know  ? ?Please always bring your medication bottles ? ?Come back to see me in 2 months or if worsening ?

## 2022-01-28 NOTE — Assessment & Plan Note (Signed)
Worsened.   Still most consistent with nerve impingement but given her age will check xray. Will add small quantity of hydrocodone to help her keep active.  Encouraged continued activity  ?

## 2022-01-28 NOTE — Telephone Encounter (Addendum)
Patient calls because the "oxycodone 5-325 is on backorder".  She is requesting that 10-325 be called in for her. ?  ?I did verify with pharmacy that 5-325 is on backorder ?  ?If the patient needs: ?  ?            An appointment - route response to [facility] admin ?            A callback from clinic staff - route response to your team ?            An emergent response - route to RN team ?  ?Christen Bame, CMA ?

## 2022-01-28 NOTE — Assessment & Plan Note (Signed)
No signs of weight loss or lab abnormality.  May be due to chronic back pain.  Will treat this and monitor for improvement  ?

## 2022-01-28 NOTE — Telephone Encounter (Signed)
Patient is requesting Hydrocodone dosage to be changed from 5.325 to 10.325 because [facility] does not have the lower dosage,  there is a shortage of the 5.325.  Pt stating she is in severe pain   ?

## 2022-01-28 NOTE — Telephone Encounter (Signed)
Sent in new rx for 7.5 mg  ?

## 2022-01-29 MED ORDER — SERTRALINE HCL 25 MG PO TABS: 50.0000 mg | ORAL_TABLET | Freq: Every day | ORAL | 1 refills | Status: DC

## 2022-01-30 NOTE — Telephone Encounter (Signed)
Sent in 7.5 mg tabs 2 days ago ?

## 2022-02-06 ENCOUNTER — Ambulatory Visit
Admission: RE | Admit: 2022-02-06 | Discharge: 2022-02-06 | Disposition: A | Discharge status: Home / Self Care | Payer: Medicare HMO | Source: Ambulatory Visit | Attending: Family Medicine | Admitting: Family Medicine

## 2022-02-06 DIAGNOSIS — M5386 Other specified dorsopathies, lumbar region: Secondary | ICD-10-CM

## 2022-02-06 DIAGNOSIS — M5136 Other intervertebral disc degeneration, lumbar region: Secondary | ICD-10-CM | POA: Diagnosis not present

## 2022-02-06 DIAGNOSIS — M545 Low back pain, unspecified: Secondary | ICD-10-CM | POA: Diagnosis not present

## 2022-02-12 ENCOUNTER — Ambulatory Visit: Payer: Medicare HMO | Admitting: Family Medicine

## 2022-02-19 ENCOUNTER — Other Ambulatory Visit: Payer: Self-pay

## 2022-02-19 ENCOUNTER — Ambulatory Visit (INDEPENDENT_AMBULATORY_CARE_PROVIDER_SITE_OTHER): Payer: Medicare HMO

## 2022-02-19 ENCOUNTER — Ambulatory Visit (HOSPITAL_COMMUNITY)
Admission: EM | Admit: 2022-02-19 | Discharge: 2022-02-19 | Disposition: A | Discharge status: Home / Self Care | Payer: Medicare HMO | Attending: Physician Assistant | Admitting: Physician Assistant

## 2022-02-19 ENCOUNTER — Encounter (HOSPITAL_COMMUNITY): Payer: Self-pay | Admitting: Emergency Medicine

## 2022-02-19 DIAGNOSIS — M25521 Pain in right elbow: Secondary | ICD-10-CM

## 2022-02-19 DIAGNOSIS — M79601 Pain in right arm: Secondary | ICD-10-CM

## 2022-02-19 DIAGNOSIS — W19XXXA Unspecified fall, initial encounter: Secondary | ICD-10-CM | POA: Diagnosis not present

## 2022-02-19 DIAGNOSIS — Z043 Encounter for examination and observation following other accident: Secondary | ICD-10-CM | POA: Diagnosis not present

## 2022-02-19 DIAGNOSIS — I10 Essential (primary) hypertension: Secondary | ICD-10-CM | POA: Diagnosis not present

## 2022-02-19 MED ORDER — HYDROCODONE-ACETAMINOPHEN 5-325 MG PO TABS: 1.0000 | ORAL_TABLET | Freq: Two times a day (BID) | ORAL | 0 refills | Status: AC | PRN

## 2022-02-19 NOTE — ED Provider Notes (Signed)
?[place] ? ? ? ?CSN: [id number] ?Arrival date & time: 02/19/22  1343 ? ? ?  ? ?History   ?Chief Complaint ?Chief Complaint  ?Patient presents with  ? Arm Injury  ? Fall  ? ? ?HPI ?Latoya Swanson is a 71 y.o. female.  ? ?Patient presents today with a several day history of right forearm and elbow pain following injury.  Reports that when she was getting up at night to urinate she took a misstep causing her to fall onto her right arm/elbow.  She has had ongoing pain since that time.  She denies any head injury, loss of consciousness, headache, dizziness, amnesia surrounding event, nausea, vomiting associated with fall.  She reports pain is rated 9 on a 0-10 pain scale, localized to ulnar right arm with radiation into elbow, described as a sharp, worse with palpation or movement, no alleviating factors notified.  She denies any numbness, tingling, paresthesias involving her hand.  She does report pain shoots into the hand and shoulder/neck.  She has tried ice, heat, topical medication without improvement of symptoms. ? ?Patient is hypertensive on intake today.  She is currently prescribed several antihypertensive medications and reports taking these as prescribed.  She denies any chest pain, shortness of breath, headache, vision change, dizziness.  She denies any increased sodium consumption, caffeine use, NSAID use, decongestant use.  She is followed closely by her PCP. ? ? ?Past Medical History:  ?Diagnosis Date  ? Alcoholism ([place])   ? Anxiety   ? Arthritis   ? Cirrhosis ([place])   ? Colon polyps   ? Colon polyps   ? Depression   ? GERD (gastroesophageal reflux disease)   ? Hepatitis C   ? treated and cured  ? History of bleeding peptic ulcer   ? History of MRI of spine   ? Right foraminal stenosis at C2-3 due to spurring.  ? History of rectal fissure   ? Hypertension   ? Normal cardiac stress test 11/10/2006  ? Wynonia Lawman  ? Normal echocardiogram 08/19/2007  ? ? ?Patient Active Problem List  ? Diagnosis Date  Noted  ? Smokers' cough ([place]) 11/19/2021  ? Chronic pain syndrome 09/15/2017  ? 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 Abnormal gall bladder diagnostic imaging 10/24/2015  ? Fatigue   ? Hepatic cirrhosis ([place]) 07/18/2015  ? Tobacco use 03/03/2012  ? Breast pain 06/23/2011  ? 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 Cervical radiculitis 06/29/2009  ? OVERWEIGHT 03/08/2009  ? CARPAL TUNNEL SYNDROME, LEFT 07/13/2008  ? Depression 07/05/2007  ? Essential hypertension 07/05/2007  ? GERD 07/05/2007  ? HYPERCHOLESTEROLEMIA, MILD 03/05/2007  ? Chest wall pain 03/05/2007  ? ? ?Past Surgical History:  ?Procedure Laterality Date  ? ANAL FISSURE REPAIR    ? CARPAL TUNNEL RELEASE Bilateral   ? DILATION AND CURETTAGE OF UTERUS    ? ectopic pregnancy  ? ROTATOR CUFF REPAIR Right   ? x 2  ? TUBAL LIGATION    ? ? ?OB History   ?No obstetric history on file. ?  ? ? ? ?Home Medications   ? ?Prior to Admission medications   ?Medication Sig Start Date End Date Taking? Authorizing Provider  ?HYDROcodone-acetaminophen (NORCO/VICODIN) 5-325 MG tablet Take 1 tablet by mouth 2 (two) times daily as needed for up to 3 days. 02/19/22 02/22/22 Yes Jayston Trevino K, PA-C  ?albuterol (VENTOLIN HFA) 108 (90 Base) MCG/ACT inhaler INHALE 2 PUFFS INTO THE LUNGS EVERY 6 HOURS AS NEEDED  FOR WHEEZE 12/17/21   [name], MD  ?amLODipine (NORVASC) 10 MG tablet TAKE 1 TABLET BY MOUTH EVERY DAY 06/25/21   [name], MD  ?atorvastatin (LIPITOR) 40 MG tablet Take 1 tablet (40 mg total) by mouth daily. 11/19/21   [name], MD  ?Cholecalciferol (VITAMIN D-3) 1000 UNITS CAPS Take 1 capsule (1,000 Units total) by mouth daily. 02/24/13   [name], MD  ?diclofenac (VOLTAREN) 75 MG EC tablet TAKE 1 TABLET BY MOUTH TWICE A DAY AS NEEDED 01/13/22   [name], MD  ?gabapentin (NEURONTIN) 300 MG capsule TAKE 1-2 CAPSULES BY MOUTH 3 TIMES DAILY as needed . 01/13/22   [name], MD  ?hydrOXYzine  (ATARAX) 25 MG tablet TAKE ONE UP TO THREE TIMES A DAY AS NEEDED ANXIETY 12/13/21   [name], MD  ?omeprazole (PRILOSEC) 20 MG capsule Take 1 capsule (20 mg total) by mouth 2 (two) times daily before a meal for 14 days. 08/22/21 01/28/22  Doran Stabler, MD  ?sertraline (ZOLOFT) 25 MG tablet Take 2 tablets (50 mg total) by mouth daily. 01/29/22   [name], MD  ?triamterene-hydrochlorothiazide (MAXZIDE-25) 37.5-25 MG tablet TAKE 1 TABLET BY MOUTH EVERY DAY 12/10/21   [name], MD  ?vitamin B-12 (CYANOCOBALAMIN) 500 MCG tablet Take 500 mcg by mouth daily.    [provider]  ? ? ?Family History ?Family History  ?Problem Relation Age of Onset  ? Breast cancer Mother   ?     died age 95  ? Colon cancer Mother   ? Arthritis Mother   ? Alzheimer's disease Mother   ? Coronary artery disease Father   ?     died age 1  ? Stomach cancer Father   ? Arthritis Father   ? Stroke Sister   ? Heart failure Sister   ? Hypertension Sister   ? Arthritis Maternal Grandmother   ? Arthritis Paternal Grandmother   ? Breast cancer Paternal Grandmother   ? Alzheimer's disease Maternal Aunt   ?     x5  ? Breast cancer Other   ?     PGA  ? ? ?Social History ?Social History  ? ?Tobacco Use  ? Smoking status: Every Day  ?  Packs/day: 0.25  ?  Types: Cigarettes  ?  Last attempt to quit: 02/20/2013  ?  Years since quitting: 9.0  ? Smokeless tobacco: Never  ? Tobacco comments:  ?  2-3 cigs a day  ?Vaping Use  ? Vaping Use: Never used  ?Substance Use Topics  ? Alcohol use: Not Currently  ?  Comment: glass of wine occasionally  ? Drug use: No  ? ? ? ?Allergies   ?Aspirin ? ? ?Review of Systems ?Review of Systems  ?Constitutional:  Positive for activity change. Negative for appetite change, fatigue and fever.  ?Eyes:  Negative for visual disturbance.  ?Respiratory:  Negative for shortness of breath.   ?Cardiovascular:  Negative for chest pain, palpitations and leg swelling.  ?Gastrointestinal:  Negative  for abdominal pain, diarrhea, nausea and vomiting.  ?Musculoskeletal:  Positive for arthralgias and myalgias. Negative for back pain and neck pain.  ?Skin:  Negative for color change and wound.  ?Neurological:  Negative for dizziness, weakness, light-headedness and headaches.  ? ? ?Physical Exam ?Triage Vital Signs ?ED Triage Vitals  ?Enc Vitals Group  ?   BP 02/19/22 1543 (S) (!) 175/96  ?   Pulse Rate 02/19/22 1543  71  ?   Resp 02/19/22 1543 18  ?   Temp 02/19/22 1543 97.7 ?F (36.5 ?C)  ?   Temp Source 02/19/22 1543 Oral  ?   SpO2 02/19/22 1543 100 %  ?   Weight 02/19/22 1544 206 lb 2.1 oz (93.5 kg)  ?   Height 02/19/22 1544 '5\' 3"'$  (1.6 m)  ?   Head Circumference --   ?   Peak Flow --   ?   Pain Score 02/19/22 1544 10  ?   Pain Loc --   ?   Pain Edu? --   ?   Excl. in [place]? --   ? ?No data found. ? ?Updated Vital Signs ?BP (S) (!) 175/96 (BP Location: Left Arm) Comment: hx of htn  Pulse 71   Temp 97.7 ?F (36.5 ?C) (Oral)   Resp 18   Ht '5\' 3"'$  (1.6 m)   Wt 206 lb 2.1 oz (93.5 kg)   LMP 12/01/2001   SpO2 100%   BMI 36.51 kg/m?  ? ?Visual Acuity ?Right Eye Distance:   ?Left Eye Distance:   ?Bilateral Distance:   ? ?Right Eye Near:   ?Left Eye Near:    ?Bilateral Near:    ? ?Physical Exam ?Vitals reviewed.  ?Constitutional:   ?   General: She is awake. She is not in acute distress. ?   Appearance: Normal appearance. She is well-developed. She is not ill-appearing.  ?   Comments: Very pleasant female appears stated age in no acute distress sitting comfortably in exam room  ?HENT:  ?   Head: Normocephalic and atraumatic.  ?Cardiovascular:  ?   Rate and Rhythm: Normal rate and regular rhythm.  ?   Heart sounds: Normal heart sounds, S1 normal and S2 normal. No murmur heard. ?Pulmonary:  ?   Effort: Pulmonary effort is normal.  ?   Breath sounds: Normal breath sounds. No wheezing, rhonchi or rales.  ?   Comments: Clear to auscultation bilaterally ?Musculoskeletal:  ?   Right elbow: No swelling. Decreased range of  motion. Tenderness present in medial epicondyle.  ?   Right forearm: Tenderness and bony tenderness present. No swelling or deformity.  ?   Comments: Right elbow/arm: Decreased range of motion with flexion and extension.

## 2022-02-19 NOTE — ED Triage Notes (Signed)
Pt reports right arm pain after a fall while going to the bathroom 2 nights ago. States she noticed swelling and used ice and heat application. States pain increases with movement.  ?

## 2022-02-19 NOTE — Discharge Instructions (Signed)
Your x-ray did not show any current fracture which is great news.  I do think you should follow-up with orthopedics given your ongoing pain.  Please call to schedule an appointment.  Continue with rest, elevation, compression.  I have called in 6 doses of hydrocodone for pain relief.  This will make you sleepy so do not drive or drink alcohol with taking it.  We have placed you in a sling for comfort but as we discussed you should not wear this on a regular basis as it can actually cause muscle weakness and delayed healing.  If you have any worsening symptoms you need to be seen immediately. ? ?Your blood pressure is very elevated.  Please monitor this at home.  Follow-up with your PCP within a few days.  If you develop any chest pain, shortness of breath, headache, vision change in the setting of high blood pressure you need to go to the emergency room.  Avoid caffeine, sodium, decongestants, NSAIDs (aspirin, ibuprofen/Advil, naproxen/Aleve). ?

## 2022-02-21 ENCOUNTER — Telehealth: Payer: Self-pay | Admitting: Family Medicine

## 2022-02-21 NOTE — Telephone Encounter (Signed)
Patient returns call to nurse line. ? ?Patient reports she was in her closet and tripped over something and sprained her arm.  ? ?Patient rescheduled for 6/6. ? ? ?

## 2022-02-21 NOTE — Telephone Encounter (Signed)
Left VM that I was calling to see how she was ? ?Also need to reschedule her next office visit form 5/31 to 6/6 or 6/7 ?

## 2022-02-25 ENCOUNTER — Other Ambulatory Visit: Payer: Self-pay | Admitting: Family Medicine

## 2022-03-14 ENCOUNTER — Other Ambulatory Visit: Payer: Self-pay | Admitting: Family Medicine

## 2022-03-18 DIAGNOSIS — H2513 Age-related nuclear cataract, bilateral: Secondary | ICD-10-CM | POA: Diagnosis not present

## 2022-04-08 ENCOUNTER — Other Ambulatory Visit: Payer: Self-pay | Admitting: Family Medicine

## 2022-04-09 ENCOUNTER — Ambulatory Visit: Payer: Medicare HMO | Admitting: Family Medicine

## 2022-04-11 ENCOUNTER — Other Ambulatory Visit: Payer: Self-pay | Admitting: Family Medicine

## 2022-04-15 ENCOUNTER — Ambulatory Visit: Payer: Medicare HMO | Admitting: Family Medicine

## 2022-04-22 ENCOUNTER — Ambulatory Visit: Payer: Medicare HMO | Admitting: Family Medicine

## 2022-04-22 NOTE — Progress Notes (Deleted)
    SUBJECTIVE:   CHIEF COMPLAINT / HPI:   R arm pain after fall on 4/12  March visit Sciatica associated with disorder of lumbar spine Worsened.   Still most consistent with nerve impingement but given her age will check xray. Will add small quantity of hydrocodone to help her keep active.  Encouraged continued activity    Fatigue No signs of weight loss or lab abnormality.  May be due to chronic back pain.  Will treat this and monitor for improvement   Depression  Smoking  Screen lung ca    Osteopenia  Breast Chest Pain    PERTINENT  PMH / PSH: ***  OBJECTIVE:   LMP 12/01/2001   ***  ASSESSMENT/PLAN:   No problem-specific Assessment & Plan notes found for this encounter.     [name], MD [place]

## 2022-04-28 NOTE — Progress Notes (Unsigned)
  Grief Husband died unexpectedly a few weeks ago.  She continues to be very upset nervous not sleeping well.  Lives alone but a neighbor checks on her frequently. Family is involved but are out of state.  She has not felt safe driving since.  Eating ok but feels she needs to get more together to be able to deal with everything.  Taking sertraline regularly.  Takes hydroxyzine but does not help much.  Major problem is sleep  No chest pain or shortness of breath with exertion and her chronic pain is actually imrpoved    OBJECTIVE:   BP 136/82   Pulse 85   Wt 197 lb 9.6 oz (89.6 kg)   LMP 12/01/2001   SpO2 98%   BMI 35.00 kg/m   Interactive, tearful,   ASSESSMENT/PLAN:   Grief With husband recent death.  Start trazadone and increase as needed for sleep.  Continue sertraline and hydroxyzine.  She will consider counseling,  See back soon    Patient Instructions  So sorry for your loss   Things we discussed today:  For the Mood - Take trazadone 1/2-2 tabs at bed time - Take sertraline - Zoloft 2 tabs each morning - Use hydroxyzine 1 tab as needed up to three times a day    Please always bring your medication bottles  Come back to see me in 2 weeks - call if needed before    [name], [place]

## 2022-04-29 ENCOUNTER — Encounter: Payer: Self-pay | Admitting: Family Medicine

## 2022-04-29 ENCOUNTER — Ambulatory Visit (INDEPENDENT_AMBULATORY_CARE_PROVIDER_SITE_OTHER): Payer: Medicare HMO | Admitting: Family Medicine

## 2022-04-29 ENCOUNTER — Other Ambulatory Visit: Payer: Self-pay

## 2022-04-29 DIAGNOSIS — F4321 Adjustment disorder with depressed mood: Secondary | ICD-10-CM | POA: Diagnosis not present

## 2022-04-29 MED ORDER — TRAZODONE HCL 50 MG PO TABS: ORAL_TABLET | ORAL | 1 refills | Status: DC

## 2022-04-29 MED ORDER — HYDROXYZINE HCL 25 MG PO TABS: ORAL_TABLET | ORAL | 1 refills | Status: DC

## 2022-04-29 NOTE — Patient Instructions (Signed)
So sorry for your loss   Things we discussed today:  For the Mood - Take trazadone 1/2-2 tabs at bed time - Take sertraline - Zoloft 2 tabs each morning - Use hydroxyzine 1 tab as needed up to three times a day    Please always bring your medication bottles  Come back to see me in 2 weeks - call if needed before

## 2022-04-29 NOTE — Assessment & Plan Note (Signed)
With husband recent death.  Start trazadone and increase as needed for sleep.  Continue sertraline and hydroxyzine.  She will consider counseling,  See back soon

## 2022-05-14 ENCOUNTER — Ambulatory Visit: Payer: Medicare HMO | Admitting: Family Medicine

## 2022-05-14 NOTE — Progress Notes (Deleted)
    SUBJECTIVE:   CHIEF COMPLAINT / HPI:   Grief Husband died unexpectedly a few weeks ago.  She continues to be very upset nervous not sleeping well.  Lives alone but a neighbor checks on her frequently. Family is involved but are out of state.  She has not felt safe driving since.  Eating ok but feels she needs to get more together to be able to deal with everything.  Taking sertraline regularly.  Takes hydroxyzine but does not help much.  Major problem is sleep  Started trazadone last visit    PERTINENT  PMH / PSH: ***  OBJECTIVE:   LMP 12/01/2001   ***  ASSESSMENT/PLAN:   No problem-specific Assessment & Plan notes found for this encounter.     [name], MD [place]

## 2022-05-17 ENCOUNTER — Other Ambulatory Visit: Payer: Self-pay

## 2022-05-17 ENCOUNTER — Encounter (HOSPITAL_COMMUNITY): Payer: Self-pay

## 2022-05-17 ENCOUNTER — Inpatient Hospital Stay (HOSPITAL_COMMUNITY)
Admission: EM | Admit: 2022-05-17 | Discharge: 2022-05-19 | DRG: 394 | Disposition: A | Discharge status: Home / Self Care | Payer: Medicare HMO | Attending: Internal Medicine | Admitting: Internal Medicine

## 2022-05-17 ENCOUNTER — Emergency Department (HOSPITAL_COMMUNITY): Payer: Medicare HMO

## 2022-05-17 DIAGNOSIS — M1990 Unspecified osteoarthritis, unspecified site: Secondary | ICD-10-CM | POA: Diagnosis present

## 2022-05-17 DIAGNOSIS — Z8249 Family history of ischemic heart disease and other diseases of the circulatory system: Secondary | ICD-10-CM | POA: Diagnosis not present

## 2022-05-17 DIAGNOSIS — Z79891 Long term (current) use of opiate analgesic: Secondary | ICD-10-CM | POA: Diagnosis not present

## 2022-05-17 DIAGNOSIS — Z7141 Alcohol abuse counseling and surveillance of alcoholic: Secondary | ICD-10-CM

## 2022-05-17 DIAGNOSIS — E785 Hyperlipidemia, unspecified: Secondary | ICD-10-CM | POA: Diagnosis present

## 2022-05-17 DIAGNOSIS — K219 Gastro-esophageal reflux disease without esophagitis: Secondary | ICD-10-CM | POA: Diagnosis present

## 2022-05-17 DIAGNOSIS — F10220 Alcohol dependence with intoxication, uncomplicated: Secondary | ICD-10-CM | POA: Diagnosis present

## 2022-05-17 DIAGNOSIS — Z8261 Family history of arthritis: Secondary | ICD-10-CM | POA: Diagnosis not present

## 2022-05-17 DIAGNOSIS — Z886 Allergy status to analgesic agent status: Secondary | ICD-10-CM

## 2022-05-17 DIAGNOSIS — F1010 Alcohol abuse, uncomplicated: Secondary | ICD-10-CM | POA: Diagnosis not present

## 2022-05-17 DIAGNOSIS — Z66 Do not resuscitate: Secondary | ICD-10-CM | POA: Diagnosis not present

## 2022-05-17 DIAGNOSIS — F17210 Nicotine dependence, cigarettes, uncomplicated: Secondary | ICD-10-CM | POA: Diagnosis present

## 2022-05-17 DIAGNOSIS — Z79899 Other long term (current) drug therapy: Secondary | ICD-10-CM

## 2022-05-17 DIAGNOSIS — R519 Headache, unspecified: Secondary | ICD-10-CM | POA: Diagnosis not present

## 2022-05-17 DIAGNOSIS — Z634 Disappearance and death of family member: Secondary | ICD-10-CM

## 2022-05-17 DIAGNOSIS — F10239 Alcohol dependence with withdrawal, unspecified: Secondary | ICD-10-CM | POA: Diagnosis not present

## 2022-05-17 DIAGNOSIS — E669 Obesity, unspecified: Secondary | ICD-10-CM | POA: Diagnosis present

## 2022-05-17 DIAGNOSIS — R109 Unspecified abdominal pain: Secondary | ICD-10-CM | POA: Diagnosis not present

## 2022-05-17 DIAGNOSIS — Z6833 Body mass index (BMI) 33.0-33.9, adult: Secondary | ICD-10-CM | POA: Diagnosis not present

## 2022-05-17 DIAGNOSIS — R55 Syncope and collapse: Secondary | ICD-10-CM | POA: Diagnosis present

## 2022-05-17 DIAGNOSIS — Z8619 Personal history of other infectious and parasitic diseases: Secondary | ICD-10-CM

## 2022-05-17 DIAGNOSIS — K760 Fatty (change of) liver, not elsewhere classified: Secondary | ICD-10-CM | POA: Diagnosis not present

## 2022-05-17 DIAGNOSIS — K625 Hemorrhage of anus and rectum: Secondary | ICD-10-CM | POA: Diagnosis present

## 2022-05-17 DIAGNOSIS — Z720 Tobacco use: Secondary | ICD-10-CM | POA: Diagnosis present

## 2022-05-17 DIAGNOSIS — F419 Anxiety disorder, unspecified: Secondary | ICD-10-CM | POA: Diagnosis present

## 2022-05-17 DIAGNOSIS — K7030 Alcoholic cirrhosis of liver without ascites: Secondary | ICD-10-CM | POA: Diagnosis not present

## 2022-05-17 DIAGNOSIS — K7460 Unspecified cirrhosis of liver: Secondary | ICD-10-CM

## 2022-05-17 DIAGNOSIS — B1920 Unspecified viral hepatitis C without hepatic coma: Secondary | ICD-10-CM

## 2022-05-17 DIAGNOSIS — F10920 Alcohol use, unspecified with intoxication, uncomplicated: Secondary | ICD-10-CM | POA: Insufficient documentation

## 2022-05-17 DIAGNOSIS — F32A Depression, unspecified: Secondary | ICD-10-CM | POA: Diagnosis not present

## 2022-05-17 DIAGNOSIS — E876 Hypokalemia: Secondary | ICD-10-CM | POA: Diagnosis present

## 2022-05-17 DIAGNOSIS — K922 Gastrointestinal hemorrhage, unspecified: Secondary | ICD-10-CM | POA: Diagnosis present

## 2022-05-17 DIAGNOSIS — I10 Essential (primary) hypertension: Secondary | ICD-10-CM | POA: Diagnosis present

## 2022-05-17 DIAGNOSIS — Z716 Tobacco abuse counseling: Secondary | ICD-10-CM

## 2022-05-17 DIAGNOSIS — K602 Anal fissure, unspecified: Secondary | ICD-10-CM | POA: Diagnosis not present

## 2022-05-17 LAB — COMPREHENSIVE METABOLIC PANEL
ALT: 99 U/L — ABNORMAL HIGH (ref 0–44)
AST: 111 U/L — ABNORMAL HIGH (ref 15–41)
Albumin: 4.6 g/dL (ref 3.5–5.0)
Alkaline Phosphatase: 110 U/L (ref 38–126)
Anion gap: 8 (ref 5–15)
BUN: 16 mg/dL (ref 8–23)
CO2: 18 mmol/L — ABNORMAL LOW (ref 22–32)
Calcium: 9.1 mg/dL (ref 8.9–10.3)
Chloride: 116 mmol/L — ABNORMAL HIGH (ref 98–111)
Creatinine, Ser: 0.69 mg/dL (ref 0.44–1.00)
GFR, Estimated: >60 mL/min (ref >60)
Glucose, Bld: 112 mg/dL — ABNORMAL HIGH (ref 70–99)
Potassium: 3.5 mmol/L (ref 3.5–5.1)
Sodium: 142 mmol/L (ref 135–145)
Total Bilirubin: 0.8 mg/dL (ref 0.3–1.2)
Total Protein: 8.5 g/dL — ABNORMAL HIGH (ref 6.5–8.1)

## 2022-05-17 LAB — URINALYSIS, ROUTINE W REFLEX MICROSCOPIC
Bacteria, UA: NONE SEEN
Bilirubin Urine: NEGATIVE
Glucose, UA: NEGATIVE mg/dL
Ketones, ur: NEGATIVE mg/dL
Leukocytes,Ua: NEGATIVE
Nitrite: NEGATIVE
Protein, ur: >=300 mg/dL — AB
Specific Gravity, Urine: >1.046 — ABNORMAL HIGH (ref 1.005–1.030)
pH: 5 (ref 5.0–8.0)

## 2022-05-17 LAB — AMMONIA: Ammonia: 28 umol/L (ref 9–35)

## 2022-05-17 LAB — RAPID URINE DRUG SCREEN, HOSP PERFORMED
Amphetamines: NOT DETECTED
Barbiturates: NOT DETECTED
Benzodiazepines: POSITIVE — AB
Cocaine: NOT DETECTED
Opiates: NOT DETECTED
Tetrahydrocannabinol: NOT DETECTED

## 2022-05-17 LAB — TYPE AND SCREEN
ABO/RH(D): A POS
Antibody Screen: NEGATIVE

## 2022-05-17 LAB — CBC
HCT: 45.9 % (ref 36.0–46.0)
Hemoglobin: 15.6 g/dL — ABNORMAL HIGH (ref 12.0–15.0)
MCH: 31.2 pg (ref 26.0–34.0)
MCHC: 34 g/dL (ref 30.0–36.0)
MCV: 91.8 fL (ref 80.0–100.0)
Platelets: 213 10*3/uL (ref 150–400)
RBC: 5 MIL/uL (ref 3.87–5.11)
RDW: 14.8 % (ref 11.5–15.5)
WBC: 7.4 10*3/uL (ref 4.0–10.5)
nRBC: 0 % (ref 0.0–0.2)

## 2022-05-17 LAB — HEMOGLOBIN AND HEMATOCRIT, BLOOD
HCT: 40 % (ref 36.0–46.0)
Hemoglobin: 13.4 g/dL (ref 12.0–15.0)

## 2022-05-17 LAB — PROTIME-INR
INR: 1 (ref 0.8–1.2)
Prothrombin Time: 13.3 seconds (ref 11.4–15.2)

## 2022-05-17 LAB — MAGNESIUM: Magnesium: 2 mg/dL (ref 1.7–2.4)

## 2022-05-17 LAB — ETHANOL: Alcohol, Ethyl (B): 306 mg/dL (ref <10)

## 2022-05-17 LAB — POC OCCULT BLOOD, ED: Fecal Occult Bld: POSITIVE — AB

## 2022-05-17 LAB — ABO/RH: ABO/RH(D): A POS

## 2022-05-17 LAB — PHOSPHORUS: Phosphorus: 2.7 mg/dL (ref 2.5–4.6)

## 2022-05-17 MED ORDER — LACTATED RINGERS IV SOLN
INTRAVENOUS | Status: DC
Start: 2022-05-17 — End: 2022-05-19

## 2022-05-17 MED ORDER — THIAMINE HCL 100 MG PO TABS
100.0000 mg | ORAL_TABLET | Freq: Every day | ORAL | Status: DC
  Administered 2022-05-17 – 2022-05-18 (×2): 100 mg via ORAL
  Filled 2022-05-17 (×3): qty 1

## 2022-05-17 MED ORDER — FOLIC ACID 1 MG PO TABS
1.0000 mg | ORAL_TABLET | Freq: Every day | ORAL | Status: DC
  Administered 2022-05-17 – 2022-05-19 (×3): 1 mg via ORAL
  Filled 2022-05-17 (×3): qty 1

## 2022-05-17 MED ORDER — SERTRALINE HCL 50 MG PO TABS
50.0000 mg | ORAL_TABLET | Freq: Every day | ORAL | Status: DC
  Administered 2022-05-17 – 2022-05-19 (×3): 50 mg via ORAL
  Filled 2022-05-17 (×3): qty 1

## 2022-05-17 MED ORDER — PANTOPRAZOLE SODIUM 40 MG PO TBEC
40.0000 mg | DELAYED_RELEASE_TABLET | Freq: Two times a day (BID) | ORAL | Status: DC
  Administered 2022-05-17 – 2022-05-19 (×4): 40 mg via ORAL
  Filled 2022-05-17 (×4): qty 1

## 2022-05-17 MED ORDER — GABAPENTIN 300 MG PO CAPS
300.0000 mg | ORAL_CAPSULE | Freq: Every day | ORAL | Status: DC | PRN
  Administered 2022-05-17: 300 mg via ORAL
  Filled 2022-05-17: qty 1

## 2022-05-17 MED ORDER — NICOTINE 14 MG/24HR TD PT24
14.0000 mg | MEDICATED_PATCH | Freq: Every day | TRANSDERMAL | Status: DC
  Administered 2022-05-19: 14 mg via TRANSDERMAL
  Filled 2022-05-17 (×2): qty 1

## 2022-05-17 MED ORDER — LORAZEPAM 1 MG PO TABS
1.0000 mg | ORAL_TABLET | ORAL | Status: DC | PRN
  Administered 2022-05-17 (×3): 1 mg via ORAL
  Administered 2022-05-18: 2 mg via ORAL
  Administered 2022-05-18 (×2): 1 mg via ORAL
  Administered 2022-05-18 – 2022-05-19 (×5): 2 mg via ORAL
  Filled 2022-05-17: qty 1
  Filled 2022-05-17 (×2): qty 2
  Filled 2022-05-17 (×2): qty 1
  Filled 2022-05-17 (×2): qty 2
  Filled 2022-05-17 (×2): qty 1
  Filled 2022-05-17: qty 2
  Filled 2022-05-17 (×2): qty 1

## 2022-05-17 MED ORDER — IOHEXOL 300 MG/ML  SOLN
100.0000 mL | Freq: Once | INTRAMUSCULAR | Status: AC | PRN
  Administered 2022-05-17: 100 mL via INTRAVENOUS

## 2022-05-17 MED ORDER — AMLODIPINE BESYLATE 10 MG PO TABS
10.0000 mg | ORAL_TABLET | Freq: Every day | ORAL | Status: DC
  Administered 2022-05-17 – 2022-05-19 (×3): 10 mg via ORAL
  Filled 2022-05-17: qty 2
  Filled 2022-05-17 (×2): qty 1

## 2022-05-17 MED ORDER — ADULT MULTIVITAMIN W/MINERALS CH
1.0000 | ORAL_TABLET | Freq: Every day | ORAL | Status: DC
  Administered 2022-05-17 – 2022-05-19 (×3): 1 via ORAL
  Filled 2022-05-17 (×3): qty 1

## 2022-05-17 MED ORDER — MORPHINE SULFATE (PF) 4 MG/ML IV SOLN
4.0000 mg | INTRAVENOUS | Status: DC | PRN
  Administered 2022-05-17: 4 mg via INTRAVENOUS
  Filled 2022-05-17: qty 1

## 2022-05-17 MED ORDER — OXYCODONE HCL 5 MG PO TABS
5.0000 mg | ORAL_TABLET | ORAL | Status: DC | PRN
  Administered 2022-05-17 – 2022-05-18 (×3): 5 mg via ORAL
  Filled 2022-05-17 (×3): qty 1

## 2022-05-17 MED ORDER — PROCHLORPERAZINE EDISYLATE 10 MG/2ML IJ SOLN
10.0000 mg | Freq: Four times a day (QID) | INTRAMUSCULAR | Status: DC | PRN
  Administered 2022-05-17 – 2022-05-19 (×4): 10 mg via INTRAVENOUS
  Filled 2022-05-17 (×4): qty 2

## 2022-05-17 MED ORDER — PANTOPRAZOLE SODIUM 40 MG IV SOLR
40.0000 mg | INTRAVENOUS | Status: DC
  Administered 2022-05-17: 40 mg via INTRAVENOUS
  Filled 2022-05-17: qty 10

## 2022-05-17 MED ORDER — LORAZEPAM 2 MG/ML IJ SOLN
1.0000 mg | INTRAMUSCULAR | Status: DC | PRN
  Administered 2022-05-17: 1 mg via INTRAVENOUS
  Filled 2022-05-17: qty 1

## 2022-05-17 MED ORDER — THIAMINE HCL 100 MG/ML IJ SOLN
100.0000 mg | Freq: Every day | INTRAMUSCULAR | Status: DC
  Administered 2022-05-19: 100 mg via INTRAVENOUS
  Filled 2022-05-17: qty 2

## 2022-05-17 MED ORDER — LORAZEPAM 2 MG/ML IJ SOLN
INTRAMUSCULAR | Status: AC
  Filled 2022-05-17: qty 1

## 2022-05-17 NOTE — ED Notes (Signed)
Called into room. Pt with seizure-like activity with rigid muscles. Not responsive to pain.

## 2022-05-17 NOTE — ED Provider Notes (Signed)
[facility] DEPT  Provider Note  CSN: [id number] Arrival date & time: 05/17/22 0200  History Chief Complaint  Patient presents with   Abdominal Pain    Latoya Swanson is a 71 y.o. female with history of EtOH use and cirrhosis, brought to the ED by a friend for evaluation of rectal bleeding and abdominal pain. She has been drinking more heavily since her husband died last week. While in Triage awaiting an exam room, she went to the bathroom and unable to urinate but when she got back to the room her friend report she started leaning forward and fell out of the chair. She was helped into a wheelchair and the got stiff. I initially saw her as she was being wheeled to the exam room, she had some myoclonus but no tonic-clonic activity and she was able to make eye contact and answer some questions. Friend at bedside also reports she has 'cancer' but unsure what kind only that it is 'in her stomach'. I was unable to find a cancer diagnosis in Epic   Home Medications Prior to Admission medications   Medication Sig Start Date End Date Taking? Authorizing Provider  amLODipine (NORVASC) 10 MG tablet TAKE 1 TABLET BY MOUTH EVERY DAY Patient taking differently: Take 10 mg by mouth daily. 04/08/22  Yes Martyn Malay, MD  atorvastatin (LIPITOR) 40 MG tablet Take 1 tablet (40 mg total) by mouth daily. 11/19/21  Yes [name], MD  Cholecalciferol (VITAMIN D-3) 1000 UNITS CAPS Take 1 capsule (1,000 Units total) by mouth daily. 02/24/13  Yes Chambliss, Jeb Levering, MD  gabapentin (NEURONTIN) 300 MG capsule Take 300 mg by mouth daily as needed (for nerve pain). 01/13/22  Yes Chambliss, Jeb Levering, MD  hydrOXYzine (ATARAX) 25 MG tablet TAKE ONE UP TO THREE TIMES A DAY AS NEEDED ANXIETY Patient taking differently: Take 25 mg by mouth 3 (three) times daily as needed for anxiety. 04/29/22  Yes [name], MD  omeprazole (PRILOSEC) 20 MG capsule Take 1 capsule (20 mg  total) by mouth 2 (two) times daily before a meal for 14 days. Patient taking differently: Take 20 mg by mouth daily as needed (for acid reflux). 08/22/21 05/18/23 Yes Danis, Kirke Corin, MD  sertraline (ZOLOFT) 25 MG tablet TAKE 2 TABLETS BY MOUTH EVERY DAY Patient taking differently: Take 50 mg by mouth daily. 03/14/22  Yes [name], MD  traZODone (DESYREL) 50 MG tablet Take 1/2 to 2 tabs at bedtime for sleep Patient taking differently: Take 75-100 mg by mouth at bedtime as needed for sleep. 04/29/22  Yes Chambliss, Jeb Levering, MD  triamterene-hydrochlorothiazide (OHYWVPX-10) 37.5-25 MG tablet TAKE 1 TABLET BY MOUTH EVERY DAY Patient taking differently: Take 1 tablet by mouth daily. 12/10/21  Yes Chambliss, Jeb Levering, MD  vitamin B-12 (CYANOCOBALAMIN) 500 MCG tablet Take 500 mcg by mouth daily.   Yes [provider]  VITAMIN E PO Take 1 capsule by mouth daily.   Yes [provider]     Allergies    Aspirin   Review of Systems   Review of Systems Please see HPI for pertinent positives and negatives  Physical Exam BP (!) 158/110   Pulse 76   Temp 97.9 F (36.6 C)   Resp 16   Ht '5\' 3"'$  (1.6 m)   Wt 84.8 kg   LMP 12/01/2001   SpO2 98%   BMI 33.13 kg/m   Physical Exam Vitals and nursing note reviewed.  Constitutional:      Appearance: Normal appearance. She is obese.  HENT:     Head: Normocephalic and atraumatic.     Nose: Nose normal.     Mouth/Throat:     Mouth: Mucous membranes are moist.  Eyes:     Extraocular Movements: Extraocular movements intact.     Conjunctiva/sclera: Conjunctivae normal.  Cardiovascular:     Rate and Rhythm: Normal rate.  Pulmonary:     Effort: Pulmonary effort is normal.     Breath sounds: Normal breath sounds.  Abdominal:     General: There is distension.     Tenderness: There is generalized abdominal tenderness. There is guarding.  Musculoskeletal:        General: No swelling. Normal range of motion.      Cervical back: Neck supple.  Skin:    General: Skin is warm and dry.  Neurological:     Mental Status: She is disoriented.     Comments: Moves all extremities, normal pupils  Psychiatric:        Mood and Affect: Mood normal.     ED Results / Procedures / Treatments   EKG None  Procedures Procedures  Medications Ordered in the ED Medications  iohexol (OMNIPAQUE) 300 MG/ML solution 100 mL (100 mLs Intravenous Contrast Given 05/17/22 0454)    Initial Impression and Plan  Patient initially here for evaluation of abdominal pain, rectal bleeding had an episode of unresponsiveness and fell out of her chair in triage. I did not see any convincing seizure activity and labs from triage showed an elevated EtOH. Will hold off on giving Ativan for now. Other labs in triage show CBC without anemia, BMP with elevated liver enzymes. INR is normal. Will add ammonia, UA, UDS and send for CT of head and abdomen/pelvis.   ED Course   Clinical Course as of 05/17/22 1157  Sat May 17, 2022  0431 Ammonia is not elevated [CS]  0554 I personally viewed the images from radiology studies and agree with radiologist interpretation: CT head and abd/pel neg for acute process. Will allow her to continue sobering in the ED.   [CS]  [phone] Patient has had a bright red bowel movement. With chaperone present, I examined her rectum but do not see any hemorrhoids or fissures. Will plan admission to Hospitalist, with GI consult. She is established with [facility]. Secure chat message sent to Dr. Havery Moros.  [CS]  0630 UDS positive for Benzos. Dr. Sidney Ace, Hospitalist, will accept for admission.  [CS]    Clinical Course User Index [CS] Truddie Hidden, MD     MDM Rules/Calculators/A&P Medical Decision Making Problems Addressed: Alcoholic cirrhosis of liver without ascites [facility]): chronic illness or injury Alcoholic intoxication without complication [facility]): chronic illness or injury with exacerbation, progression, or  side effects of treatment Rectal bleeding: acute illness or injury that poses a threat to life or bodily functions Syncope, unspecified syncope type: acute illness or injury that poses a threat to life or bodily functions  Amount and/or Complexity of Data Reviewed Labs: ordered. Decision-making details documented in ED Course. Radiology: ordered and independent interpretation performed. Decision-making details documented in ED Course.  Risk Prescription drug management. Decision regarding hospitalization.    Final Clinical Impression(s) / ED Diagnoses Final diagnoses:  Rectal bleeding  Alcoholic intoxication without complication (HCC)  Syncope, unspecified syncope type  Alcoholic cirrhosis of liver without ascites ([place])    Rx / DC Orders ED Discharge Orders     None  Truddie Hidden, MD 05/17/22 [phone]

## 2022-05-17 NOTE — H&P (Signed)
History and Physical    Patient: Latoya Swanson GMW:[id number] DOB: August 11, 1951 DOA: 05/17/2022 DOS: the patient was seen and examined on 05/17/2022 PCP: [name], MD  Patient coming from: Home  Chief Complaint:  Chief Complaint  Patient presents with   Abdominal Pain   HPI: Latoya Swanson is a 71 y.o. female with medical history significant of HTN, HLD, GERD, EtOH abuse, alcoholic cirrhosis, HTN, anxiety. Presenting with abdominal pain and rectal bleeding. She reports that she has been on a binge since her husband died Apr 24, 2023. She has been depressed and generally feeling bad. She denies SI/HI. She reports over the last several days, she had had gnawing epigastric pain. She has had N/V and poor appetite. She started seeing [facility] over the last 2 days. She didn't try any therapies for her symptoms. She states her last drink was 2 days ago, but she is unable to tell me how much she was drinking. When her symptoms persisted last night, she decided to come to the ED for assistance. While waiting in triage, she apparently had a syncopal episode. She is unsure of what happen but may have had seizure like activity. She denies any other aggravating or alleviating factors.    Review of Systems: As mentioned in the history of present illness. All other systems reviewed and are negative. 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   History of rectal fissure    Hypertension    Normal cardiac stress test 11/10/2006   [name]   Normal echocardiogram 08/19/2007   Past Surgical History:  Procedure Laterality Date   ANAL FISSURE REPAIR     CARPAL TUNNEL RELEASE Bilateral    DILATION AND CURETTAGE OF  UTERUS     ectopic pregnancy   ROTATOR CUFF REPAIR Right    x 2   TUBAL LIGATION     Social History:  reports that she has been smoking cigarettes. She has been smoking an average of .25 packs per day. She has never used smokeless tobacco. She reports that she does not currently use alcohol. She reports that she does not use drugs.  Allergies  Allergen Reactions   Aspirin     Causes stomach bleeds hx of ulcers    Family History  Problem Relation Age of Onset   Breast cancer Mother        died age 69   Colon cancer Mother    Arthritis Mother    Alzheimer's disease Mother    Coronary artery disease Father        died age 76   Stomach cancer Father    Arthritis Father    Stroke Sister    Heart failure Sister    Hypertension Sister    Arthritis Maternal Grandmother    Arthritis Paternal Grandmother    Breast cancer Paternal Grandmother    Alzheimer's disease Maternal Aunt        x5   Breast cancer Other        PGA    Prior to Admission medications   Medication Sig Start  Date End Date Taking? Authorizing Provider  amLODipine (NORVASC) 10 MG tablet TAKE 1 TABLET BY MOUTH EVERY DAY Patient taking differently: Take 10 mg by mouth daily. 04/08/22  Yes Martyn Malay, MD  atorvastatin (LIPITOR) 40 MG tablet Take 1 tablet (40 mg total) by mouth daily. 11/19/21  Yes [name], MD  Cholecalciferol (VITAMIN D-3) 1000 UNITS CAPS Take 1 capsule (1,000 Units total) by mouth daily. 02/24/13  Yes Chambliss, Jeb Levering, MD  gabapentin (NEURONTIN) 300 MG capsule Take 300 mg by mouth daily as needed (for nerve pain). 01/13/22  Yes Chambliss, Jeb Levering, MD  hydrOXYzine (ATARAX) 25 MG tablet TAKE ONE UP TO THREE TIMES A DAY AS NEEDED ANXIETY Patient taking differently: Take 25 mg by mouth 3 (three) times daily as needed for anxiety. 04/29/22  Yes [name], MD  omeprazole (PRILOSEC) 20 MG capsule Take 1 capsule (20 mg total) by mouth 2 (two) times daily before a meal for 14  days. Patient taking differently: Take 20 mg by mouth daily as needed (for acid reflux). 08/22/21 05/18/23 Yes Danis, Kirke Corin, MD  sertraline (ZOLOFT) 25 MG tablet TAKE 2 TABLETS BY MOUTH EVERY DAY Patient taking differently: Take 50 mg by mouth daily. 03/14/22  Yes [name], MD  traZODone (DESYREL) 50 MG tablet Take 1/2 to 2 tabs at bedtime for sleep Patient taking differently: Take 75-100 mg by mouth at bedtime as needed for sleep. 04/29/22  Yes Chambliss, Jeb Levering, MD  triamterene-hydrochlorothiazide (ZJIRCVE-93) 37.5-25 MG tablet TAKE 1 TABLET BY MOUTH EVERY DAY Patient taking differently: Take 1 tablet by mouth daily. 12/10/21  Yes Chambliss, Jeb Levering, MD  vitamin B-12 (CYANOCOBALAMIN) 500 MCG tablet Take 500 mcg by mouth daily.   Yes [provider]  VITAMIN E PO Take 1 capsule by mouth daily.   Yes [provider]    Physical Exam: Vitals:   05/17/22 8101 05/17/22 0645 05/17/22 0648 05/17/22 0652  BP: (!) 150/83  (!) 150/82 (!) 150/82  Pulse:  90 85 85  Resp: (!) '22 18 16   '$ Temp:      SpO2:  96% 95%   Weight:      Height:       General: 71 y.o. female resting in bed in NAD Eyes: PERRL, normal sclera ENMT: Nares patent w/o discharge, orophaynx clear, dentition normal, ears w/o discharge/lesions/ulcers Neck: Supple, trachea midline Cardiovascular: RRR, +S1, S2, no m/g/r, equal pulses throughout Respiratory: CTABL, no w/r/r, normal WOB GI: BS+, NDNT, no masses noted, no organomegaly noted MSK: No e/c/c Neuro: A&O x 3, no focal deficits Psyc: Appropriate interaction and affect, calm/cooperative  Data Reviewed:  Na+  142 K+ 3.5 Cl-  116 CO2  18 BUN  16 Scr  0.69 AST  111 ALT  99 Ammonia  28 WBC  7.4 Hgb  15.6 Plt  213  CTH:  1. No acute finding. 2. Chronic small vessel ischemia.  CT ab/pelvis 1. No acute finding. 2. Cirrhosis.  EKG: sinus, no st elevations  Assessment and Plan: GIB     - admitted to inpt, progressive      - q6h H&H; transfuse for Hgb < 7     - PPI     - [facility] consulted, appreciate assistance     - NPO except sips w/ meds, ice chips for now  Syncope     - orthostatics     - likely secondary to EtOH; hold on echo for now     -  no convincing seizure activity seen with EDP; none noted during my exam  EtOH abuse     - CIWA protocol     - thiamine, MVI, folate     - fluids     - counsel against further use  Tobacco abuse     - counsel against further use     - nicotine patch  Alcohol induced cirrhosis     - imaging as above     - resume home regimen  HLD     - hold statin d/t elevated LFTs  HTN     - resume home regimen  Anxiety Depression     - resume home regimen  GERD     - PPI  Advance Care Planning:   Code Status: DNR (confirmed w/ patient and consistent w/ previously documented wishes)  Consults: [facility]  Family Communication: None at bedside  Severity of Illness: The appropriate patient status for this patient is INPATIENT. Inpatient status is judged to be reasonable and necessary in order to provide the required intensity of service to ensure the patient's safety. The patient's presenting symptoms, physical exam findings, and initial radiographic and laboratory data in the context of their chronic comorbidities is felt to place them at high risk for further clinical deterioration. Furthermore, it is not anticipated that the patient will be medically stable for discharge from the hospital within 2 midnights of admission.   * I certify that at the point of admission it is my clinical judgment that the patient will require inpatient hospital care spanning beyond 2 midnights from the point of admission due to high intensity of service, high risk for further deterioration and high frequency of surveillance required.*  Author: Jonnie Finner, DO 05/17/2022 7:08 AM  For on call review [email or web address].

## 2022-05-17 NOTE — Progress Notes (Signed)
PHARMACIST - PHYSICIAN COMMUNICATION  DR:   Marylyn Ishihara  CONCERNING: IV to Oral Route Change Policy  RECOMMENDATION: This patient is receiving pantoprazole by the intravenous route.  Based on criteria approved by the Pharmacy and Therapeutics Committee, the intravenous medication(s) is/are being converted to the equivalent oral dose form(s).   DESCRIPTION: These criteria include: The patient is eating (either orally or via tube) and/or has been taking other orally administered medications for a least 24 hours The patient has no evidence of active gastrointestinal bleeding or impaired GI absorption (gastrectomy, short bowel, patient on TNA or NPO).  If you have questions about this conversion, please contact the Pharmacy Department  '[]'$   [phone] )  [facility] '[]'$   [phone] )  [facility] '[]'$   [phone] )  [facility] '[]'$   [phone] )  [facility] '[x]'$   [phone] )  [place], [facility] 05/17/2022 12:54 PM

## 2022-05-17 NOTE — ED Notes (Signed)
Pt walked to restroom with assistance

## 2022-05-17 NOTE — Consult Note (Signed)
Consultation  Referring Provider:     Cherylann Ratel Primary Care Physician:  [name], MD Primary Gastroenterologist:        Dr. Loletha Carrow - [facility] Reason for Consultation:     rectal bleeding, abdominal pain, cirrhosis         HPI:   Latoya Swanson is a 71 y.o. female with a history of cirrhosis secondary to hepatitis C, also with history of alcohol abuse, anal fissures, colon polyps, H. pylori gastritis, presented to the hospital with rectal bleeding and abdominal pain.  Our service was consulted for assistance in management.  The patient has a history of hepatitis C status posttreatment with infectious disease a few years ago with eradication.  She had a history of compensated cirrhosis historically.  EGD in October 2022 showed no esophageal varices, she did have H. pylori gastritis and was treated for that but did not have follow-up H. pylori eradication testing.  She also had a colonoscopy at that time showing an advanced adenoma.  Internal hemorrhoids noted as well upon review of images.  She was last seen by Dr. Loletha Carrow in October 2022.  She states her husband died on Apr 27, 2023.  Since that time she has been drinking a lot of alcohol to help manage her emotions.  She states her stomach started feeling poorly when she drank.  She localizes this to her mid to epigastric area.  She has had poor appetite and some nausea and vomiting.  No blood in the emesis.  She states historically she has a history of anal fissure.  She had some straining with her bowels in recent weeks although in recent days has not had this.  She denies any overt rectal pain but has had some bright red blood per rectum in the past 2 days, a few episodes. She was noted to be intoxicated in the emergency room.  AST of 111, ALT 99, T. bili 0.8, alk phos normal.  Hemoglobin 15.6, platelets 213, WBC 7.4.  Ammonia level of 28. INR 1.0.  Talk screen positive for benzodiazepines.  She states her stools have been brown but rectal  bleeding has been bright red blood when she sees it.  She has not had any episodes since has been in the hospital.  She and she had fluids and resting in the hospital, off alcohol, she is feeling improved this morning.  She wants to eat.  She denies using any NSAIDs recently.  CT scan done in the ED shows no acute findings.  Apparently in the waiting room last night she had a questionable syncopal episode versus seizure-like activity.  None noted since that time.   Past Medical History:  Diagnosis Date   Alcoholism ([place])    Anxiety    Arthritis    Cirrhosis ([place])    Colon polyps    Colon polyps    Depression    GERD (gastroesophageal reflux disease)    Hepatitis C    treated and cured   History of bleeding peptic ulcer    History of MRI of spine    Right foraminal stenosis at C2-3 due to spurring.   History of rectal fissure    Hypertension    Normal cardiac stress test 11/10/2006   [name]   Normal echocardiogram 08/19/2007    Past Surgical History:  Procedure Laterality Date   ANAL FISSURE REPAIR     CARPAL TUNNEL RELEASE Bilateral    DILATION AND CURETTAGE OF UTERUS  ectopic pregnancy   ROTATOR CUFF REPAIR Right    x 2   TUBAL LIGATION      Family History  Problem Relation Age of Onset   Breast cancer Mother        died age 4   Colon cancer Mother    Arthritis Mother    Alzheimer's disease Mother    Coronary artery disease Father        died age 24   Stomach cancer Father    Arthritis Father    Stroke Sister    Heart failure Sister    Hypertension Sister    Arthritis Maternal Grandmother    Arthritis Paternal Grandmother    Breast cancer Paternal Grandmother    Alzheimer's disease Maternal Aunt        x5   Breast cancer Other        PGA     Social History   Tobacco Use   Smoking status: Every Day    Packs/day: 0.25    Types: Cigarettes    Last attempt to quit: 02/20/2013    Years since quitting: 9.2   Smokeless tobacco: Never   Tobacco  comments:    2-3 cigs a day  Vaping Use   Vaping Use: Never used  Substance Use Topics   Alcohol use: Not Currently    Comment: glass of wine occasionally   Drug use: No    Prior to Admission medications   Medication Sig Start Date End Date Taking? Authorizing Provider  amLODipine (NORVASC) 10 MG tablet TAKE 1 TABLET BY MOUTH EVERY DAY Patient taking differently: Take 10 mg by mouth daily. 04/08/22  Yes Martyn Malay, MD  atorvastatin (LIPITOR) 40 MG tablet Take 1 tablet (40 mg total) by mouth daily. 11/19/21  Yes [name], MD  Cholecalciferol (VITAMIN D-3) 1000 UNITS CAPS Take 1 capsule (1,000 Units total) by mouth daily. 02/24/13  Yes Chambliss, Jeb Levering, MD  gabapentin (NEURONTIN) 300 MG capsule Take 300 mg by mouth daily as needed (for nerve pain). 01/13/22  Yes Chambliss, Jeb Levering, MD  hydrOXYzine (ATARAX) 25 MG tablet TAKE ONE UP TO THREE TIMES A DAY AS NEEDED ANXIETY Patient taking differently: Take 25 mg by mouth 3 (three) times daily as needed for anxiety. 04/29/22  Yes [name], MD  omeprazole (PRILOSEC) 20 MG capsule Take 1 capsule (20 mg total) by mouth 2 (two) times daily before a meal for 14 days. Patient taking differently: Take 20 mg by mouth daily as needed (for acid reflux). 08/22/21 05/18/23 Yes Danis, Kirke Corin, MD  sertraline (ZOLOFT) 25 MG tablet TAKE 2 TABLETS BY MOUTH EVERY DAY Patient taking differently: Take 50 mg by mouth daily. 03/14/22  Yes [name], MD  traZODone (DESYREL) 50 MG tablet Take 1/2 to 2 tabs at bedtime for sleep Patient taking differently: Take 75-100 mg by mouth at bedtime as needed for sleep. 04/29/22  Yes Chambliss, Jeb Levering, MD  triamterene-hydrochlorothiazide (EUMPNTI-14) 37.5-25 MG tablet TAKE 1 TABLET BY MOUTH EVERY DAY Patient taking differently: Take 1 tablet by mouth daily. 12/10/21  Yes Chambliss, Jeb Levering, MD  vitamin B-12 (CYANOCOBALAMIN) 500 MCG tablet Take 500 mcg by mouth daily.   Yes  [provider]  VITAMIN E PO Take 1 capsule by mouth daily.   Yes [provider]    Current Facility-Administered Medications  Medication Dose Route Frequency Provider Last Rate Last Admin   amLODipine (NORVASC) tablet 10 mg  10 mg Oral  Daily Marylyn Ishihara, Tyrone A, DO   10 mg at 02/54/27 0623   folic acid (FOLVITE) tablet 1 mg  1 mg Oral Daily Marylyn Ishihara, Tyrone A, DO   1 mg at 05/17/22 0950   gabapentin (NEURONTIN) capsule 300 mg  300 mg Oral Daily PRN Cherylann Ratel A, DO   300 mg at 05/17/22 1003   lactated ringers infusion   Intravenous Continuous Kyle, Tyrone A, DO 75 mL/hr at 05/17/22 1009 New Bag at 05/17/22 1009   LORazepam (ATIVAN) tablet 1-4 mg  1-4 mg Oral Q1H PRN Cherylann Ratel A, DO   1 mg at 05/17/22 7628   Or   LORazepam (ATIVAN) injection 1-4 mg  1-4 mg Intravenous Q1H PRN Marylyn Ishihara, Tyrone A, DO       morphine (PF) 4 MG/ML injection 4 mg  4 mg Intravenous Q2H PRN Marylyn Ishihara, Tyrone A, DO       multivitamin with minerals tablet 1 tablet  1 tablet Oral Daily Kyle, Tyrone A, DO   1 tablet at 05/17/22 0950   nicotine (NICODERM CQ - dosed in mg/24 hours) patch 14 mg  14 mg Transdermal Daily Kyle, Tyrone A, DO       oxyCODONE (Oxy IR/ROXICODONE) immediate release tablet 5 mg  5 mg Oral Q4H PRN Marylyn Ishihara, Tyrone A, DO       pantoprazole (PROTONIX) injection 40 mg  40 mg Intravenous Q24H Kyle, Tyrone A, DO   40 mg at 05/17/22 1003   prochlorperazine (COMPAZINE) injection 10 mg  10 mg Intravenous Q6H PRN Marylyn Ishihara, Tyrone A, DO   10 mg at 05/17/22 0951   sertraline (ZOLOFT) tablet 50 mg  50 mg Oral Daily Kyle, Tyrone A, DO   50 mg at 05/17/22 1003   thiamine tablet 100 mg  100 mg Oral Daily Kyle, Tyrone A, DO   100 mg at 05/17/22 3151   Or   thiamine (B-1) injection 100 mg  100 mg Intravenous Daily Marylyn Ishihara, Tyrone A, DO       Current Outpatient Medications  Medication Sig Dispense Refill   amLODipine (NORVASC) 10 MG tablet TAKE 1 TABLET BY MOUTH EVERY DAY (Patient taking differently: Take 10 mg by mouth  daily.) 90 tablet 2   atorvastatin (LIPITOR) 40 MG tablet Take 1 tablet (40 mg total) by mouth daily. 90 tablet 3   Cholecalciferol (VITAMIN D-3) 1000 UNITS CAPS Take 1 capsule (1,000 Units total) by mouth daily. 30 capsule 6   gabapentin (NEURONTIN) 300 MG capsule Take 300 mg by mouth daily as needed (for nerve pain). 270 capsule 0   hydrOXYzine (ATARAX) 25 MG tablet TAKE ONE UP TO THREE TIMES A DAY AS NEEDED ANXIETY (Patient taking differently: Take 25 mg by mouth 3 (three) times daily as needed for anxiety.) 60 tablet 1   omeprazole (PRILOSEC) 20 MG capsule Take 1 capsule (20 mg total) by mouth 2 (two) times daily before a meal for 14 days. (Patient taking differently: Take 20 mg by mouth daily as needed (for acid reflux).) 28 capsule 0   sertraline (ZOLOFT) 25 MG tablet TAKE 2 TABLETS BY MOUTH EVERY DAY (Patient taking differently: Take 50 mg by mouth daily.) 180 tablet 1   traZODone (DESYREL) 50 MG tablet Take 1/2 to 2 tabs at bedtime for sleep (Patient taking differently: Take 75-100 mg by mouth at bedtime as needed for sleep.) 60 tablet 1   triamterene-hydrochlorothiazide (MAXZIDE-25) 37.5-25 MG tablet TAKE 1 TABLET BY MOUTH EVERY DAY (Patient taking differently: Take 1 tablet by mouth  daily.) 90 tablet 1   vitamin B-12 (CYANOCOBALAMIN) 500 MCG tablet Take 500 mcg by mouth daily.     VITAMIN E PO Take 1 capsule by mouth daily.      Allergies as of 05/17/2022 - Review Complete 05/17/2022  Allergen Reaction Noted   Aspirin  02/23/2013     Review of Systems:    As per HPI, otherwise negative    Physical Exam:  Vital signs in last 24 hours: Temp:  [97.9 F (36.6 C)] 97.9 F (36.6 C) (07/08 0208) Pulse Rate:  [76-90] 77 (07/08 1010) Resp:  [13-23] 13 (07/08 1010) BP: (136-186)/(73-157) 152/85 (07/08 1010) SpO2:  [95 %-98 %] 97 % (07/08 1010) Weight:  [84.8 kg] 84.8 kg (07/08 0209)   General:   Pleasant female in NAD Head:  Normocephalic and atraumatic. Eyes:   No icterus.    Conjunctiva pink. Ears:  Normal auditory acuity. Neck:  Supple Lungs:  Respirations even and unlabored. Lungs clear to auscultation bilaterally.  Heart:  Regular rate and rhythm;  Abdomen:  Soft, nondistended, mild mid to epigastric TTP.  Rectal:  performed with ED nurse as standby -  posterior midline anal fissure tender to palpation, no mass lesions, hemorrhoids noted, brown stool in vault Msk:  Symmetrical without gross deformities.  Extremities:  Without edema. Neurologic:  Alert and  oriented x4;  grossly normal neurologically. Psych:  Alert and cooperative. Normal affect.  LAB RESULTS: Recent Labs    05/17/22 0217  WBC 7.4  HGB 15.6*  HCT 45.9  PLT 213   BMET Recent Labs    05/17/22 0217  NA 142  K 3.5  CL 116*  CO2 18*  GLUCOSE 112*  BUN 16  CREATININE 0.69  CALCIUM 9.1   LFT Recent Labs    05/17/22 0217  PROT 8.5*  ALBUMIN 4.6  AST 111*  ALT 99*  ALKPHOS 110  BILITOT 0.8   PT/INR Recent Labs    05/17/22 0217  LABPROT 13.3  INR 1.0    STUDIES: CT Abdomen Pelvis W Contrast  Result Date: 05/17/2022 CLINICAL DATA:  Abdominal pain.  History of cirrhosis EXAM: CT ABDOMEN AND PELVIS WITH CONTRAST TECHNIQUE: Multidetector CT imaging of the abdomen and pelvis was performed using the standard protocol following bolus administration of intravenous contrast. RADIATION DOSE REDUCTION: This exam was performed according to the departmental dose-optimization program which includes automated exposure control, adjustment of the mA and/or kV according to patient size and/or use of iterative reconstruction technique. CONTRAST:  187m OMNIPAQUE IOHEXOL 300 MG/ML  SOLN COMPARISON:  04/30/2012 FINDINGS: Lower chest:  No contributory findings. Hepatobiliary: Hepatic steatosis. Cirrhotic liver lobulation. No focal mass is seen.No evidence of biliary obstruction or stone. Pancreas: Unremarkable. Spleen: Unremarkable. Adrenals/Urinary Tract: Negative adrenals. No hydronephrosis or  stone. Unremarkable bladder. Stomach/Bowel:  No obstruction. No appendicitis. Vascular/Lymphatic: No acute vascular abnormality. Atheromatous calcification of the aorta and branch vessels. No mass or adenopathy. Reproductive:Uterine atrophy around partially calcified fibroids. Other: No ascites or pneumoperitoneum. Musculoskeletal: No acute abnormalities. Generalized lumbar spine degeneration with mild L3-4 anterolisthesis. L3-4 spinal stenosis. IMPRESSION: 1. No acute finding. 2. Cirrhosis. Electronically Signed   By: JJorje GuildM.D.   On: 05/17/2022 05:38   CT Head Wo Contrast  Result Date: 05/17/2022 CLINICAL DATA:  Head trauma and abdominal pain EXAM: CT HEAD WITHOUT CONTRAST TECHNIQUE: Contiguous axial images were obtained from the base of the skull through the vertex without intravenous contrast. RADIATION DOSE REDUCTION: This exam was performed according to the departmental  dose-optimization program which includes automated exposure control, adjustment of the mA and/or kV according to patient size and/or use of iterative reconstruction technique. COMPARISON:  None Available. FINDINGS: Brain: No evidence of acute infarction, hemorrhage, hydrocephalus, extra-axial collection or mass lesion/mass effect. Chronic small vessel ischemic change in the deep cerebral white matter. Brain volume is normal. Vascular: No hyperdense vessel or unexpected calcification. Skull: Normal. Negative for fracture or focal lesion. Sinuses/Orbits: No acute finding. IMPRESSION: 1. No acute finding. 2. Chronic small vessel ischemia. Electronically Signed   By: Jorje Guild M.D.   On: 05/17/2022 05:30       Impression / Plan:   71 year old female with history of hep C related cirrhosis and alcoholism, presenting to the hospital with abdominal discomfort, poor p.o. intake, rectal bleeding, while intoxicated.  She has been drinking a lot of alcohol lately coping with the death of her husband, think this is making her  feel poorly.  She does have a history of H. pylori gastritis, was treated for this but did not follow-up for eradication testing.  In regards to her rectal bleeding, I think this is very likely due to an anal fissure on exam today.  She has brown stool in the vault, tenderness with fissure in posterior midline.  She has had a history of these in the past.  She has a normal hemoglobin, normal INR, normal platelets, no varices on her last EGD, I think complications from her cirrhosis is quite unlikely at this point time.  Essentially I think she is drinking too much alcohol, perhaps has some gastritis from this, and rectal bleeding related to a fissure which is self-limited and mild.  She is not having a true GI bleed based on evaluation at this time. Her pain is improved as of today upon my evaluation.  She is hungry and wants to eat.  Hep C related cirrhosis - compensated Acute alcohol intoxication History of H pylori gastritis Rectal bleeding suspected to be secondary to anal fissure  Plan: - she can eat from my perspective, no planned procedures - alcohol abstinence - agree with protonix 35m PO BID - for fissure recommend topical lidocaine ointment applied to affected area for pain - when she is out of the hospital for this - would use 0.125% nitroglycerin ointment - pea sized amount applied TID for fissure. [facility] (it is not available in the hospital) - daily fiber supplement such as metamucil daily once out of the hospital, avoid straining - she can follow up with Dr. DLoletha Carrowin our office following her hospital stay for [place] term cirrhosis management, H pylori eradication testing, etc. We will coordinate follow up for her. - she does not need to stay in the hospital for scant rectal bleeding due to fissure. Disposition per primary team in regards to her mental status which I suspect was due to alcohol intoxication, she seems  improved and ammonia normal  We will sign off at this time, please call with questions.   SJolly Mango MD [facility]

## 2022-05-17 NOTE — ED Triage Notes (Signed)
Ambulatory to ED with c/o abd pain and rectal bleeding. Having decreased appetite and increased ETOH use since husband died last week.

## 2022-05-18 DIAGNOSIS — F10920 Alcohol use, unspecified with intoxication, uncomplicated: Secondary | ICD-10-CM

## 2022-05-18 DIAGNOSIS — K7030 Alcoholic cirrhosis of liver without ascites: Secondary | ICD-10-CM

## 2022-05-18 LAB — COMPREHENSIVE METABOLIC PANEL
ALT: 78 U/L — ABNORMAL HIGH (ref 0–44)
AST: 80 U/L — ABNORMAL HIGH (ref 15–41)
Albumin: 3.9 g/dL (ref 3.5–5.0)
Alkaline Phosphatase: 89 U/L (ref 38–126)
Anion gap: 8 (ref 5–15)
BUN: 18 mg/dL (ref 8–23)
CO2: 18 mmol/L — ABNORMAL LOW (ref 22–32)
Calcium: 8.7 mg/dL — ABNORMAL LOW (ref 8.9–10.3)
Chloride: 115 mmol/L — ABNORMAL HIGH (ref 98–111)
Creatinine, Ser: 0.76 mg/dL (ref 0.44–1.00)
GFR, Estimated: >60 mL/min (ref >60)
Glucose, Bld: 100 mg/dL — ABNORMAL HIGH (ref 70–99)
Potassium: 3.3 mmol/L — ABNORMAL LOW (ref 3.5–5.1)
Sodium: 141 mmol/L (ref 135–145)
Total Bilirubin: 1.6 mg/dL — ABNORMAL HIGH (ref 0.3–1.2)
Total Protein: 7.1 g/dL (ref 6.5–8.1)

## 2022-05-18 LAB — CBC
HCT: 39.5 % (ref 36.0–46.0)
Hemoglobin: 13 g/dL (ref 12.0–15.0)
MCH: 31.8 pg (ref 26.0–34.0)
MCHC: 32.9 g/dL (ref 30.0–36.0)
MCV: 96.6 fL (ref 80.0–100.0)
Platelets: 155 10*3/uL (ref 150–400)
RBC: 4.09 MIL/uL (ref 3.87–5.11)
RDW: 15.1 % (ref 11.5–15.5)
WBC: 8.2 10*3/uL (ref 4.0–10.5)
nRBC: 0 % (ref 0.0–0.2)

## 2022-05-18 LAB — HEMOGLOBIN AND HEMATOCRIT, BLOOD
HCT: 38 % (ref 36.0–46.0)
HCT: 36.6 % (ref 36.0–46.0)
Hemoglobin: 12.8 g/dL (ref 12.0–15.0)
Hemoglobin: 12.4 g/dL (ref 12.0–15.0)

## 2022-05-18 LAB — HIV ANTIBODY (ROUTINE TESTING W REFLEX): HIV Screen 4th Generation wRfx: NONREACTIVE

## 2022-05-18 NOTE — Progress Notes (Signed)
PROGRESS NOTE  Latoya Swanson SLH:[id number] DOB: [birth year] DOA: 05/17/2022 PCP: [name], MD   LOS: 1 day   Brief Narrative / Interim history: 71 year old female with history of HTN, HLD, EtOH abuse and alcoholic cirrhosis, HTN, anxiety who comes into the hospital with abdominal pain and rectal bleeding.  Her husband died 04/23/23 and she has been binge drinking since, but over the last day she has been feeling worse with abdominal complaints, nausea, vomiting, poor appetite.  Due to persistent symptoms she decided come to the ER  Subjective / 24h Interval events: Quite tremulous this morning, but overall feels a little bit better.  Less abdominal pain, no vomiting overnight  Assesement and Plan: Principal Problem:   GI bleeding Active Problems:   Depression   Essential hypertension   GERD   Tobacco use   Anxiety   Alcoholic cirrhosis (HCC)   Alcohol abuse   HLD (hyperlipidemia)   Syncope  Principal problem Alcohol abuse, withdrawal-significantly withdrawing this morning, is very tremulous.  She is determined to quit EtOH and agreeing to detox.  Continue CIWA, Ativan, still triggering.  Due to extensive drinking in the past month she is at risk for DTs, closely monitor.  TOC consult  Active problems Bright red blood per rectum-GI consulted and evaluated patient on 7/8.  It is believed based on physical exam that she may have an anal fissure.  Recommended topical lidocaine ointment, and use nitroglycerin ointment 3 times daily when discharged.  GI signed off.  Hemoglobin stable, no further bleeding  Syncope-due to EtOH use.  Alcohol induced liver cirrhosis, elevated LFTs-monitor, no clear-cut decompensation at this point.  Counseled for cessation  Hypokalemia-replete and continue to monitor  Tobacco use-nicotine patch  Essential hypertension-continue home regimen with amlodipine   Anxiety, Depression - resume home regimen   GERD- PPI   Scheduled Meds:   amLODipine  10 mg Oral Daily   folic acid  1 mg Oral Daily   multivitamin with minerals  1 tablet Oral Daily   nicotine  14 mg Transdermal Daily   pantoprazole  40 mg Oral BID   sertraline  50 mg Oral Daily   thiamine  100 mg Oral Daily   Or   thiamine  100 mg Intravenous Daily   Continuous Infusions:  lactated ringers 75 mL/hr at 05/17/22 2214   PRN Meds:.gabapentin, LORazepam **OR** LORazepam, morphine injection, oxyCODONE, prochlorperazine  Diet Orders (From admission, onward)     Start     Ordered   05/17/22 1120  Diet regular Room service appropriate? Yes; Fluid consistency: Thin  Diet effective now       Question Answer Comment  Room service appropriate? Yes   Fluid consistency: Thin      05/17/22 1119            DVT prophylaxis: SCDs Start: 05/17/22 0946   Lab Results  Component Value Date   PLT 155 05/18/2022      Code Status: DNR  Family Communication: no family at bedside  Status is: Inpatient  Remains inpatient appropriate because: ETOH withdrawals   Level of care: Progressive  Consultants:  GI  Objective: Vitals:   05/17/22 1548 05/17/22 1942 05/18/22 0007 05/18/22 0440  BP: (!) 155/91 (!) 163/88 (!) 159/101 (!) 161/102  Pulse: 89 94 87 85  Resp: '20 14 14 14  '$ Temp: 98.6 F (37 C) 98.4 F (36.9 C) 98.4 F (36.9 C) 98.2 F (36.8 C)  TempSrc: Oral Oral Oral Oral  SpO2:  98% 98% 99% 98%  Weight: 88.9 kg     Height: '5\' 3"'$  (1.6 m)       Intake/Output Summary (Last 24 hours) at 05/18/2022 1049 Last data filed at 05/18/2022 0300 Gross per 24 hour  Intake 748.33 ml  Output --  Net 748.33 ml   Wt Readings from Last 3 Encounters:  05/17/22 88.9 kg  04/29/22 89.6 kg  02/19/22 93.5 kg    Examination:  Constitutional: Visibly tremulous Eyes: no scleral icterus ENMT: Mucous membranes are moist.  Neck: normal, supple Respiratory: clear to auscultation bilaterally, no wheezing, no crackles.  Cardiovascular: Regular rate and rhythm, no  murmurs / rubs / gallops. No LE edema.  Abdomen: non distended, no tenderness. Bowel sounds positive.  Musculoskeletal: no clubbing / cyanosis.  Skin: no rashes Neurologic: Nonfocal   Data Reviewed: I have independently reviewed following labs and imaging studies   CBC Recent Labs  Lab 05/17/22 0217 05/17/22 1908 05/18/22 0040 05/18/22 0658  WBC 7.4  --  8.2  --   HGB 15.6* 13.4 13.0  12.8 12.4  HCT 45.9 40.0 39.5  38.0 36.6  PLT 213  --  155  --   MCV 91.8  --  96.6  --   MCH 31.2  --  31.8  --   MCHC 34.0  --  32.9  --   RDW 14.8  --  15.1  --     Recent Labs  Lab 05/17/22 0217 05/17/22 0402 05/17/22 1908 05/18/22 0040  NA 142  --   --  141  K 3.5  --   --  3.3*  CL 116*  --   --  115*  CO2 18*  --   --  18*  GLUCOSE 112*  --   --  100*  BUN 16  --   --  18  CREATININE 0.69  --   --  0.76  CALCIUM 9.1  --   --  8.7*  AST 111*  --   --  80*  ALT 99*  --   --  78*  ALKPHOS 110  --   --  89  BILITOT 0.8  --   --  1.6*  ALBUMIN 4.6  --   --  3.9  MG  --   --  2.0  --   INR 1.0  --   --   --   AMMONIA  --  28  --   --     ------------------------------------------------------------------------------------------------------------------ No results for input(s): "CHOL", "HDL", "LDLCALC", "TRIG", "CHOLHDL", "LDLDIRECT" in the last 72 hours.  Lab Results  Component Value Date   HGBA1C 4.9 04/15/2016   ------------------------------------------------------------------------------------------------------------------ No results for input(s): "TSH", "T4TOTAL", "T3FREE", "THYROIDAB" in the last 72 hours.  Invalid input(s): "FREET3"  Cardiac Enzymes No results for input(s): "CKMB", "TROPONINI", "MYOGLOBIN" in the last 168 hours.  Invalid input(s): "CK" ------------------------------------------------------------------------------------------------------------------ No results found for: "BNP"  CBG: No results for input(s): "GLUCAP" in the last 168  hours.  No results found for this or any previous visit (from the past 240 hour(s)).   Radiology Studies: No results found.   Marzetta Board, MD, PhD [facility]  Between 7 am - 7 pm I am available, please contact me via Amion (for emergencies) or Securechat (non urgent messages)  Between 7 pm - 7 am I am not available, please contact night coverage MD/APP via Amion

## 2022-05-19 ENCOUNTER — Telehealth: Payer: Self-pay

## 2022-05-19 DIAGNOSIS — K922 Gastrointestinal hemorrhage, unspecified: Secondary | ICD-10-CM | POA: Diagnosis not present

## 2022-05-19 LAB — COMPREHENSIVE METABOLIC PANEL
ALT: 72 U/L — ABNORMAL HIGH (ref 0–44)
AST: 86 U/L — ABNORMAL HIGH (ref 15–41)
Albumin: 3.9 g/dL (ref 3.5–5.0)
Alkaline Phosphatase: 82 U/L (ref 38–126)
Anion gap: 8 (ref 5–15)
BUN: 9 mg/dL (ref 8–23)
CO2: 21 mmol/L — ABNORMAL LOW (ref 22–32)
Calcium: 8.6 mg/dL — ABNORMAL LOW (ref 8.9–10.3)
Chloride: 110 mmol/L (ref 98–111)
Creatinine, Ser: 0.54 mg/dL (ref 0.44–1.00)
GFR, Estimated: >60 mL/min (ref >60)
Glucose, Bld: 106 mg/dL — ABNORMAL HIGH (ref 70–99)
Potassium: 2.9 mmol/L — ABNORMAL LOW (ref 3.5–5.1)
Sodium: 139 mmol/L (ref 135–145)
Total Bilirubin: 1.3 mg/dL — ABNORMAL HIGH (ref 0.3–1.2)
Total Protein: 6.9 g/dL (ref 6.5–8.1)

## 2022-05-19 LAB — CBC
HCT: 34.7 % — ABNORMAL LOW (ref 36.0–46.0)
Hemoglobin: 11.8 g/dL — ABNORMAL LOW (ref 12.0–15.0)
MCH: 31.6 pg (ref 26.0–34.0)
MCHC: 34 g/dL (ref 30.0–36.0)
MCV: 93 fL (ref 80.0–100.0)
Platelets: 137 10*3/uL — ABNORMAL LOW (ref 150–400)
RBC: 3.73 MIL/uL — ABNORMAL LOW (ref 3.87–5.11)
RDW: 14.3 % (ref 11.5–15.5)
WBC: 5.8 10*3/uL (ref 4.0–10.5)
nRBC: 0 % (ref 0.0–0.2)

## 2022-05-19 LAB — MAGNESIUM: Magnesium: 1.8 mg/dL (ref 1.7–2.4)

## 2022-05-19 LAB — POTASSIUM: Potassium: 3.4 mmol/L — ABNORMAL LOW (ref 3.5–5.1)

## 2022-05-19 MED ORDER — LORAZEPAM 0.5 MG PO TABS: 0.5000 mg | ORAL_TABLET | Freq: Three times a day (TID) | ORAL | 0 refills | Status: DC | PRN

## 2022-05-19 MED ORDER — NICOTINE 14 MG/24HR TD PT24: 14.0000 mg | MEDICATED_PATCH | Freq: Every day | TRANSDERMAL | 0 refills | Status: DC

## 2022-05-19 MED ORDER — POTASSIUM CHLORIDE CRYS ER 20 MEQ PO TBCR
40.0000 meq | EXTENDED_RELEASE_TABLET | ORAL | Status: AC
  Administered 2022-05-19 (×2): 40 meq via ORAL
  Filled 2022-05-19 (×2): qty 2

## 2022-05-19 MED ORDER — POTASSIUM CHLORIDE 10 MEQ/100ML IV SOLN
10.0000 meq | INTRAVENOUS | Status: AC
  Administered 2022-05-19 (×2): 10 meq via INTRAVENOUS
  Filled 2022-05-19 (×2): qty 100

## 2022-05-19 MED ORDER — POTASSIUM CHLORIDE CRYS ER 20 MEQ PO TBCR: 40.0000 meq | EXTENDED_RELEASE_TABLET | Freq: Once | ORAL | Status: DC

## 2022-05-19 MED ORDER — NITROGLYCERIN 0.4 % RE OINT: TOPICAL_OINTMENT | RECTAL | 0 refills | Status: DC

## 2022-05-19 NOTE — Telephone Encounter (Signed)
-----   Message from Yetta Flock, MD sent at 05/17/2022  1:59 PM EDT ----- Regarding: Danis follow up [facility], This patient will need some follow up with Dr. Loletha Carrow or APP after her discharge from the hospital. Thanks

## 2022-05-19 NOTE — Discharge Summary (Signed)
Physician Discharge Summary  Latoya Swanson [facility]:[id number] DOB: 01-Apr-1951 DOA: 05/17/2022  PCP: [name], MD  Admit date: 05/17/2022 Discharge date: 05/19/2022  Admitted From: home Disposition:  home  Recommendations for Outpatient Follow-up:  Follow up with PCP in 1-2 weeks Follow up with Dr Loletha Carrow as scheduled  Home Health: none Equipment/Devices: none  Discharge Condition: stable CODE STATUS: Full code Diet Orders (From admission, onward)     Start     Ordered   05/17/22 1120  Diet regular Room service appropriate? Yes; Fluid consistency: Thin  Diet effective now       Question Answer Comment  Room service appropriate? Yes   Fluid consistency: Thin      05/17/22 1119            HPI: Per admitting MD, Latoya Swanson is a 71 y.o. female with medical history significant of HTN, HLD, GERD, EtOH abuse, alcoholic cirrhosis, HTN, anxiety. Presenting with abdominal pain and rectal bleeding. She reports that she has been on a binge since her husband died 05-10-2023. She has been depressed and generally feeling bad. She denies SI/HI. She reports over the last several days, she had had gnawing epigastric pain. She has had N/V and poor appetite. She started seeing [facility] over the last 2 days. She didn't try any therapies for her symptoms. She states her last drink was 2 days ago, but she is unable to tell me how much she was drinking. When her symptoms persisted last night, she decided to come to the ED for assistance. While waiting in triage, she apparently had a syncopal episode. She is unsure of what happen but may have had seizure like activity. She denies any other aggravating or alleviating factors  Hospital Course / Discharge diagnoses: Principal Problem:   GI bleeding Active Problems:   Depression   Essential hypertension   GERD   Tobacco use   Anxiety   Alcoholic cirrhosis (HCC)   Alcohol abuse   HLD (hyperlipidemia)   Syncope   Principal problem Alcohol abuse,  withdrawal-patient was admitted to the hospital with bright red blood per rectum, but also had withdrawal symptoms.  She was placed on CIWA, eventually withdrawals overall improved.  She did not have any seizures, confusion, hallucinations.  She is asking to go home, alert and oriented x4, she will be staying with her niece and will be discharged home in stable condition.  She will be given a short course of Ativan to assist with cravings.   Active problems Bright red blood per rectum-GI consulted and evaluated patient on 7/8.  It is believed based on physical exam that she may have an anal fissure.  Recommended topical lidocaine ointment, and use nitroglycerin ointment 3 times daily when discharged.  GI signed off.  Hemoglobin stable, no further bleeding  Syncope-due to EtOH use. Alcohol induced liver cirrhosis, elevated LFTs-monitor, no clear-cut decompensation at this point.  Counseled for cessation Hypokalemia-repleted prior to discharge Tobacco use-nicotine patch Essential hypertension-continue home regimen with amlodipine Anxiety, Depression - resume home regimen GERD- PPI  Sepsis ruled out   Discharge Instructions   Allergies as of 05/19/2022       Reactions   Aspirin    Causes stomach bleeds hx of ulcers        Medication List     TAKE these medications    amLODipine 10 MG tablet Commonly known as: NORVASC TAKE 1 TABLET BY MOUTH EVERY DAY   atorvastatin 40 MG tablet Commonly  known as: LIPITOR Take 1 tablet (40 mg total) by mouth daily.   gabapentin 300 MG capsule Commonly known as: NEURONTIN Take 300 mg by mouth daily as needed (for nerve pain).   hydrOXYzine 25 MG tablet Commonly known as: ATARAX TAKE ONE UP TO THREE TIMES A DAY AS NEEDED ANXIETY What changed:  how much to take how to take this when to take this reasons to take this additional instructions   LORazepam 0.5 MG tablet Commonly known as: Ativan Take 1 tablet (0.5 mg total) by mouth every 8  (eight) hours as needed for anxiety.   nicotine 14 mg/24hr patch Commonly known as: NICODERM CQ - dosed in mg/24 hours Place 1 patch (14 mg total) onto the skin daily. Start taking on: May 20, 2022   omeprazole 20 MG capsule Commonly known as: PRILOSEC Take 1 capsule (20 mg total) by mouth 2 (two) times daily before a meal for 14 days. What changed:  when to take this reasons to take this   sertraline 25 MG tablet Commonly known as: ZOLOFT TAKE 2 TABLETS BY MOUTH EVERY DAY   traZODone 50 MG tablet Commonly known as: DESYREL Take 1/2 to 2 tabs at bedtime for sleep What changed:  how much to take how to take this when to take this reasons to take this additional instructions   triamterene-hydrochlorothiazide 37.5-25 MG tablet Commonly known as: MAXZIDE-25 TAKE 1 TABLET BY MOUTH EVERY DAY   vitamin B-12 500 MCG tablet Commonly known as: CYANOCOBALAMIN Take 500 mcg by mouth daily.   Vitamin D-3 25 MCG (1000 UT) Caps Take 1 capsule (1,000 Units total) by mouth daily.   VITAMIN E PO Take 1 capsule by mouth daily.       Consultations: GI  Procedures/Studies:  CT Abdomen Pelvis W Contrast  Result Date: 05/17/2022 CLINICAL DATA:  Abdominal pain.  History of cirrhosis EXAM: CT ABDOMEN AND PELVIS WITH CONTRAST TECHNIQUE: Multidetector CT imaging of the abdomen and pelvis was performed using the standard protocol following bolus administration of intravenous contrast. RADIATION DOSE REDUCTION: This exam was performed according to the departmental dose-optimization program which includes automated exposure control, adjustment of the mA and/or kV according to patient size and/or use of iterative reconstruction technique. CONTRAST:  128m OMNIPAQUE IOHEXOL 300 MG/ML  SOLN COMPARISON:  04/30/2012 FINDINGS: Lower chest:  No contributory findings. Hepatobiliary: Hepatic steatosis. Cirrhotic liver lobulation. No focal mass is seen.No evidence of biliary obstruction or stone.  Pancreas: Unremarkable. Spleen: Unremarkable. Adrenals/Urinary Tract: Negative adrenals. No hydronephrosis or stone. Unremarkable bladder. Stomach/Bowel:  No obstruction. No appendicitis. Vascular/Lymphatic: No acute vascular abnormality. Atheromatous calcification of the aorta and branch vessels. No mass or adenopathy. Reproductive:Uterine atrophy around partially calcified fibroids. Other: No ascites or pneumoperitoneum. Musculoskeletal: No acute abnormalities. Generalized lumbar spine degeneration with mild L3-4 anterolisthesis. L3-4 spinal stenosis. IMPRESSION: 1. No acute finding. 2. Cirrhosis. Electronically Signed   By: JJorje GuildM.D.   On: 05/17/2022 05:38   CT Head Wo Contrast  Result Date: 05/17/2022 CLINICAL DATA:  Head trauma and abdominal pain EXAM: CT HEAD WITHOUT CONTRAST TECHNIQUE: Contiguous axial images were obtained from the base of the skull through the vertex without intravenous contrast. RADIATION DOSE REDUCTION: This exam was performed according to the departmental dose-optimization program which includes automated exposure control, adjustment of the mA and/or kV according to patient size and/or use of iterative reconstruction technique. COMPARISON:  None Available. FINDINGS: Brain: No evidence of acute infarction, hemorrhage, hydrocephalus, extra-axial collection or mass lesion/mass effect. Chronic  small vessel ischemic change in the deep cerebral white matter. Brain volume is normal. Vascular: No hyperdense vessel or unexpected calcification. Skull: Normal. Negative for fracture or focal lesion. Sinuses/Orbits: No acute finding. IMPRESSION: 1. No acute finding. 2. Chronic small vessel ischemia. Electronically Signed   By: Jorje Guild M.D.   On: 05/17/2022 05:30     Subjective: - no chest pain, shortness of breath, no abdominal pain, nausea or vomiting.   Discharge Exam: BP (!) 155/95   Pulse 98   Temp 98.6 F (37 C) (Oral)   Resp 18   Ht '5\' 3"'$  (1.6 m)   Wt 88.9 kg    LMP 12/01/2001   SpO2 99%   BMI 34.72 kg/m   General: Pt is alert, awake, not in acute distress Cardiovascular: RRR, S1/S2 +, no rubs, no gallops Respiratory: CTA bilaterally, no wheezing, no rhonchi Abdominal: Soft, NT, ND, bowel sounds + Extremities: no edema, no cyanosis    The results of significant diagnostics from this hospitalization (including imaging, microbiology, ancillary and laboratory) are listed below for reference.     Microbiology: No results found for this or any previous visit (from the past 240 hour(s)).   Labs: Basic Metabolic Panel: Recent Labs  Lab 05/17/22 0217 05/17/22 1908 05/18/22 0040 05/19/22 0349 05/19/22 1333  NA 142  --  141 139  --   K 3.5  --  3.3* 2.9* 3.4*  CL 116*  --  115* 110  --   CO2 18*  --  18* 21*  --   GLUCOSE 112*  --  100* 106*  --   BUN 16  --  18 9  --   CREATININE 0.69  --  0.76 0.54  --   CALCIUM 9.1  --  8.7* 8.6*  --   MG  --  2.0  --  1.8  --   PHOS  --  2.7  --   --   --    Liver Function Tests: Recent Labs  Lab 05/17/22 0217 05/18/22 0040 05/19/22 0349  AST 111* 80* 86*  ALT 99* 78* 72*  ALKPHOS 110 89 82  BILITOT 0.8 1.6* 1.3*  PROT 8.5* 7.1 6.9  ALBUMIN 4.6 3.9 3.9   CBC: Recent Labs  Lab 05/17/22 0217 05/17/22 1908 05/18/22 0040 05/18/22 0658 05/19/22 0349  WBC 7.4  --  8.2  --  5.8  HGB 15.6* 13.4 13.0  12.8 12.4 11.8*  HCT 45.9 40.0 39.5  38.0 36.6 34.7*  MCV 91.8  --  96.6  --  93.0  PLT 213  --  155  --  137*   CBG: No results for input(s): "GLUCAP" in the last 168 hours. Hgb A1c No results for input(s): "HGBA1C" in the last 72 hours. Lipid Profile No results for input(s): "CHOL", "HDL", "LDLCALC", "TRIG", "CHOLHDL", "LDLDIRECT" in the last 72 hours. Thyroid function studies No results for input(s): "TSH", "T4TOTAL", "T3FREE", "THYROIDAB" in the last 72 hours.  Invalid input(s): "FREET3" Urinalysis    Component Value Date/Time   COLORURINE YELLOW 05/17/2022 [place] 05/17/2022 0544   LABSPEC >1.046 (H) 05/17/2022 0544   PHURINE 5.0 05/17/2022 0544   GLUCOSEU NEGATIVE 05/17/2022 0544   HGBUR SMALL (A) 05/17/2022 0544   HGBUR negative 04/13/2009 1012   BILIRUBINUR NEGATIVE 05/17/2022 0544   BILIRUBINUR negative 01/26/2018 1103   BILIRUBINUR NEG 09/05/2015 1054   KETONESUR NEGATIVE 05/17/2022 0544   PROTEINUR >=300 (A) 05/17/2022 0544   UROBILINOGEN 0.2 01/26/2018  1103   UROBILINOGEN 1.0 09/20/2015 1816   NITRITE NEGATIVE 05/17/2022 0544   LEUKOCYTESUR NEGATIVE 05/17/2022 0544    FURTHER DISCHARGE INSTRUCTIONS:   Get Medicines reviewed and adjusted: Please take all your medications with you for your next visit with your Primary MD   Laboratory/radiological data: Please request your Primary MD to go over all hospital tests and procedure/radiological results at the follow up, please ask your Primary MD to get all Hospital records sent to his/her office.   In some cases, they will be blood work, cultures and biopsy results pending at the time of your discharge. Please request that your primary care M.D. goes through all the records of your hospital data and follows up on these results.   Also Note the following: If you experience worsening of your admission symptoms, develop shortness of breath, life threatening emergency, suicidal or homicidal thoughts you must seek medical attention immediately by calling 911 or calling your MD immediately  if symptoms less severe.   You must read complete instructions/literature along with all the possible adverse reactions/side effects for all the Medicines you take and that have been prescribed to you. Take any new Medicines after you have completely understood and accpet all the possible adverse reactions/side effects.    Do not drive when taking Pain medications or sleeping medications (Benzodaizepines)   Do not take more than prescribed Pain, Sleep and Anxiety Medications. It is not  advisable to combine anxiety,sleep and pain medications without talking with your primary care practitioner   Special Instructions: If you have smoked or chewed Tobacco  in the last 2 yrs please stop smoking, stop any regular Alcohol  and or any Recreational drug use.   Wear Seat belts while driving.   Please note: You were cared for by a hospitalist during your hospital stay. Once you are discharged, your primary care physician will handle any further medical issues. Please note that NO REFILLS for any discharge medications will be authorized once you are discharged, as it is imperative that you return to your primary care physician (or establish a relationship with a primary care physician if you do not have one) for your post hospital discharge needs so that they can reassess your need for medications and monitor your lab values.  Time coordinating discharge: 35 minutes  SIGNED:  Marzetta Board, MD, PhD 05/19/2022, 2:13 PM

## 2022-05-19 NOTE — Plan of Care (Signed)
  Problem: Education: Goal: Knowledge of General Education information will improve Description: Including pain rating scale, medication(s)/side effects and non-pharmacologic comfort measures Outcome: Completed/Met   Problem: Health Behavior/Discharge Planning: Goal: Ability to manage health-related needs will improve Outcome: Completed/Met   Problem: Clinical Measurements: Goal: Ability to maintain clinical measurements within normal limits will improve Outcome: Completed/Met Goal: Will remain free from infection Outcome: Completed/Met Goal: Diagnostic test results will improve Outcome: Completed/Met Goal: Respiratory complications will improve Outcome: Completed/Met Goal: Cardiovascular complication will be avoided Outcome: Completed/Met   Problem: Activity: Goal: Risk for activity intolerance will decrease Outcome: Completed/Met   Problem: Nutrition: Goal: Adequate nutrition will be maintained Outcome: Completed/Met   Problem: Coping: Goal: Level of anxiety will decrease Outcome: Completed/Met   Problem: Elimination: Goal: Will not experience complications related to bowel motility Outcome: Completed/Met Goal: Will not experience complications related to urinary retention Outcome: Completed/Met   Problem: Pain Managment: Goal: General experience of comfort will improve Outcome: Completed/Met   Problem: Safety: Goal: Ability to remain free from injury will improve Outcome: Completed/Met   Problem: Skin Integrity: Goal: Risk for impaired skin integrity will decrease Outcome: Completed/Met   Problem: Education: Goal: Knowledge of General Education information will improve Description: Including pain rating scale, medication(s)/side effects and non-pharmacologic comfort measures Outcome: Completed/Met   Problem: Health Behavior/Discharge Planning: Goal: Ability to manage health-related needs will improve Outcome: Completed/Met   Problem: Clinical  Measurements: Goal: Ability to maintain clinical measurements within normal limits will improve Outcome: Completed/Met Goal: Will remain free from infection Outcome: Completed/Met Goal: Diagnostic test results will improve Outcome: Completed/Met Goal: Respiratory complications will improve Outcome: Completed/Met Goal: Cardiovascular complication will be avoided Outcome: Completed/Met   Problem: Activity: Goal: Risk for activity intolerance will decrease Outcome: Completed/Met   Problem: Nutrition: Goal: Adequate nutrition will be maintained Outcome: Completed/Met   Problem: Coping: Goal: Level of anxiety will decrease Outcome: Completed/Met   Problem: Elimination: Goal: Will not experience complications related to bowel motility Outcome: Completed/Met Goal: Will not experience complications related to urinary retention Outcome: Completed/Met   Problem: Pain Managment: Goal: General experience of comfort will improve Outcome: Completed/Met   Problem: Safety: Goal: Ability to remain free from injury will improve Outcome: Completed/Met   Problem: Skin Integrity: Goal: Risk for impaired skin integrity will decrease Outcome: Completed/Met   Problem: Education: Goal: Knowledge of General Education information will improve Description: Including pain rating scale, medication(s)/side effects and non-pharmacologic comfort measures Outcome: Completed/Met   Problem: Health Behavior/Discharge Planning: Goal: Ability to manage health-related needs will improve Outcome: Completed/Met   Problem: Clinical Measurements: Goal: Ability to maintain clinical measurements within normal limits will improve Outcome: Completed/Met Goal: Will remain free from infection Outcome: Completed/Met Goal: Diagnostic test results will improve Outcome: Completed/Met Goal: Respiratory complications will improve Outcome: Completed/Met Goal: Cardiovascular complication will be avoided Outcome:  Completed/Met   Problem: Activity: Goal: Risk for activity intolerance will decrease Outcome: Completed/Met   Problem: Nutrition: Goal: Adequate nutrition will be maintained Outcome: Completed/Met   Problem: Coping: Goal: Level of anxiety will decrease Outcome: Completed/Met   Problem: Elimination: Goal: Will not experience complications related to bowel motility Outcome: Completed/Met Goal: Will not experience complications related to urinary retention Outcome: Completed/Met   Problem: Pain Managment: Goal: General experience of comfort will improve Outcome: Completed/Met   Problem: Safety: Goal: Ability to remain free from injury will improve Outcome: Completed/Met   Problem: Skin Integrity: Goal: Risk for impaired skin integrity will decrease Outcome: Completed/Met   

## 2022-05-19 NOTE — Telephone Encounter (Signed)
Called and spoke with patient. She has been scheduled for a hospital follow up with Dr. Loletha Carrow on Thursday, 06/19/22 at 10 am. Pt is aware that I will mail her appt information, she confirmed her address on file. Pt had no concerns at the end of the call.

## 2022-05-21 ENCOUNTER — Other Ambulatory Visit: Payer: Self-pay | Admitting: Family Medicine

## 2022-05-22 ENCOUNTER — Telehealth: Payer: Self-pay | Admitting: Family Medicine

## 2022-05-22 NOTE — Telephone Encounter (Signed)
Spoke with her at home. Feeling better but having back pain No drinking Neighbor is helping out Is setting up grief counseling with the [place] and involved in her church She has appointment to see me on Tuesday Asked her to call me if any problems before then

## 2022-05-27 ENCOUNTER — Encounter: Payer: Self-pay | Admitting: Family Medicine

## 2022-05-27 ENCOUNTER — Other Ambulatory Visit: Payer: Self-pay

## 2022-05-27 ENCOUNTER — Ambulatory Visit (INDEPENDENT_AMBULATORY_CARE_PROVIDER_SITE_OTHER): Payer: Medicare HMO | Admitting: Family Medicine

## 2022-05-27 DIAGNOSIS — I10 Essential (primary) hypertension: Secondary | ICD-10-CM

## 2022-05-27 DIAGNOSIS — F1010 Alcohol abuse, uncomplicated: Secondary | ICD-10-CM

## 2022-05-27 DIAGNOSIS — K7030 Alcoholic cirrhosis of liver without ascites: Secondary | ICD-10-CM

## 2022-05-27 DIAGNOSIS — M5386 Other specified dorsopathies, lumbar region: Secondary | ICD-10-CM

## 2022-05-27 DIAGNOSIS — F4321 Adjustment disorder with depressed mood: Secondary | ICD-10-CM

## 2022-05-27 DIAGNOSIS — K602 Anal fissure, unspecified: Secondary | ICD-10-CM | POA: Diagnosis not present

## 2022-05-27 MED ORDER — TRAMADOL HCL 50 MG PO TABS: 50.0000 mg | ORAL_TABLET | Freq: Two times a day (BID) | ORAL | 0 refills | Status: AC | PRN

## 2022-05-27 MED ORDER — GABAPENTIN 300 MG PO CAPS: 300.0000 mg | ORAL_CAPSULE | Freq: Two times a day (BID) | ORAL | 1 refills | Status: DC | PRN

## 2022-05-27 MED ORDER — ONDANSETRON HCL 4 MG PO TABS: 4.0000 mg | ORAL_TABLET | Freq: Three times a day (TID) | ORAL | 0 refills | Status: DC | PRN

## 2022-05-27 NOTE — Patient Instructions (Signed)
Good to see you today - Thank you for coming in  Things we discussed today:  For Pain - Take gabapentin twice a day  - Tramadol for severe pain - Trazadone at night - I will refer you Physical Therapy  I will call you if your tests are not good.  Otherwise, I will send you a message on MyChart (if it is active) or a letter in the mail..  If you do not hear from me with in 2 weeks please call our office.     You need to stop smoking!  Call [place] (1-800-QUIT-NOW) for ideas  Choose a quit date when you will stop completely.  Get prepared by slowly cutting down.  Find a substitute to use when you think you need a cigarette.  If you wish to discuss nicotine replacement (patches, gum) please make an appointment   My cell is [phone] If you need me in an emergency  Please always bring your medication bottles  Come back to see me in 3 weeks

## 2022-05-27 NOTE — Assessment & Plan Note (Signed)
Worsened. Will add low dose tramadol and refer to Physical Therapy

## 2022-05-27 NOTE — Assessment & Plan Note (Signed)
Reports is dong well.  [facility]  Continue gabapentin.   See back frequently

## 2022-05-27 NOTE — Progress Notes (Signed)
    SUBJECTIVE:   CHIEF COMPLAINT / HPI:   ETOH Abuse Hospitalized from 7/8-10 for acute alcohol use and rectal bleeding  Has not had any alcohol since discharge.  Does not have any cravings.  If she did she would contact her neighbor [name].  Does not have any in the house  Mood Feels overall better.   [facility].  Does not have any suicidal ideation.   Taking sertraline regularly   Back Hip Pain This has worsened since she was in the hospital.  No falls but walks with cane.  No new symptoms of weakness or swelling but her usual pain exacerbated. Takes gabapentin twice a day and trazadone in the evenings.  Can't take Tylenol makes her nauseas or NSAIDS with history of gi bleed   PERTINENT  PMH / PSH: continues to smoke  OBJECTIVE:   BP 131/87   Pulse 81   Wt 199 lb 12.8 oz (90.6 kg)   LMP 12/01/2001   SpO2 99%   BMI 35.39 kg/m   Heart - Regular rate and rhythm.  No murmurs, gallops or rubs.    Lungs:  Normal respiratory effort, chest expands symmetrically. Lungs are clear to auscultation, no crackles or wheezes. Psych:  Cognition and judgment appear intact. Alert, communicative  and cooperative with normal attention span and concentration. No apparent delusions, illusions, hallucinations   ASSESSMENT/PLAN:   Anal fissure No pain or bleeding now.  Nitro paste is giving her headaches.  Told ok to stop   Sciatica associated with disorder of lumbar spine Worsened. Will add low dose tramadol and refer to Physical Therapy   Alcohol abuse Reports is dong well.  [facility].  Continue gabapentin.   See back frequently   Grief Seems stable.  Continue sertraline and hydroxyzine.  [facility].  Close follow up   Alcoholic cirrhosis ([place]) Reports cessation.  Will check follow up labs     Patient Instructions  Good to see you today - Thank you for coming in  Things we discussed today:  For  Pain - Take gabapentin twice a day  - Tramadol for severe pain - Trazadone at night - I will refer you Physical Therapy  I will call you if your tests are not good.  Otherwise, I will send you a message on MyChart (if it is active) or a letter in the mail..  If you do not hear from me with in 2 weeks please call our office.     You need to stop smoking!  Call [place] (1-800-QUIT-NOW) for ideas  Choose a quit date when you will stop completely.  Get prepared by slowly cutting down.  Find a substitute to use when you think you need a cigarette.  If you wish to discuss nicotine replacement (patches, gum) please make an appointment   My cell is [phone] If you need me in an emergency  Please always bring your medication bottles  Come back to see me in 3 weeks   [name], [place]

## 2022-05-27 NOTE — Assessment & Plan Note (Signed)
Seems stable.  Continue sertraline and hydroxyzine.  [facility]  Close follow up

## 2022-05-27 NOTE — Assessment & Plan Note (Signed)
Reports cessation.  Will check follow up labs

## 2022-05-27 NOTE — Assessment & Plan Note (Signed)
No pain or bleeding now.  Nitro paste is giving her headaches.  Told ok to stop

## 2022-05-28 ENCOUNTER — Encounter: Payer: Self-pay | Admitting: Family Medicine

## 2022-05-28 LAB — CMP14+EGFR
ALT: 71 IU/L — ABNORMAL HIGH (ref 0–32)
AST: 61 IU/L — ABNORMAL HIGH (ref 0–40)
Albumin/Globulin Ratio: 1.6 (ref 1.2–2.2)
Albumin: 4.6 g/dL (ref 3.9–4.9)
Alkaline Phosphatase: 113 IU/L (ref 44–121)
BUN/Creatinine Ratio: 14 (ref 12–28)
BUN: 11 mg/dL (ref 8–27)
Bilirubin Total: 0.4 mg/dL (ref 0.0–1.2)
CO2: 20 mmol/L (ref 20–29)
Calcium: 9.8 mg/dL (ref 8.7–10.3)
Chloride: 105 mmol/L (ref 96–106)
Creatinine, Ser: 0.81 mg/dL (ref 0.57–1.00)
Globulin, Total: 2.8 g/dL (ref 1.5–4.5)
Glucose: 92 mg/dL (ref 70–99)
Potassium: 3.5 mmol/L (ref 3.5–5.2)
Sodium: 143 mmol/L (ref 134–144)
Total Protein: 7.4 g/dL (ref 6.0–8.5)
eGFR: 78 mL/min/{1.73_m2} (ref >59)

## 2022-05-28 LAB — CBC
Hematocrit: 38.3 % (ref 34.0–46.6)
Hemoglobin: 13.1 g/dL (ref 11.1–15.9)
MCH: 31.1 pg (ref 26.6–33.0)
MCHC: 34.2 g/dL (ref 31.5–35.7)
MCV: 91 fL (ref 79–97)
Platelets: 176 10*3/uL (ref 150–450)
RBC: 4.21 x10E6/uL (ref 3.77–5.28)
RDW: 13 % (ref 11.7–15.4)
WBC: 7.7 10*3/uL (ref 3.4–10.8)

## 2022-06-13 ENCOUNTER — Other Ambulatory Visit: Payer: Self-pay | Admitting: Family Medicine

## 2022-06-17 ENCOUNTER — Telehealth: Payer: Self-pay | Admitting: Family Medicine

## 2022-06-17 ENCOUNTER — Ambulatory Visit: Payer: Medicare HMO | Admitting: Family Medicine

## 2022-06-17 NOTE — Telephone Encounter (Signed)
Called home number   She answered.  Says all is good forgot appointment due to taking care of legal issues She will make another appt

## 2022-06-17 NOTE — Progress Notes (Deleted)
    SUBJECTIVE:   CHIEF COMPLAINT / HPI:   ETOH Abuse Hospitalized from 7/8-10 for acute alcohol use and rectal bleeding  Has not had any alcohol since discharge.  Does not have any cravings.  If she did she would contact her neighbor [name].  Does not have any in the house   Mood Feels overall better.   [facility].  Does not have any suicidal ideation.   Taking sertraline regularly    Back Hip Pain This has worsened since she was in the hospital.  No falls but walks with cane.  No new symptoms of weakness or swelling but her usual pain exacerbated. Takes gabapentin twice a day and trazadone in the evenings.  Can't take Tylenol makes her nauseas or NSAIDS with history of gi bleed    PERTINENT  PMH / PSH: ***  OBJECTIVE:   LMP 12/01/2001   ***  ASSESSMENT/PLAN:   No problem-specific Assessment & Plan notes found for this encounter.     [name], MD [place]

## 2022-06-19 ENCOUNTER — Ambulatory Visit: Payer: Medicare HMO | Admitting: Gastroenterology

## 2022-07-01 ENCOUNTER — Other Ambulatory Visit: Payer: Self-pay | Admitting: *Deleted

## 2022-07-01 MED ORDER — HYDROXYZINE HCL 25 MG PO TABS: 25.0000 mg | ORAL_TABLET | Freq: Three times a day (TID) | ORAL | 1 refills | Status: DC | PRN

## 2022-07-02 ENCOUNTER — Ambulatory Visit (INDEPENDENT_AMBULATORY_CARE_PROVIDER_SITE_OTHER): Payer: Medicare HMO | Admitting: Family Medicine

## 2022-07-02 ENCOUNTER — Other Ambulatory Visit: Payer: Self-pay

## 2022-07-02 ENCOUNTER — Encounter: Payer: Self-pay | Admitting: Family Medicine

## 2022-07-02 DIAGNOSIS — Z720 Tobacco use: Secondary | ICD-10-CM | POA: Diagnosis not present

## 2022-07-02 DIAGNOSIS — M5386 Other specified dorsopathies, lumbar region: Secondary | ICD-10-CM

## 2022-07-02 DIAGNOSIS — I10 Essential (primary) hypertension: Secondary | ICD-10-CM

## 2022-07-02 DIAGNOSIS — K7030 Alcoholic cirrhosis of liver without ascites: Secondary | ICD-10-CM

## 2022-07-02 DIAGNOSIS — F419 Anxiety disorder, unspecified: Secondary | ICD-10-CM | POA: Diagnosis not present

## 2022-07-02 MED ORDER — CYCLOBENZAPRINE HCL 5 MG PO TABS: 5.0000 mg | ORAL_TABLET | Freq: Two times a day (BID) | ORAL | 1 refills | Status: DC | PRN

## 2022-07-02 NOTE — Assessment & Plan Note (Signed)
Moderately controlled.  No signs of neural compromise or cancer.  Will try muscle relaxer instead of tramadol and continue exercise and as needed gabapentin

## 2022-07-02 NOTE — Assessment & Plan Note (Signed)
Stable by symptoms. Will recheck LFTs next visit

## 2022-07-02 NOTE — Progress Notes (Signed)
    SUBJECTIVE:   CHIEF COMPLAINT / HPI:   ETOH Abuse Hospitalized from 7/8-10 for acute alcohol use  Has not had any alcohol since discharge.  Does not have any cravings.  If she did she would contact her neighbor Latoya Swanson.  Does not have any in the house   Mood PHQ9 - 6 Overall better.  Taking sertraline daily and Hydroxyzine twice a day-three times a day Talking with a neighbor.  Does not feel needs anything else   Back Hip Pain Having lower back spasms on and off.  No weakness or incontinence.  No falls but walks with cane.  Feels gabapentin twice a day does not help.  Would rather try a muscle relaxer than  trazadone.   Can't take Tylenol makes her nauseas or NSAIDS with history of gi bleed    PERTINENT  PMH / PSH: living alone after husbands recent death  OBJECTIVE:   BP 132/84   Pulse 94   Wt 198 lb 12.8 oz (90.2 kg)   LMP 12/01/2001   SpO2 99%   BMI 35.22 kg/m   Good spirits Heart - Regular rate and rhythm.  No murmurs, gallops or rubs.    Lungs:  Normal respiratory effort, chest expands symmetrically. Lungs are clear to auscultation, no crackles or wheezes. Able to walk easily and can get up on heels and toes and do a deep knee bend moderately No SLR Back - tender bilaterall lumbar strap muscles without verterbral body tenderness   ASSESSMENT/PLAN:   Essential hypertension BP Readings from Last 3 Encounters:  07/02/22 132/84  05/27/22 131/87  05/19/22 (!) 148/89   At goal continue current medications   Alcoholic cirrhosis (HCC) Stable by symptoms. Will recheck LFTs next visit  Sciatica associated with disorder of lumbar spine Moderately controlled.  No signs of neural compromise or cancer.  Will try muscle relaxer instead of tramadol and continue exercise and as needed gabapentin   Anxiety Moderate control.  Continue sertraline as needed hydroxyzine and exercise    Patient Instructions  Good to see you today - Thank you for coming in  Things we  discussed today:  BP Try to find the blood pressure  Your goal blood pressure is less than 140/90.  Check your blood pressure several times a week.  If regularly higher than this please let me know - either with MyChart or leaving a phone message. Next visit please bring in your blood pressure cuff.     Pain Take flexaril 5 mg up to twice a day Stretch and do exercise every day Gabapentin twice a day as needed Consider Physical Therapy   Anxiety Sertraline 50 - 2 tabs Hydroxyzine as needed Keep exercising  Please always bring your medication bottles  Come back to see me in 2 months    Latoya Swanson, [place]

## 2022-07-02 NOTE — Patient Instructions (Signed)
Good to see you today - Thank you for coming in  Things we discussed today:  BP Try to find the blood pressure  Your goal blood pressure is less than 140/90.  Check your blood pressure several times a week.  If regularly higher than this please let me know - either with MyChart or leaving a phone message. Next visit please bring in your blood pressure cuff.     Pain Take flexaril 5 mg up to twice a day Stretch and do exercise every day Gabapentin twice a day as needed Consider Physical Therapy   Anxiety Sertraline 50 - 2 tabs Hydroxyzine as needed Keep exercising  Please always bring your medication bottles  Come back to see me in 2 months

## 2022-07-02 NOTE — Assessment & Plan Note (Signed)
BP Readings from Last 3 Encounters:  07/02/22 132/84  05/27/22 131/87  05/19/22 (!) 148/89   At goal continue current medications

## 2022-07-02 NOTE — Assessment & Plan Note (Signed)
Moderate control.  Continue sertraline as needed hydroxyzine and exercise

## 2022-07-02 NOTE — Assessment & Plan Note (Signed)
Need to review next visit

## 2022-08-08 ENCOUNTER — Other Ambulatory Visit: Payer: Self-pay | Admitting: Family Medicine

## 2022-08-18 ENCOUNTER — Other Ambulatory Visit: Payer: Self-pay | Admitting: Family Medicine

## 2022-08-23 ENCOUNTER — Other Ambulatory Visit: Payer: Self-pay | Admitting: Family Medicine

## 2022-09-01 NOTE — Progress Notes (Signed)
SUBJECTIVE:   CHIEF COMPLAINT / HPI:   Essential hypertension Did not bring her medications but does recall them.  Feels she is a lot of pain today which raise her blood pressure.  No further chest pain or edema   Alcoholic cirrhosis (HCC) Denies any alcohol.  No abdomen pain    Sciatica associated with disorder of lumbar spine She feels this is worse. No change in character or any unusual weakness just continued pain.   Flexeril did not help.  Doesn't seem that gabapentin 300 mg is helping anymore - doesn't even make her sleepy.  Cant take tylenol or ibuprofen.  Not interested in Physical Therapy  Anxiety Still bothers her especially when alone but overall improving.  Is low on funds trying to get her deceased husbands finances straight therefore can not go out as much as would like     OBJECTIVE:   BP (!) 146/80   Pulse 66   Ht 5' 4.5" (1.638 m)   Wt 204 lb 12.8 oz (92.9 kg)   LMP 12/01/2001   SpO2 95%   BMI 34.61 kg/m   Psych:  Cognition and judgment appear intact. Alert, communicative  and cooperative with normal attention span and concentration. No apparent delusions, illusions, hallucinations Heart - Regular rate and rhythm.  No murmurs, gallops or rubs.    Lungs:  Normal respiratory effort, chest expands symmetrically. Lungs are clear to auscultation, no crackles or wheezes. Extremities:  No edema Walks with a cane rises without problem and able to walk around the room without assistance   ASSESSMENT/PLAN:   Need for immunization against influenza -     Flu Vaccine QUAD High Dose(Fluad)  Cirrhosis of liver without ascites, unspecified hepatic cirrhosis type ([place]) -     Hepatic function panel  Sciatica associated with disorder of lumbar spine Assessment & Plan: Worsened - likely her normal waxing of her chronic low back pain.  No new symptoms or concerning findings.  Will change muscle relaxers add back low dose tramadol (has taken this and narcotics in the  past) and suggest trying increased gabapentin.  Warned her about sedation and fall risk but given her degree of distress I think these medications are worth the risk   Orders: -     tiZANidine HCl; Take 1 tablet (4 mg total) by mouth every 8 (eight) hours as needed for muscle spasms.  Dispense: 90 tablet; Refill: 0 -     traMADol HCl; Take 1 tablet (50 mg total) by mouth every 12 (twelve) hours as needed.  Dispense: 30 tablet; Refill: 0  Essential hypertension Assessment & Plan: BP Readings from Last 3 Encounters:  09/02/22 (!) 146/80  07/02/22 132/84  05/27/22 131/87   Not at goal today but is in pain.  She has a new blood pressure cuff from a friend.  Monitor at home    Alcoholic cirrhosis of liver without ascites [facility]) Assessment & Plan: Denies alcohol.  Will check labs    Grief Assessment & Plan: Mild improvement.  Continue SSRI and encourage activity    Other orders -     Zoster Vac Recomb Adjuvanted; Inject 0.5 ml IM and Repeat in 2 months  Dispense: 0.5 mL; Refill: 1     Patient Instructions  Good to see you today - Thank you for coming in  Things we discussed today:  Back Pain Try the zanaflex muscle relaxer twice a day as needed Tramadol 50 mg when really hurting May increase gabapentin 2  tabs at night Be careful these will make you sleepy   Your goal blood pressure is less than 140/90.  Check your blood pressure several times a week.  If regularly higher than this please let me know - either with MyChart or leaving a phone message. Next visit please bring in your blood pressure cuff.     I sent in the shingles shot   I will call you if your lab tests are not normal.  Otherwise we will discuss them at your next visit.   Please always bring your medication bottles  Come back to see me in 1 month   [name], MD [place]

## 2022-09-02 ENCOUNTER — Ambulatory Visit (INDEPENDENT_AMBULATORY_CARE_PROVIDER_SITE_OTHER): Payer: Medicare HMO | Admitting: Family Medicine

## 2022-09-02 ENCOUNTER — Encounter: Payer: Self-pay | Admitting: Family Medicine

## 2022-09-02 VITALS — BP 146/80 | HR 66 | Ht 64.5 in | Wt 204.8 lb

## 2022-09-02 DIAGNOSIS — K7460 Unspecified cirrhosis of liver: Secondary | ICD-10-CM | POA: Diagnosis not present

## 2022-09-02 DIAGNOSIS — Z23 Encounter for immunization: Secondary | ICD-10-CM

## 2022-09-02 DIAGNOSIS — M5386 Other specified dorsopathies, lumbar region: Secondary | ICD-10-CM

## 2022-09-02 DIAGNOSIS — K7030 Alcoholic cirrhosis of liver without ascites: Secondary | ICD-10-CM | POA: Diagnosis not present

## 2022-09-02 DIAGNOSIS — F4321 Adjustment disorder with depressed mood: Secondary | ICD-10-CM | POA: Diagnosis not present

## 2022-09-02 DIAGNOSIS — I10 Essential (primary) hypertension: Secondary | ICD-10-CM | POA: Diagnosis not present

## 2022-09-02 MED ORDER — TRAMADOL HCL 50 MG PO TABS: 50.0000 mg | ORAL_TABLET | Freq: Two times a day (BID) | ORAL | 0 refills | Status: DC | PRN

## 2022-09-02 MED ORDER — TIZANIDINE HCL 4 MG PO TABS: 4.0000 mg | ORAL_TABLET | Freq: Three times a day (TID) | ORAL | 0 refills | Status: DC | PRN

## 2022-09-02 MED ORDER — ZOSTER VAC RECOMB ADJUVANTED 50 MCG/0.5ML IM SUSR: INTRAMUSCULAR | 1 refills | Status: DC

## 2022-09-02 NOTE — Patient Instructions (Signed)
Good to see you today - Thank you for coming in  Things we discussed today:  Back Pain Try the zanaflex muscle relaxer twice a day as needed Tramadol 50 mg when really hurting May increase gabapentin 2 tabs at night Be careful these will make you sleepy   Your goal blood pressure is less than 140/90.  Check your blood pressure several times a week.  If regularly higher than this please let me know - either with MyChart or leaving a phone message. Next visit please bring in your blood pressure cuff.     I sent in the shingles shot   I will call you if your lab tests are not normal.  Otherwise we will discuss them at your next visit.   Please always bring your medication bottles  Come back to see me in 1 month

## 2022-09-02 NOTE — Assessment & Plan Note (Signed)
Worsened - likely her normal waxing of her chronic low back pain.  No new symptoms or concerning findings.  Will change muscle relaxers add back low dose tramadol (has taken this and narcotics in the past) and suggest trying increased gabapentin.  Warned her about sedation and fall risk but given her degree of distress I think these medications are worth the risk

## 2022-09-02 NOTE — Assessment & Plan Note (Signed)
BP Readings from Last 3 Encounters:  09/02/22 (!) 146/80  07/02/22 132/84  05/27/22 131/87   Not at goal today but is in pain.  She has a new blood pressure cuff from a friend.  Monitor at home

## 2022-09-02 NOTE — Assessment & Plan Note (Signed)
Denies alcohol.  Will check labs

## 2022-09-02 NOTE — Assessment & Plan Note (Signed)
Mild improvement.  Continue SSRI and encourage activity

## 2022-09-03 ENCOUNTER — Encounter: Payer: Self-pay | Admitting: Family Medicine

## 2022-09-03 LAB — HEPATIC FUNCTION PANEL
ALT: 23 IU/L (ref 0–32)
AST: 25 IU/L (ref 0–40)
Albumin: 4.6 g/dL (ref 3.9–4.9)
Alkaline Phosphatase: 81 IU/L (ref 44–121)
Bilirubin Total: 0.4 mg/dL (ref 0.0–1.2)
Bilirubin, Direct: 0.15 mg/dL (ref 0.00–0.40)
Total Protein: 7 g/dL (ref 6.0–8.5)

## 2022-09-09 ENCOUNTER — Other Ambulatory Visit: Payer: Self-pay | Admitting: Family Medicine

## 2022-09-09 DIAGNOSIS — M5386 Other specified dorsopathies, lumbar region: Secondary | ICD-10-CM

## 2022-09-28 ENCOUNTER — Other Ambulatory Visit: Payer: Self-pay | Admitting: Family Medicine

## 2022-10-01 ENCOUNTER — Ambulatory Visit: Payer: Medicare HMO | Admitting: Family Medicine

## 2022-10-07 ENCOUNTER — Encounter: Payer: Self-pay | Admitting: Family Medicine

## 2022-10-07 ENCOUNTER — Ambulatory Visit (INDEPENDENT_AMBULATORY_CARE_PROVIDER_SITE_OTHER): Payer: Medicare HMO | Admitting: Family Medicine

## 2022-10-07 VITALS — BP 152/85 | HR 75 | Wt 200.4 lb

## 2022-10-07 DIAGNOSIS — Z23 Encounter for immunization: Secondary | ICD-10-CM

## 2022-10-07 DIAGNOSIS — F4321 Adjustment disorder with depressed mood: Secondary | ICD-10-CM | POA: Diagnosis not present

## 2022-10-07 DIAGNOSIS — I10 Essential (primary) hypertension: Secondary | ICD-10-CM

## 2022-10-07 DIAGNOSIS — M5386 Other specified dorsopathies, lumbar region: Secondary | ICD-10-CM

## 2022-10-07 DIAGNOSIS — M25551 Pain in right hip: Secondary | ICD-10-CM

## 2022-10-07 DIAGNOSIS — F1010 Alcohol abuse, uncomplicated: Secondary | ICD-10-CM

## 2022-10-07 NOTE — Assessment & Plan Note (Signed)
Not at goal.  Monitor at home and increase exercise.  If continues high may need additional agent

## 2022-10-07 NOTE — Progress Notes (Signed)
   Covid-19 Vaccination Clinic  Name:  Latoya Swanson    MRN: [id number] DOB: 12-29-1950  10/07/2022  Latoya Swanson declined to be observed post Covid-19 immunization She was provided with Vaccine Information Sheet and instruction to access the V-Safe system.   Latoya Swanson was instructed to call 911 with any severe reactions post vaccine: Difficulty breathing  Swelling of face and throat  A fast heartbeat  A bad rash all over body  Dizziness and weakness

## 2022-10-07 NOTE — Assessment & Plan Note (Signed)
Most likely related to her sciatica but given family history will check xray

## 2022-10-07 NOTE — Assessment & Plan Note (Signed)
Improving.  Continue activity and sertraline

## 2022-10-07 NOTE — Progress Notes (Signed)
    SUBJECTIVE:   CHIEF COMPLAINT / HPI:   Cirrhosis of liver without ascites, unspecified hepatic cirrhosis type ([place]) Not drinking any alcohol.  Her lfts were normal    Sciatica associated with disorder of lumbar spine Overall better.  Taking zanaflex as needed and tramadol rarely,  Does take gabapentin daily.  Walking outside almost every day with a cane or walking stick  No weakness  Does have pain in her R hip area and hip replacements run in her family   Essential hypertension Taking her medications.  No lightheadness or edema.  Home readings often inn 140s/80s   Grief Feels things are slowly improving Thanksgiving went ok Her grandchildren visited recently.  Taking sertraline 50 mg daily    OBJECTIVE:   BP (!) 152/85   Pulse 75   Wt 200 lb 6.4 oz (90.9 kg)   LMP 12/01/2001   SpO2 99%   BMI 33.87 kg/m   Good spirits Walking without a cane Does have pain in R hip with movement No edema Heart - Regular rate and rhythm.  No murmurs, gallops or rubs.    Lungs:  Normal respiratory effort, chest expands symmetrically. Lungs are clear to auscultation, no crackles or wheezes.   ASSESSMENT/PLAN:   Right hip pain Assessment & Plan: Most likely related to her sciatica but given family history will check xray   Orders: -     DG HIP UNILAT W OR W/O PELVIS 1V RIGHT; Future  Encounter for immunization [employer] Fall 2023 Covid-19 Vaccine 69yr and older  Essential hypertension Assessment & Plan: Not at goal.  Monitor at home and increase exercise.  If continues high may need additional agent    Grief Assessment & Plan: Improving.  Continue activity and sertraline    Alcohol abuse Assessment & Plan: Relates is not using any alcohol LFTs are normal    Sciatica associated with disorder of lumbar spine Assessment & Plan: Stable on her current medications.  She is self weaning tramadol and zanaflex.  Encourage continued exercise       Patient Instructions   Good to see you today - Thank you for coming in  Things we discussed today:  Will let you know about the hip xray  Keep walking!  Your goal blood pressure is less than 140/90.  Check your blood pressure several times a week.  If regularly higher than this please let me know - either with MyChart or leaving a phone message. Next visit please bring in your blood pressure cuff.     Please always bring your medication bottles  Come back to see me in 2 months  [place]

## 2022-10-07 NOTE — Assessment & Plan Note (Signed)
Relates is not using any alcohol LFTs are normal

## 2022-10-07 NOTE — Assessment & Plan Note (Signed)
Stable on her current medications.  She is self weaning tramadol and zanaflex.  Encourage continued exercise

## 2022-10-07 NOTE — Patient Instructions (Addendum)
Good to see you today - Thank you for coming in  Things we discussed today:  Will let you know about the hip xray  Keep walking!  Your goal blood pressure is less than 140/90.  Check your blood pressure several times a week.  If regularly higher than this please let me know - either with MyChart or leaving a phone message. Next visit please bring in your blood pressure cuff.     Please always bring your medication bottles  Come back to see me in 2 months  Happy holidays

## 2022-11-13 ENCOUNTER — Other Ambulatory Visit: Payer: Self-pay | Admitting: Family Medicine

## 2022-11-17 ENCOUNTER — Telehealth: Payer: Self-pay | Admitting: Family Medicine

## 2022-11-17 ENCOUNTER — Other Ambulatory Visit: Payer: Self-pay | Admitting: Family Medicine

## 2022-11-17 NOTE — Telephone Encounter (Signed)
Vaginal Bleeding  With mild lower abdomen discomfort slight bleeding for last few days No fever or GI bleeding or signifcant pain or lightheadness or other bleeding Can't come intomorrow  Will see Wednesday If worsening should go to ER She agrees

## 2022-11-18 NOTE — Progress Notes (Unsigned)
    SUBJECTIVE:   CHIEF COMPLAINT / HPI:   Vaginal Bleeding  With mild lower abdomen discomfort slight bleeding for last few days.  None yesterday.  Feels like may have had some pus when bleeding first started. No fever or GI bleeding or signifcant pain or lightheadness or other bleeding  Hypertension Has been taking her medications regularly.  Has a cuff at home    OBJECTIVE:   BP 136/82   Pulse 95   Wt 194 lb (88 kg)   LMP 12/01/2001   SpO2 96%   BMI 32.79 kg/m   Pelvic - normal external genitalia, no lesion or sores Vaginal mucosa appears normal with slight clear discharge.  No bleeding or lesions noted.   Cervix not seen well due to discomfort Rectal - no external anal lesions seen   ASSESSMENT/PLAN:   Vaginal bleeding Assessment & Plan: Not evident on exam.  Unsure of cause.  No signs of systemic bleeding problem.  Will check cbc looking for anemia and pelvic US.    Orders: -     CBC -     US PELVIC COMPLETE WITH TRANSVAGINAL; Future  Sciatica associated with disorder of lumbar spine -     traMADol HCl; Take 1 tablet (50 mg total) by mouth every 12 (twelve) hours as needed.  Dispense: 30 tablet; Refill: 0  Essential hypertension Assessment & Plan: Labile.  Will have her monitor at home and bring in readings       Patient Instructions  Good to see you today - Thank you for coming in  Things we discussed today:  Vaginal Bleeding - will order a blood test and a pelvic ultrasound to see if we can determine where the bleeding came from  If you have a lot of bleeding or pelvic pain let me know right away    Please always bring your medication bottles  Come back to see me in 1 month to check on blood pressure    [name], MD [place]

## 2022-11-19 ENCOUNTER — Other Ambulatory Visit: Payer: Self-pay

## 2022-11-19 ENCOUNTER — Ambulatory Visit (INDEPENDENT_AMBULATORY_CARE_PROVIDER_SITE_OTHER): Payer: Medicare HMO | Admitting: Family Medicine

## 2022-11-19 ENCOUNTER — Encounter: Payer: Self-pay | Admitting: Family Medicine

## 2022-11-19 VITALS — BP 136/82 | HR 95 | Wt 194.0 lb

## 2022-11-19 DIAGNOSIS — I10 Essential (primary) hypertension: Secondary | ICD-10-CM

## 2022-11-19 DIAGNOSIS — M5386 Other specified dorsopathies, lumbar region: Secondary | ICD-10-CM | POA: Diagnosis not present

## 2022-11-19 DIAGNOSIS — N939 Abnormal uterine and vaginal bleeding, unspecified: Secondary | ICD-10-CM

## 2022-11-19 MED ORDER — TRAMADOL HCL 50 MG PO TABS: 50.0000 mg | ORAL_TABLET | Freq: Two times a day (BID) | ORAL | 0 refills | Status: DC | PRN

## 2022-11-19 NOTE — Assessment & Plan Note (Addendum)
Not evident on exam.  Unsure of cause.  No signs of systemic bleeding problem.  Will check cbc looking for anemia and pelvic US.

## 2022-11-19 NOTE — Assessment & Plan Note (Signed)
Labile.  Will have her monitor at home and bring in readings

## 2022-11-19 NOTE — Patient Instructions (Signed)
Good to see you today - Thank you for coming in  Things we discussed today:  Vaginal Bleeding - will order a blood test and a pelvic ultrasound to see if we can determine where the bleeding came from  If you have a lot of bleeding or pelvic pain let me know right away    Please always bring your medication bottles  Come back to see me in 1 month to check on blood pressure

## 2022-11-20 ENCOUNTER — Ambulatory Visit (HOSPITAL_COMMUNITY)
Admission: RE | Admit: 2022-11-20 | Discharge: 2022-11-20 | Disposition: A | Discharge status: Home / Self Care | Payer: Medicare HMO | Source: Ambulatory Visit | Attending: Family Medicine | Admitting: Family Medicine

## 2022-11-20 DIAGNOSIS — N939 Abnormal uterine and vaginal bleeding, unspecified: Secondary | ICD-10-CM | POA: Diagnosis not present

## 2022-11-20 DIAGNOSIS — D259 Leiomyoma of uterus, unspecified: Secondary | ICD-10-CM | POA: Diagnosis not present

## 2022-11-20 DIAGNOSIS — N950 Postmenopausal bleeding: Secondary | ICD-10-CM | POA: Diagnosis not present

## 2022-11-20 LAB — CBC
Hematocrit: 34.3 % (ref 34.0–46.6)
Hemoglobin: 11.9 g/dL (ref 11.1–15.9)
MCH: 30.6 pg (ref 26.6–33.0)
MCHC: 34.7 g/dL (ref 31.5–35.7)
MCV: 88 fL (ref 79–97)
Platelets: 267 10*3/uL (ref 150–450)
RBC: 3.89 x10E6/uL (ref 3.77–5.28)
RDW: 12.1 % (ref 11.7–15.4)
WBC: 8.2 10*3/uL (ref 3.4–10.8)

## 2022-11-21 ENCOUNTER — Telehealth: Payer: Self-pay | Admitting: Family Medicine

## 2022-11-21 NOTE — Telephone Encounter (Signed)
Dicussed the results with her  Recommend to watch for any further bleeding or pain or discharge and if worsening or not improved in a week to let me know

## 2022-12-02 ENCOUNTER — Other Ambulatory Visit: Payer: Self-pay | Admitting: Family Medicine

## 2022-12-07 ENCOUNTER — Other Ambulatory Visit: Payer: Self-pay | Admitting: Family Medicine

## 2022-12-17 ENCOUNTER — Encounter: Payer: Self-pay | Admitting: Family Medicine

## 2022-12-17 ENCOUNTER — Other Ambulatory Visit: Payer: Self-pay

## 2022-12-17 ENCOUNTER — Ambulatory Visit (INDEPENDENT_AMBULATORY_CARE_PROVIDER_SITE_OTHER): Payer: Medicare HMO | Admitting: Family Medicine

## 2022-12-17 VITALS — BP 154/104 | HR 79 | Ht 64.5 in | Wt 194.8 lb

## 2022-12-17 DIAGNOSIS — N939 Abnormal uterine and vaginal bleeding, unspecified: Secondary | ICD-10-CM | POA: Diagnosis not present

## 2022-12-17 DIAGNOSIS — G4709 Other insomnia: Secondary | ICD-10-CM | POA: Diagnosis not present

## 2022-12-17 MED ORDER — TRAZODONE HCL 50 MG PO TABS: 100.0000 mg | ORAL_TABLET | Freq: Every evening | ORAL | 0 refills | Status: DC | PRN

## 2022-12-17 NOTE — Progress Notes (Signed)
    SUBJECTIVE:   CHIEF COMPLAINT / HPI:   Insomnia  Not sleeping well for weeks.   Goes to bed at variable times  Has tried herbal tea and melatonin.  Feels her mood is still depressed and grieving her late husband.  Taking sertraline daily and hydroxyzine as needed.  No suicidal ideation.  Not resumed alcohol but has been tempted.  Can talk with her next door neighbor Chrissy sp?   Has seen psychiatry in past and is reluctantly willing to see again    Vaginal Bleeding  Continues to have mild intermittent bleeding.   Nothing heavy and no lightlessness.  Would like to have a hysterectomy.  US showed thin stripe and fibroids     OBJECTIVE:   BP (!) 154/104   Pulse 79   Ht 5' 4.5" (1.638 m)   Wt 194 lb 12.8 oz (88.4 kg)   LMP 12/01/2001   SpO2 98%   BMI 32.92 kg/m   Psych:  Cognition and judgment appear intact. Alert, communicative  and cooperative with normal attention span and concentration. No apparent delusions, illusions, hallucinations Ambulating without symptoms   ASSESSMENT/PLAN:   Vaginal bleeding Assessment & Plan: Continuing a low level.  Given finding of fibroids and wish for hysterectomy will refer to GYN.   No symptoms of significant anemia.  Recent CBC was normal.    Orders: -     Ambulatory referral to Gynecology  Other insomnia Assessment & Plan: I think there is a significant component of complex depression, grief, chronic anxiety and history of substance use with her insomnia.  She agrees to see psychiatry.  In meantime will try higher dose trazadone 100 mg at night.  Not to take with any other sedating medications   Orders: -     traZODone HCl; Take 2 tablets (100 mg total) by mouth at bedtime as needed for sleep.  Dispense: 60 tablet; Refill: 0     Patient Instructions  Good to see you today - Thank you for coming in  Things we discussed today:  Insomnia Go to bed at 11- 7 No matter how much sleep you get No naps Take trazadone 100 mg 30  minutes before bed time  Call me an let me know how the trazadone is doing and when you have an appointment with psychiatry  Call [facility] or look at your insurance book for psychiatrist and call and schedule an appointment  Vaginal Bleeding I have put in a referral to Gynecology.  They should call you to schedule an appointment.  This could appear as an unknown number on your phone.  If you have not heard from them in 2 weeks please let me know.    Please always bring your medication bottles  Come back to see me in 2 weeks    [name], MD [place]

## 2022-12-17 NOTE — Patient Instructions (Signed)
Good to see you today - Thank you for coming in  Things we discussed today:  Insomnia Go to bed at 11- 7 No matter how much sleep you get No naps Take trazadone 100 mg 30 minutes before bed time  Call me an let me know how the trazadone is doing and when you have an appointment with psychiatry  Call [facility] or look at your insurance book for psychiatrist and call and schedule an appointment  Vaginal Bleeding I have put in a referral to Gynecology.  They should call you to schedule an appointment.  This could appear as an unknown number on your phone.  If you have not heard from them in 2 weeks please let me know.    Please always bring your medication bottles  Come back to see me in 2 weeks

## 2022-12-17 NOTE — Assessment & Plan Note (Signed)
Continuing a low level.  Given finding of fibroids and wish for hysterectomy will refer to GYN.   No symptoms of significant anemia.  Recent CBC was normal.

## 2022-12-17 NOTE — Assessment & Plan Note (Signed)
I think there is a significant component of complex depression, grief, chronic anxiety and history of substance use with her insomnia.  She agrees to see psychiatry.  In meantime will try higher dose trazadone 100 mg at night.  Not to take with any other sedating medications

## 2022-12-25 ENCOUNTER — Other Ambulatory Visit: Payer: Self-pay | Admitting: Family Medicine

## 2022-12-29 ENCOUNTER — Other Ambulatory Visit: Payer: Self-pay | Admitting: Family Medicine

## 2022-12-30 ENCOUNTER — Encounter: Payer: Self-pay | Admitting: Family Medicine

## 2022-12-30 ENCOUNTER — Other Ambulatory Visit: Payer: Self-pay

## 2022-12-30 ENCOUNTER — Ambulatory Visit (INDEPENDENT_AMBULATORY_CARE_PROVIDER_SITE_OTHER): Payer: Medicare HMO | Admitting: Family Medicine

## 2022-12-30 VITALS — BP 141/84 | HR 82 | Ht 64.5 in | Wt 176.8 lb

## 2022-12-30 DIAGNOSIS — F39 Unspecified mood [affective] disorder: Secondary | ICD-10-CM | POA: Diagnosis not present

## 2022-12-30 DIAGNOSIS — G4700 Insomnia, unspecified: Secondary | ICD-10-CM | POA: Diagnosis not present

## 2022-12-30 DIAGNOSIS — N939 Abnormal uterine and vaginal bleeding, unspecified: Secondary | ICD-10-CM

## 2022-12-30 MED ORDER — QUETIAPINE FUMARATE 50 MG PO TABS: ORAL_TABLET | ORAL | 1 refills | Status: DC

## 2022-12-30 NOTE — Assessment & Plan Note (Signed)
Stable without symptoms of anemia.  Has appointment with Gyn on 3/20

## 2022-12-30 NOTE — Assessment & Plan Note (Signed)
I think her anxiety and insomnia are related to depression/grief reaction.  Will increase sertraline and start seroquel at 50 mg at night for sleep.  Continue to follow up with [facility].  She has a good family and neighbor support system

## 2022-12-30 NOTE — Progress Notes (Signed)
    SUBJECTIVE:   CHIEF COMPLAINT / HPI:   Vaginal bleeding Continues mildly almost every day.  No lightheadness or exertional symptoms or edema Has appointment 3/20 with GYN  Other insomnia Trazadone did not help.  Still takinig sertraline 50 mg daily.  Not taking tramadol or gabapentin or tizanadine Had virtual intake visit with [facility].   Plans to follow up with them No suicidal ideation or HI.  No drinking alcohol   OBJECTIVE:   BP (!) 141/84   Pulse 82   Ht 5' 4.5" (1.638 m)   Wt 176 lb 12.8 oz (80.2 kg)   LMP 12/01/2001   SpO2 99%   BMI 29.88 kg/m   Psych:  Cognition and judgment appear intact. Alert, communicative  and cooperative with normal attention span and concentration. No apparent delusions, illusions, hallucinations.  Becomes tearful but recovers.   Heart - Regular rate and rhythm.  No murmurs, gallops or rubs.    Lungs:  Normal respiratory effort, chest expands symmetrically. Lungs are clear to auscultation, no crackles or wheezes. Extremities:  No cyanosis, edema, or deformity noted with good range of motion of all major joints.      ASSESSMENT/PLAN:   Mood disorder [facility]) Assessment & Plan: I think her anxiety and insomnia are related to depression/grief reaction.  Will increase sertraline and start seroquel at 50 mg at night for sleep.  Continue to follow up with [facility].  She has a good family and neighbor support system    Insomnia, unspecified type Assessment & Plan: I think her anxiety and insomnia are related to depression/grief reaction.  Will increase sertraline and start seroquel at 50 mg at night for sleep.  Continue to follow up with [facility].  She has a good family and neighbor support system   Orders: -     QUEtiapine Fumarate; Take one tablet before bed if not effective take 2 tabs before bed  Dispense: 60 tablet; Refill: 1  Vaginal bleeding Assessment & Plan: Stable without symptoms of anemia.  Has appointment with Gyn on 3/20        Patient Instructions  Good to see you today - Thank you for coming in  Things we discussed today:  Mood Sleep Take Seroquel 1-2 tabs at night for sleep. Call me on Friday to let me know how its going Increase the sertaline to 3 tabs at night Do not take tramadol or trazadone or gabapentin Follow up with [facility]  Let me know if the bleeding gets worse  or you feeling worse    Please always bring your medication bottles  Come back to see me in 1 month    [name], [place]

## 2022-12-30 NOTE — Patient Instructions (Signed)
Good to see you today - Thank you for coming in  Things we discussed today:  Mood Sleep Take Seroquel 1-2 tabs at night for sleep. Call me on Friday to let me know how its going Increase the sertaline to 3 tabs at night Do not take tramadol or trazadone or gabapentin Follow up with [facility]  Let me know if the bleeding gets worse  or you feeling worse    Please always bring your medication bottles  Come back to see me in 1 month

## 2023-01-02 ENCOUNTER — Telehealth: Payer: Self-pay | Admitting: Family Medicine

## 2023-01-02 NOTE — Telephone Encounter (Signed)
Feels better with the sertraline 75 mg but is not sleeping with 2 seroquel  Told to go up to 3 tabs of seroquel around 10 PM  She agrees

## 2023-01-10 ENCOUNTER — Other Ambulatory Visit: Payer: Self-pay | Admitting: Family Medicine

## 2023-01-19 ENCOUNTER — Other Ambulatory Visit: Payer: Self-pay | Admitting: Family Medicine

## 2023-01-21 ENCOUNTER — Other Ambulatory Visit: Payer: Self-pay | Admitting: Family Medicine

## 2023-01-21 DIAGNOSIS — G4700 Insomnia, unspecified: Secondary | ICD-10-CM

## 2023-01-22 ENCOUNTER — Other Ambulatory Visit: Payer: Self-pay | Admitting: Family Medicine

## 2023-01-22 DIAGNOSIS — G4700 Insomnia, unspecified: Secondary | ICD-10-CM

## 2023-01-22 MED ORDER — QUETIAPINE FUMARATE 50 MG PO TABS: ORAL_TABLET | ORAL | 1 refills | Status: DC

## 2023-01-28 ENCOUNTER — Telehealth: Payer: Self-pay | Admitting: Family Medicine

## 2023-01-28 ENCOUNTER — Other Ambulatory Visit (HOSPITAL_COMMUNITY)
Admission: RE | Admit: 2023-01-28 | Discharge: 2023-01-28 | Disposition: A | Discharge status: Home / Self Care | Payer: Medicare HMO | Source: Ambulatory Visit | Attending: Obstetrics and Gynecology | Admitting: Obstetrics and Gynecology

## 2023-01-28 ENCOUNTER — Encounter: Payer: Self-pay | Admitting: Obstetrics and Gynecology

## 2023-01-28 ENCOUNTER — Ambulatory Visit (INDEPENDENT_AMBULATORY_CARE_PROVIDER_SITE_OTHER): Payer: Medicare HMO | Admitting: Obstetrics and Gynecology

## 2023-01-28 VITALS — BP 134/80 | HR 88 | Ht 64.5 in | Wt 194.0 lb

## 2023-01-28 DIAGNOSIS — N858 Other specified noninflammatory disorders of uterus: Secondary | ICD-10-CM | POA: Diagnosis not present

## 2023-01-28 DIAGNOSIS — D259 Leiomyoma of uterus, unspecified: Secondary | ICD-10-CM | POA: Diagnosis not present

## 2023-01-28 DIAGNOSIS — Z124 Encounter for screening for malignant neoplasm of cervix: Secondary | ICD-10-CM | POA: Diagnosis not present

## 2023-01-28 DIAGNOSIS — N950 Postmenopausal bleeding: Secondary | ICD-10-CM | POA: Insufficient documentation

## 2023-01-28 NOTE — Progress Notes (Signed)
72 y.o. G2P2 Widowed Black or [place] American Not Hispanic or Latino female here for evaluation of Vaginal Bleeding.  She has been bleeding off and on since 1/24, varies from light to heavy. She can saturate a pad in up to 4 hours at times. Having intermittent pelvic cramping.   I reviewed her pelvic ultrasound images and report from 11/20/22. She had a normal sized uterus with multiple small fibroids. Endometrial stripe was 3 mm, partially obscured by fibroids. Ovaries were not seen.   Reviewed note from Primary Care.   She lost her husband of 50 years in 6/23.     Patient's last menstrual period was 12/01/2001.          Sexually active: No.  The current method of family planning is post menopausal status.    Exercising: Yes.     Walking and yard work  Smoker:  yes  Health Maintenance: Pap:  pap10/26/16 WNL HR HPV neg  History of abnormal Pap:  no MMG:  07/29/21 Bi-rads 1 neg  BMD:   11/08/21 osteopenic Colonoscopy: 08/15/21 F/u 3 years TDaP:  2015 Gardasil: none   reports that she has been smoking cigarettes. She has been smoking an average of .25 packs per day. She has never used smokeless tobacco. She reports that she does not currently use alcohol. She reports that she does not use drugs. 2 daughters, 3 grandchildren (1 deceased), 6 great grandchildren (2 sets of twins).  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   History of rectal fissure    Hypertension    Normal cardiac stress test 11/10/2006   [facility]   Normal echocardiogram 08/19/2007    Past Surgical History:  Procedure Laterality Date   ANAL FISSURE REPAIR     CARPAL TUNNEL RELEASE Bilateral    DILATION AND  CURETTAGE OF UTERUS     ectopic pregnancy   ROTATOR CUFF REPAIR Right    x 2   TUBAL LIGATION      Current Outpatient Medications  Medication Sig Dispense Refill   acetaminophen (TYLENOL) 650 MG CR tablet Take 650 mg by mouth every 8 (eight) hours as needed for pain.     amLODipine (NORVASC) 10 MG tablet TAKE 1 TABLET BY MOUTH EVERY DAY 90 tablet 2   atorvastatin (LIPITOR) 40 MG tablet TAKE 1 TABLET BY MOUTH EVERY DAY 90 tablet 3   Cholecalciferol (VITAMIN D-3) 1000 UNITS CAPS Take 1 capsule (1,000 Units total) by mouth daily. 30 capsule 6   hydrOXYzine (ATARAX) 25 MG tablet TAKE 1 TABLET BY MOUTH THREE TIMES A DAY AS NEEDED FOR ANXIETY 60 tablet 1   omeprazole (PRILOSEC) 20 MG capsule Take 1 capsule (20 mg total) by mouth 2 (two) times daily before a meal for 14 days. (Patient taking differently: Take 20 mg by mouth daily as needed (for acid reflux).) 28 capsule 0   QUEtiapine (SEROQUEL) 50 MG tablet Take three tabs before bed 90 tablet 1   sertraline (ZOLOFT) 25 MG tablet Take 3 tablets (75 mg total) by mouth daily. 180 tablet 0  triamterene-hydrochlorothiazide (MAXZIDE-25) 37.5-25 MG tablet TAKE 1 TABLET BY MOUTH EVERY DAY 90 tablet 1   vitamin B-12 (CYANOCOBALAMIN) 500 MCG tablet Take 500 mcg by mouth daily.     No current facility-administered medications for this visit.    Family History  Problem Relation Age of Onset   Breast cancer Mother        died age 55   Colon cancer Mother    Arthritis Mother    Alzheimer's disease Mother    Coronary artery disease Father        died age 38   Stomach cancer Father    Arthritis Father    Stroke Sister    Heart failure Sister    Hypertension Sister    Arthritis Maternal Grandmother    Arthritis Paternal Grandmother    Breast cancer Paternal Grandmother    Alzheimer's disease Maternal Aunt        x5   Breast cancer Other        PGA    Review of Systems  Genitourinary:  Positive for pelvic pain and vaginal bleeding.     Exam:   BP 134/80   Pulse 88   Ht 5' 4.5" (1.638 m)   Wt 194 lb (88 kg)   LMP 12/01/2001   SpO2 100%   BMI 32.79 kg/m   Weight change: @WEIGHTCHANGE @ Height:   Height: 5' 4.5" (163.8 cm)  Ht Readings from Last 3 Encounters:  01/28/23 5' 4.5" (1.638 m)  12/30/22 5' 4.5" (1.638 m)  12/17/22 5' 4.5" (1.638 m)    General appearance: alert, cooperative and appears stated age Abdomen: soft, non-tender; non distended,  no masses,  no organomegaly  Pelvic: External genitalia:  no lesions              Urethra:  normal appearing urethra with no masses, tenderness or lesions              Bartholins and Skenes: normal                 Vagina: normal appearing vagina with normal color and discharge, no lesions              Cervix: no lesions               Bimanual Exam:  Uterus:   anteverted, mobile, not appreciably enlarged, mildly tender              Adnexa:  no masses, bilaterally tender                The risks of endometrial biopsy were reviewed and a consent was obtained.  A speculum was placed in the vagina and the cervix was cleansed with betadine. A tenaculum was placed on the cervix, the pipelle could not pass the internal os. The cervix was dilated with the os finder and the pipelle was placed into the endometrial cavity. The uterus sounded to ~7 cm. The endometrial biopsy was performed, taking care to get a representative sample, sampling 360 degrees of the uterine cavity. A small amount of blood/tissue was obtained. The tenaculum and speculum were removed. There were no complications.   Gae Dry, CMA chaperoned for the exam.  1. Postmenopausal bleeding Thin endometrium, but exam limited by fibroids. - Surgical pathology( [facility]/ [facility]) - Cytology - PAP -Depending on results may have her return for a sonohysterogram  2. Screening for cervical cancer - Cytology - PAP  3. Uterine leiomyoma, unspecified location Small, calcified fibroids,  distort the  endometrium.

## 2023-01-28 NOTE — Patient Instructions (Signed)

## 2023-01-28 NOTE — Telephone Encounter (Signed)
Contacted Joyanna C Kloster to schedule their annual wellness visit. Appointment made for 02/03/2023.  Thank you,  [place] Direct dial  [phone]

## 2023-01-30 LAB — CYTOLOGY - PAP: Diagnosis: NEGATIVE

## 2023-01-30 LAB — SURGICAL PATHOLOGY

## 2023-02-02 NOTE — Progress Notes (Unsigned)
I connected with  Latoya Swanson on 02/03/2023 by a audio enabled telemedicine application and verified that I am speaking with the correct person using two identifiers.  Patient Location: Home  Provider Location: Home Office  I discussed the limitations of evaluation and management by telemedicine. The patient expressed understanding and agreed to proceed.  Subjective:   Latoya Swanson is a 72 y.o. female who presents for an Initial Medicare Annual Wellness Visit.  Review of Systems    Per HPI unless specifically indicated below. Cardiac Risk Factors include: advanced age (>60men, >7 women);female gender, Essential Hypertension, and Hyperlipidemia.          Objective:       01/28/2023    2:38 PM 12/30/2022   10:18 AM 12/17/2022    9:38 AM  Vitals with BMI  Height 5' 4.5" 5' 4.5" 5' 4.5"  Weight 194 lbs 176 lbs 13 oz 194 lbs 13 oz  BMI 32.8 [id number] [id number]  Systolic Q000111Q Q000111Q 123456  Diastolic 80 84 123456  Pulse 88 82 79    There were no vitals filed for this visit. There is no height or weight on file to calculate BMI.     12/30/2022   10:21 AM 12/17/2022    9:40 AM 11/19/2022    8:57 AM 09/02/2022   10:19 AM 07/02/2022   10:18 AM 05/17/2022    5:41 PM 05/17/2022    2:10 AM  Advanced Directives  Does Patient Have a Medical Advance Directive? No No No No No No No  Would patient like information on creating a medical advance directive? No - Patient declined No - Patient declined No - Patient declined  No - Patient declined No - Patient declined     Current Medications (verified) Outpatient Encounter Medications as of 02/03/2023  Medication Sig   acetaminophen (TYLENOL) 650 MG CR tablet Take 650 mg by mouth every 8 (eight) hours as needed for pain.   amLODipine (NORVASC) 10 MG tablet TAKE 1 TABLET BY MOUTH EVERY DAY   atorvastatin (LIPITOR) 40 MG tablet TAKE 1 TABLET BY MOUTH EVERY DAY   Cholecalciferol (VITAMIN D-3) 1000 UNITS CAPS Take 1 capsule (1,000 Units total) by mouth daily.    hydrOXYzine (ATARAX) 25 MG tablet TAKE 1 TABLET BY MOUTH THREE TIMES A DAY AS NEEDED FOR ANXIETY   omeprazole (PRILOSEC) 20 MG capsule Take 1 capsule (20 mg total) by mouth 2 (two) times daily before a meal for 14 days. (Patient taking differently: Take 20 mg by mouth daily as needed (for acid reflux).)   QUEtiapine (SEROQUEL) 50 MG tablet Take three tabs before bed   sertraline (ZOLOFT) 25 MG tablet Take 3 tablets (75 mg total) by mouth daily.   triamterene-hydrochlorothiazide (MAXZIDE-25) 37.5-25 MG tablet TAKE 1 TABLET BY MOUTH EVERY DAY   vitamin B-12 (CYANOCOBALAMIN) 500 MCG tablet Take 500 mcg by mouth daily.   No facility-administered encounter medications on file as of 02/03/2023.    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   History  of rectal fissure    Hypertension    Normal cardiac stress test 11/10/2006   [facility]   Normal echocardiogram 08/19/2007   Past Surgical History:  Procedure Laterality Date   ANAL FISSURE REPAIR     CARPAL TUNNEL RELEASE Bilateral    DILATION AND CURETTAGE OF UTERUS     laparotomy salpingectomy     ectopic pregnancy   ROTATOR CUFF REPAIR Right    x 2   TUBAL LIGATION     Family History  Problem Relation Age of Onset   Breast cancer Mother        died age 49   Colon cancer Mother    Arthritis Mother    Alzheimer's disease Mother    Coronary artery disease Father        died age 71   Stomach cancer Father    Arthritis Father    Stroke Sister    Heart failure Sister    Hypertension Sister    Arthritis Maternal Grandmother    Arthritis Paternal Grandmother    Breast cancer Paternal  Grandmother    Alzheimer's disease Maternal Aunt        x5   Breast cancer Other        PGA   Social History   Socioeconomic History   Marital status: Widowed    Spouse name: Claudie Fisherman    Number of children: 2   Years of education: 15   Highest education level: Not on file  Occupational History   Occupation: DISABLED    Employer: UNEMPLOYED  Tobacco Use   Smoking status: Every Day    Packs/day: .25    Types: Cigarettes   Smokeless tobacco: Never   Tobacco comments:    2-3 cigs a day  Vaping Use   Vaping Use: Never used  Substance and Sexual Activity   Alcohol use: Not Currently    Comment: glass of wine occasionally   Drug use: No   Sexual activity: Yes    Birth control/protection: Post-menopausal  Other Topics Concern   Not on file  Social History Narrative   Lives with her husband and her Chow dog.    Social Determinants of Health   Financial Resource Strain: Not on file  Food Insecurity: Not on file  Transportation Needs: Not on file  Physical Activity: Not on file  Stress: Not on file  Social Connections: Not on file    Tobacco Counseling Ready to quit: Not Answered Counseling given: Not Answered Tobacco comments: 2-3 cigs a day   Clinical Intake:                 Diabetic?no         Activities of Daily Living    05/17/2022    5:51 PM 05/17/2022    5:47 PM  In your present state of health, do you have any difficulty performing the following activities:  Hearing?  0  Vision?  0  Difficulty concentrating or making decisions?  0  Walking or climbing stairs?  0  Dressing or bathing?  0  Doing errands, shopping? 1     Patient Care Team: [name], MD as PCP - General  Indicate any recent Medical Services you may have received from other than [facility] providers in the past year (date may be approximate).     Assessment:   This is a routine wellness examination for [facility].  Hearing/Vision screen No results  found.  Dietary issues and exercise activities discussed:  Goals Addressed   None    Depression Screen    12/30/2022   10:22 AM 12/17/2022    9:40 AM 11/19/2022    8:57 AM 10/07/2022   11:21 AM 09/02/2022   10:19 AM 07/02/2022   10:18 AM 04/29/2022   11:02 AM  PHQ 2/9 Scores  PHQ - 2 Score 6   2 3 3 5   PHQ- 9 Score 17   7 11 6 19   Exception Documentation  Patient refusal Patient refusal        Fall Risk    12/30/2022   10:21 AM 12/17/2022    9:39 AM 11/19/2022    8:57 AM 10/07/2022   11:21 AM 09/02/2022   10:19 AM  Fall Risk   Falls in the past year? 0 0 0 0 1  Number falls in past yr: 0 0 0 0 0  Injury with Fall? 0 0 0 0     FALL RISK PREVENTION PERTAINING TO THE HOME:  Any stairs in or around the home? {YES/NO:21197} If so, are there any without handrails? {YES/NO:21197} Home free of loose throw rugs in walkways, pet beds, electrical cords, etc? {YES/NO:21197} Adequate lighting in your home to reduce risk of falls? {YES/NO:21197}  ASSISTIVE DEVICES UTILIZED TO PREVENT FALLS:  Life alert? {YES/NO:21197} Use of a cane, walker or w/c? {YES/NO:21197} Grab bars in the bathroom? {YES/NO:21197} Shower chair or bench in shower? {YES/NO:21197} Elevated toilet seat or a handicapped toilet? {YES/NO:21197}  TIMED UP AND GO:  Was the test performed? Unable to perform, virtual appointment   Cognitive Function:        Immunizations Immunization History  Administered Date(s) Administered   COVID-19, mRNA, vaccine(Comirnaty)12 years and older 10/07/2022   Fluad Quad(high Dose 65+) 09/02/2022   H1N1 11/07/2008   Influenza Split 08/18/2012   Influenza Whole 09/10/2009, 08/28/2010   Influenza, High Dose Seasonal PF 09/28/2018   Influenza,inj,Quad PF,6+ Mos 07/13/2014, 07/18/2015, 09/01/2016, 07/31/2017, 08/17/2019   PFIZER Comirnaty(Gray Top)Covid-19 Tri-Sucrose Vaccine 12/05/2020   PFIZER(Purple Top)SARS-COV-2 Vaccination 01/06/2020, 01/31/2020   Pneumococcal  Conjugate-13 07/31/2017   Pneumococcal Polysaccharide-23 11/07/2008, 09/28/2018   Td 11/07/2008   Zoster, Live 11/10/2014    TDAP status: Due, Education has been provided regarding the importance of this vaccine. Advised may receive this vaccine at local pharmacy or Health Dept. Aware to provide a copy of the vaccination record if obtained from local pharmacy or Health Dept. Verbalized acceptance and understanding.  Flu Vaccine status: Up to date  Pneumococcal vaccine status: Up to date  Covid-19 vaccine status: Information provided on how to obtain vaccines.   Qualifies for Shingles Vaccine? Yes   Zostavax completed No   Shingrix Completed?: No.    Education has been provided regarding the importance of this vaccine. Patient has been advised to call insurance company to determine out of pocket expense if they have not yet received this vaccine. Advised may also receive vaccine at local pharmacy or Health Dept. Verbalized acceptance and understanding.  Screening Tests Health Maintenance  Topic Date Due   Medicare Annual Wellness (AWV)  Never done   Zoster Vaccines- Shingrix (1 of 2) Never done   DTaP/Tdap/Td (2 - Tdap) 11/07/2018   MAMMOGRAM  07/30/2023   COLONOSCOPY (Pts 45-61yrs Insurance coverage will need to be confirmed)  08/15/2024   Pneumonia Vaccine 53+ Years old  Completed   INFLUENZA VACCINE  Completed   DEXA SCAN  Completed   COVID-19 Vaccine  Completed   Hepatitis C Screening  Completed   HPV  [place] Maintenance  Health Maintenance Due  Topic Date Due   Medicare Annual Wellness (AWV)  Never done   Zoster Vaccines- Shingrix (1 of 2) Never done   DTaP/Tdap/Td (2 - Tdap) 11/07/2018    Colorectal cancer screening: Type of screening: Colonoscopy. Completed 08/15/2021. Repeat every 3 years  Mammogram status: Completed 07/29/21. Repeat every year  DEXA Scan: completed 11/07/2021  Lung Cancer Screening: (Low Dose CT Chest recommended if Age  75-80 years, 30 pack-year currently smoking OR have quit w/in 15years.) does not qualify.   Lung Cancer Screening Referral: not applicable  Additional Screening:  Hepatitis C Screening: does qualify; Completed 05/15/2021  Vision Screening: Recommended annual ophthalmology exams for early detection of glaucoma and other disorders of the eye. Is the patient up to date with their annual eye exam?  Yes  Who is the provider or what is the name of the office in which the patient attends annual eye exams?  If pt is not established with a provider, would they like to be referred to a provider to establish care? No .   Dental Screening: Recommended annual dental exams for proper oral hygiene  Community Resource Referral / Chronic Care Management: CRR required this visit?  No   CCM required this visit?  No      Plan:     I have personally reviewed and noted the following in the patient's chart:   Medical and social history Use of alcohol, tobacco or illicit drugs  Current medications and supplements including opioid prescriptions. Patient is not currently taking opioid prescriptions. Functional ability and status Nutritional status Physical activity Advanced directives List of other physicians Hospitalizations, surgeries, and ER visits in previous 12 months Vitals Screenings to include cognitive, depression, and falls Referrals and appointments  In addition, I have reviewed and discussed with patient certain preventive protocols, quality metrics, and best practice recommendations. A written personalized care plan for preventive services as well as general preventive health recommendations were provided to patient.    Ms. Krolick , Thank you for taking time to come for your Medicare Wellness Visit. I appreciate your ongoing commitment to your health goals. Please review the following plan we discussed and let me know if I can assist you in the future.   These are the goals we  discussed:  Goals   None     This is a list of the screening recommended for you and due dates:  Health Maintenance  Topic Date Due   Zoster (Shingles) Vaccine (1 of 2) Never done   DTaP/Tdap/Td vaccine (2 - Tdap) 11/07/2018   Mammogram  07/30/2023   Medicare Annual Wellness Visit  02/03/2024   Colon Cancer Screening  08/15/2024   Pneumonia Vaccine  Completed   Flu Shot  Completed   DEXA scan (bone density measurement)  Completed   COVID-19 Vaccine  Completed   Hepatitis C Screening: USPSTF Recommendation to screen - Ages 44-79 yo.  Completed   HPV Vaccine  Aged [address] [place]   02/03/2023  Nurse Notes: Approximately 30 minute Non-Face -To-Face Medicare Wellness Visit

## 2023-02-02 NOTE — Patient Instructions (Signed)

## 2023-02-03 ENCOUNTER — Other Ambulatory Visit: Payer: Self-pay

## 2023-02-03 ENCOUNTER — Ambulatory Visit (INDEPENDENT_AMBULATORY_CARE_PROVIDER_SITE_OTHER): Payer: Medicare HMO

## 2023-02-03 DIAGNOSIS — N950 Postmenopausal bleeding: Secondary | ICD-10-CM

## 2023-02-03 DIAGNOSIS — Z0000 Encounter for general adult medical examination without abnormal findings: Secondary | ICD-10-CM

## 2023-02-13 ENCOUNTER — Other Ambulatory Visit: Payer: Self-pay | Admitting: Family Medicine

## 2023-02-13 DIAGNOSIS — G4700 Insomnia, unspecified: Secondary | ICD-10-CM

## 2023-02-26 ENCOUNTER — Other Ambulatory Visit: Payer: Self-pay | Admitting: Family Medicine

## 2023-02-26 DIAGNOSIS — M5386 Other specified dorsopathies, lumbar region: Secondary | ICD-10-CM

## 2023-02-27 DIAGNOSIS — F330 Major depressive disorder, recurrent, mild: Secondary | ICD-10-CM | POA: Diagnosis not present

## 2023-03-05 ENCOUNTER — Other Ambulatory Visit: Payer: Self-pay | Admitting: Family Medicine

## 2023-03-17 DIAGNOSIS — F330 Major depressive disorder, recurrent, mild: Secondary | ICD-10-CM | POA: Diagnosis not present

## 2023-03-27 ENCOUNTER — Other Ambulatory Visit: Payer: Self-pay | Admitting: Family Medicine

## 2023-03-27 DIAGNOSIS — F330 Major depressive disorder, recurrent, mild: Secondary | ICD-10-CM | POA: Diagnosis not present

## 2023-03-27 DIAGNOSIS — M5386 Other specified dorsopathies, lumbar region: Secondary | ICD-10-CM

## 2023-03-27 MED ORDER — TRAMADOL HCL 50 MG PO TABS: 50.0000 mg | ORAL_TABLET | Freq: Two times a day (BID) | ORAL | 0 refills | Status: DC | PRN

## 2023-03-28 ENCOUNTER — Other Ambulatory Visit: Payer: Self-pay | Admitting: Family Medicine

## 2023-03-31 ENCOUNTER — Ambulatory Visit (INDEPENDENT_AMBULATORY_CARE_PROVIDER_SITE_OTHER): Payer: Medicare HMO | Admitting: Obstetrics and Gynecology

## 2023-03-31 ENCOUNTER — Ambulatory Visit (INDEPENDENT_AMBULATORY_CARE_PROVIDER_SITE_OTHER): Payer: Medicare HMO

## 2023-03-31 ENCOUNTER — Encounter: Payer: Self-pay | Admitting: Obstetrics and Gynecology

## 2023-03-31 VITALS — BP 134/84 | HR 96 | Wt 198.0 lb

## 2023-03-31 DIAGNOSIS — N950 Postmenopausal bleeding: Secondary | ICD-10-CM

## 2023-03-31 DIAGNOSIS — N840 Polyp of corpus uteri: Secondary | ICD-10-CM

## 2023-03-31 NOTE — Progress Notes (Signed)
GYNECOLOGY  VISIT   HPI: 72 y.o.   Widowed Black or [place] American Not Hispanic or Latino  female   [phone] with Patient's last menstrual period was 12/01/2001.   here for further evaluation of PMP bleeding.   U/S 11/20/22:  normal sized uterus with multiple small fibroids. Endometrial stripe was 3 mm, partially obscured by fibroids. Ovaries were not seen   01/28/23 pap: normal  01/28/23 Endometrial biopsy: benign inactive endometrium  GYNECOLOGIC HISTORY: Patient's last menstrual period was 12/01/2001. Contraception:PMP Menopausal hormone therapy: no        OB History     Gravida  2   Para  2   Term  1   Preterm  1   AB      Living  3      SAB      IAB      Ectopic      Multiple  1   Live Births  3              Patient Active Problem List   Diagnosis Date Noted   Vaginal bleeding 11/19/2022   GI bleeding 05/17/2022   Alcoholic cirrhosis (HCC) 05/17/2022   Alcohol abuse 05/17/2022   HLD (hyperlipidemia) 05/17/2022   Rectal bleeding    Smokers' cough (HCC) 11/19/2021   Sciatica associated with disorder of lumbar spine 09/01/2016   Abnormal gall bladder diagnostic imaging 10/24/2015   Right hip pain 09/19/2015   Hepatic cirrhosis (HCC) 07/18/2015   Tobacco use 03/03/2012   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   History of rectal fissure    Hypertension    Normal cardiac stress test 11/10/2006   Donnie Aho   Normal  echocardiogram 08/19/2007    Past Surgical History:  Procedure Laterality Date   ANAL FISSURE REPAIR     CARPAL TUNNEL RELEASE Bilateral    DILATION AND CURETTAGE OF UTERUS     laparotomy salpingectomy     ectopic pregnancy   ROTATOR CUFF REPAIR Right    x 2   TUBAL LIGATION      Current Outpatient Medications  Medication Sig Dispense Refill   acetaminophen (TYLENOL) 650 MG CR tablet Take 650 mg by mouth every 8 (eight) hours as needed for pain.     amLODipine (NORVASC) 10 MG tablet TAKE 1 TABLET BY MOUTH EVERY DAY 90 tablet 2   atorvastatin (LIPITOR) 40 MG tablet TAKE 1 TABLET BY MOUTH EVERY DAY 90 tablet 3   Cholecalciferol (VITAMIN D-3) 1000 UNITS CAPS Take 1 capsule (1,000 Units total) by mouth daily. 30 capsule 6   hydrOXYzine (ATARAX) 25 MG tablet TAKE 1 TABLET BY MOUTH THREE TIMES A DAY AS NEEDED FOR ANXIETY 60 tablet 0   omeprazole (PRILOSEC) 20 MG capsule Take 1 capsule (20 mg total) by mouth 2 (two) times daily before a meal  for 14 days. (Patient taking differently: Take 20 mg by mouth daily as needed (for acid reflux).) 28 capsule 0   QUEtiapine (SEROQUEL) 50 MG tablet TAKE 3 TABS BY MOUTH BEFORE BED 270 tablet 0   sertraline (ZOLOFT) 25 MG tablet TAKE 2 TABLETS BY MOUTH EVERY DAY 60 tablet 0   traMADol (ULTRAM) 50 MG tablet Take 1 tablet (50 mg total) by mouth every 12 (twelve) hours as needed. 30 tablet 0   triamterene-hydrochlorothiazide (MAXZIDE-25) 37.5-25 MG tablet TAKE 1 TABLET BY MOUTH EVERY DAY 90 tablet 1   vitamin B-12 (CYANOCOBALAMIN) 500 MCG tablet Take 500 mcg by mouth daily.     No current facility-administered medications for this visit.     ALLERGIES: Aspirin  Family History  Problem Relation Age of Onset   Breast cancer Mother        died age 55   Colon cancer Mother    Arthritis Mother    Alzheimer's disease Mother    Coronary artery disease Father        died age 43   Stomach cancer Father    Arthritis Father    Stroke Sister    Heart  failure Sister    Hypertension Sister    Arthritis Maternal Grandmother    Arthritis Paternal Grandmother    Breast cancer Paternal Grandmother    Alzheimer's disease Maternal Aunt        x5   Breast cancer Other        PGA    Social History   Socioeconomic History   Marital status: Widowed    Spouse name: Orlene Erm    Number of children: 2   Years of education: 15   Highest education level: Not on file  Occupational History   Occupation: DISABLED    Employer: UNEMPLOYED  Tobacco Use   Smoking status: Every Day    Packs/day: .25    Types: Cigarettes   Smokeless tobacco: Never   Tobacco comments:    2-3 cigs a day  Vaping Use   Vaping Use: Never used  Substance and Sexual Activity   Alcohol use: Not Currently    Comment: glass of wine occasionally   Drug use: No   Sexual activity: Yes    Birth control/protection: Post-menopausal  Other Topics Concern   Not on file  Social History Narrative   Lives with her husband and her Chow dog.    Social Determinants of Health   Financial Resource Strain: Not on file  Food Insecurity: Not on file  Transportation Needs: Not on file  Physical Activity: Not on file  Stress: Not on file  Social Connections: Not on file  Intimate Partner Violence: Not on file    ROS  PHYSICAL EXAMINATION:    LMP 12/01/2001     General appearance: alert, cooperative and appears stated age  Pelvic: External genitalia:  no lesions              Urethra:  normal appearing urethra with no masses, tenderness or lesions              Bartholins and Skenes: normal                 Vagina: normal appearing vagina with normal color and discharge, no lesions              Cervix: no lesions and slightly stenotic                Pelvic ultrasound  Indications: PMP bleeding  Findings:  Uterus 6.57 x 5.45 x 3.17 cm, anteverted Multiple mall intramural myomas, 6 myomas measured, largest was 1.97 cm  Endometrium 2.24 mm, supoptimal  visualization  Left ovary 1.54 x 0.9 x 1.27 cm  Right ovary not seen   No free fluid  Sonohysterogram The procedure and risks of the procedure were reviewed with the patient, consent form was signed. A speculum was placed in the vagina and the cervix was cleansed with betadine. The sonohysterogram catheter was inserted into the uterine cavity without difficulty. Saline was infused under direct observation with the ultrasound. She appeared to have a large polyp. The cavity didn't distend well, but intracavitary mass was noted to be moving with instillation of fluid. The patient was uncomfortable during the procedure and visualization was also somewhat limited from intramural fibroids. No intracavitary fibroids noted.The catheter was removed.   Impression:  Small anteverted uterus with multiple intracavitary fibroids Endometrial stripe not well visualized Right ovary not seen Left ovary atrophic Sonohysterogram with ~1.1 x 0.5 cm intracavitary defect c/w a polyp  Chaperone, Carolynn Serve, CMA was present for exam.   1. Post-menopausal bleeding Negative biopsy, normal pap. Sonohysterogram c/w polyp  2. Endometrial polyp Will send to Dr Hyacinth Meeker for hysteroscopy, polypectomy, D&C. ACOG handout given and reviewed.

## 2023-04-08 ENCOUNTER — Ambulatory Visit: Payer: Medicare HMO | Admitting: Family Medicine

## 2023-04-08 NOTE — Progress Notes (Deleted)
    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   Mood disorder (HCC) 05/19/2011   OVERWEIGHT 03/08/2009   CARPAL TUNNEL SYNDROME, LEFT 07/13/2008   Essential hypertension 07/05/2007   GERD 07/05/2007   HYPERCHOLESTEROLEMIA, MILD 03/05/2007    Current Outpatient Medications  Medication Instructions   amLODipine (NORVASC) 10 MG tablet TAKE 1 TABLET BY MOUTH EVERY DAY   atorvastatin (LIPITOR) 40 mg, Oral, Daily   hydrOXYzine (ATARAX) 25 MG tablet TAKE 1 TABLET BY MOUTH THREE TIMES A DAY AS NEEDED FOR ANXIETY   mirtazapine (REMERON) 7.5 MG tablet Oral   omeprazole (PRILOSEC) 20 mg, Oral, 2 times daily before meals   QUEtiapine (SEROQUEL) 50 MG tablet TAKE 3 TABS BY MOUTH BEFORE BED   sertraline (ZOLOFT) 50 mg, Oral, Daily   triamterene-hydrochlorothiazide (MAXZIDE-25) 37.5-25 MG tablet 1 tablet, Oral, Daily   vitamin B-12 (CYANOCOBALAMIN) 500 mcg, Oral, Daily   Vitamin D-3 1,000 Units, Oral, Daily       03/31/2023    3:49 PM 01/28/2023    2:38 PM 12/30/2022   10:18 AM  Vitals with BMI  Height  5' 4.5" 5' 4.5"  Weight 198 lbs 194 lbs 176 lbs 13 oz  BMI  32.8 29.89  Systolic 134 134 161  Diastolic 84 80 84  Pulse 96 88 82      OBJECTIVE:   LMP 12/01/2001   ***  ASSESSMENT/PLAN:   There are no diagnoses linked to this encounter.   There are no Patient Instructions on file for this visit.   Carney Living, MD [facility]

## 2023-04-09 ENCOUNTER — Telehealth: Payer: Self-pay | Admitting: *Deleted

## 2023-04-09 NOTE — Telephone Encounter (Signed)
Called and left message for patient to inform referral was sent to Dr. Rondel Baton office and left their number as well.

## 2023-04-09 NOTE — Telephone Encounter (Signed)
Patient left message on surgery line on 04/08/23 requesting return call regarding referral status to Dr. Hyacinth Meeker.   Per review of EPIC, referral placed 03/31/23.   Routing to [facility] to f/u on referral and update patient.

## 2023-04-14 ENCOUNTER — Ambulatory Visit (INDEPENDENT_AMBULATORY_CARE_PROVIDER_SITE_OTHER): Payer: Medicare HMO | Admitting: Family Medicine

## 2023-04-14 ENCOUNTER — Other Ambulatory Visit: Payer: Self-pay | Admitting: Family Medicine

## 2023-04-14 ENCOUNTER — Other Ambulatory Visit: Payer: Self-pay

## 2023-04-14 ENCOUNTER — Encounter: Payer: Self-pay | Admitting: Family Medicine

## 2023-04-14 VITALS — BP 135/86 | HR 66 | Ht 64.0 in | Wt 198.8 lb

## 2023-04-14 DIAGNOSIS — F1010 Alcohol abuse, uncomplicated: Secondary | ICD-10-CM

## 2023-04-14 DIAGNOSIS — E785 Hyperlipidemia, unspecified: Secondary | ICD-10-CM

## 2023-04-14 DIAGNOSIS — M5386 Other specified dorsopathies, lumbar region: Secondary | ICD-10-CM | POA: Diagnosis not present

## 2023-04-14 DIAGNOSIS — I10 Essential (primary) hypertension: Secondary | ICD-10-CM | POA: Diagnosis not present

## 2023-04-14 DIAGNOSIS — Z720 Tobacco use: Secondary | ICD-10-CM | POA: Diagnosis not present

## 2023-04-14 MED ORDER — DICLOFENAC SODIUM 1 % EX GEL
CUTANEOUS | 3 refills | Status: DC
Start: 2023-04-14 — End: 2023-07-14

## 2023-04-14 NOTE — Assessment & Plan Note (Signed)
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  No sxs of significant nerve compression.  Continue exercises and as needed tramadol.  Add topic diclofenac.  If not improving consider xray and Physical Therapy

## 2023-04-14 NOTE — Assessment & Plan Note (Signed)
Relates has stopped for last 2 weeks.  Encouraged her to continue as does her family

## 2023-04-14 NOTE — Patient Instructions (Signed)
Good to see you today - Thank you for coming in  Things we discussed today:  Back Continue tramadol as needed  Use the diclofenac gel four times a day   I will call you if your tests are not good.  Otherwise, I will send you a message on MyChart (if it is active) or a letter in the mail..  If you do not hear from me with in 2 weeks please call our office.     CONGRATULATIONS on stopping smoking   Please always bring your medication bottles  Come back to see me in 3 months

## 2023-04-14 NOTE — Progress Notes (Signed)
    SUBJECTIVE:   CHIEF COMPLAINT / HPI:   Tobacco Stopped smoking 2 weeks ago.  Thinks helps her sleep better  Back Pain Continues with intermittently worse mid and lower back pain with intermittent radiation to posterior thigh.  No weakness or incontinence Tylenol make blood pressure goes up  NSAIDs hurt her stomach She takes tramadol once to qod   Mood Is seeing counselor at [employer] seroquel at night and sertraline daily  Tried but stopped remeron as caused weight gain Overall feels is doing better No alcohol     OBJECTIVE:   BP 135/86   Pulse 66   Ht 5\' 4"  (1.626 m)   Wt 198 lb 12.8 oz (90.2 kg)   LMP 12/01/2001   SpO2 97%   BMI 34.12 kg/m   Good spirits Back - mildly diffusely tender over lower thoracic and lumbar spine without focal pain or deformity Able to stand on toes and heels and do moderate deep knee bend   ASSESSMENT/PLAN:   Sciatica associated with disorder of lumbar spine Assessment & Plan: Seems to be mild exacerbation of 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  No sxs of significant nerve compression.  Continue exercises and as needed tramadol.  Add topic diclofenac.  If not improving consider xray and Physical Therapy   Orders: -     Diclofenac Sodium; Apply to back up to four times a day  Dispense: 100 g; Refill: 3  Essential hypertension -     CMP14+EGFR -     CBC  Hyperlipidemia, unspecified hyperlipidemia type -     Lipid panel  Alcohol abuse -     Folate  Tobacco use Assessment & Plan: Relates has stopped for last 2 weeks.  Encouraged her to continue as does her family     Checking above labs as requested by [facility]  Patient Instructions  Good to see you today - Thank you for coming in  Things we discussed today:  Back Continue tramadol as needed  Use the diclofenac gel four times a day   I will call you if your tests are not good.  Otherwise, I will send you a message on MyChart (if it is active) or a letter in the  mail..  If you do not hear from me with in 2 weeks please call our office.     CONGRATULATIONS on stopping smoking   Please always bring your medication bottles  Come back to see me in 3 months    Carney Living, MD [facility]

## 2023-04-15 LAB — CMP14+EGFR
ALT: 20 IU/L (ref 0–32)
AST: 20 IU/L (ref 0–40)
Albumin/Globulin Ratio: 1.7 (ref 1.2–2.2)
Albumin: 4.7 g/dL (ref 3.8–4.8)
Alkaline Phosphatase: 97 IU/L (ref 44–121)
BUN/Creatinine Ratio: 20 (ref 12–28)
BUN: 16 mg/dL (ref 8–27)
Bilirubin Total: 0.4 mg/dL (ref 0.0–1.2)
CO2: 23 mmol/L (ref 20–29)
Calcium: 10.2 mg/dL (ref 8.7–10.3)
Chloride: 104 mmol/L (ref 96–106)
Creatinine, Ser: 0.79 mg/dL (ref 0.57–1.00)
Globulin, Total: 2.7 g/dL (ref 1.5–4.5)
Glucose: 99 mg/dL (ref 70–99)
Potassium: 4.1 mmol/L (ref 3.5–5.2)
Sodium: 141 mmol/L (ref 134–144)
Total Protein: 7.4 g/dL (ref 6.0–8.5)
eGFR: 80 mL/min/{1.73_m2} (ref >59)

## 2023-04-15 LAB — LIPID PANEL
Chol/HDL Ratio: 3.4 ratio (ref 0.0–4.4)
Cholesterol, Total: 213 mg/dL — ABNORMAL HIGH (ref 100–199)
HDL: 63 mg/dL (ref >39)
LDL Chol Calc (NIH): 136 mg/dL — ABNORMAL HIGH (ref 0–99)
Triglycerides: 80 mg/dL (ref 0–149)
VLDL Cholesterol Cal: 14 mg/dL (ref 5–40)

## 2023-04-15 LAB — CBC
Hematocrit: 36.6 % (ref 34.0–46.6)
Hemoglobin: 12.8 g/dL (ref 11.1–15.9)
MCH: 30.3 pg (ref 26.6–33.0)
MCHC: 35 g/dL (ref 31.5–35.7)
MCV: 87 fL (ref 79–97)
Platelets: 231 10*3/uL (ref 150–450)
RBC: 4.23 x10E6/uL (ref 3.77–5.28)
RDW: 11.7 % (ref 11.7–15.4)
WBC: 6.3 10*3/uL (ref 3.4–10.8)

## 2023-04-15 LAB — FOLATE: Folate: >20 ng/mL (ref >3.0)

## 2023-04-19 ENCOUNTER — Other Ambulatory Visit: Payer: Self-pay | Admitting: Family Medicine

## 2023-04-20 ENCOUNTER — Other Ambulatory Visit: Payer: Self-pay | Admitting: Family Medicine

## 2023-04-20 DIAGNOSIS — M5386 Other specified dorsopathies, lumbar region: Secondary | ICD-10-CM

## 2023-04-27 NOTE — Telephone Encounter (Signed)
Per review of EPIC, patient is scheduled with Dr. Hyacinth Meeker on 06/04/23.   Routing to Dr. Shirley Friar.   Cc: Marena Chancy

## 2023-04-29 ENCOUNTER — Other Ambulatory Visit: Payer: Self-pay | Admitting: Family Medicine

## 2023-04-30 DIAGNOSIS — F330 Major depressive disorder, recurrent, mild: Secondary | ICD-10-CM | POA: Diagnosis not present

## 2023-05-01 ENCOUNTER — Other Ambulatory Visit: Payer: Self-pay | Admitting: Family Medicine

## 2023-05-04 ENCOUNTER — Ambulatory Visit (INDEPENDENT_AMBULATORY_CARE_PROVIDER_SITE_OTHER): Payer: Medicare HMO

## 2023-05-04 ENCOUNTER — Encounter: Payer: Self-pay | Admitting: Family Medicine

## 2023-05-04 VITALS — Ht 64.0 in | Wt 198.0 lb

## 2023-05-04 DIAGNOSIS — Z0000 Encounter for general adult medical examination without abnormal findings: Secondary | ICD-10-CM | POA: Diagnosis not present

## 2023-05-04 NOTE — Patient Instructions (Signed)
Latoya Swanson , Thank you for taking time to come for your Medicare Wellness Visit. I appreciate your ongoing commitment to your health goals. Please review the following plan we discussed and let me know if I can assist you in the future.   These are the goals we discussed:  Goals      Remain active and independent        This is a list of the screening recommended for you and due dates:  Health Maintenance  Topic Date Due   Zoster (Shingles) Vaccine (1 of 2) Never done   DTaP/Tdap/Td vaccine (2 - Tdap) 11/07/2018   Flu Shot  06/11/2023   Mammogram  07/30/2023   Medicare Annual Wellness Visit  05/03/2024   Colon Cancer Screening  08/15/2024   Pneumonia Vaccine  Completed   DEXA scan (bone density measurement)  Completed   COVID-19 Vaccine  Completed   Hepatitis C Screening  Completed   HPV Vaccine  Aged Out    Advanced directives: Information on Advanced Care Planning can be found at [facility] Advance Health Care Directives Advance Health Care Directives ([email or web address] Please bring a copy of your health care power of attorney and living will to the office to be added to your chart at your convenience.  Conditions/risks identified: Aim for 30 minutes of exercise or brisk walking, 6-8 glasses of water, and 5 servings of fruits and vegetables each day.  Next appointment: Follow up in one year for your annual wellness visit    Preventive Care 65 Years and Older, Female Preventive care refers to lifestyle choices and visits with your health care provider that can promote health and wellness. What does preventive care include? A yearly physical exam. This is also called an annual well check. Dental exams once or twice a year. Routine eye exams. Ask your health care provider how often you should have your eyes checked. Personal lifestyle choices, including: Daily care of your teeth and gums. Regular physical activity. Eating a healthy diet. Avoiding tobacco and  drug use. Limiting alcohol use. Practicing safe sex. Taking low-dose aspirin every day. Taking vitamin and mineral supplements as recommended by your health care provider. What happens during an annual well check? The services and screenings done by your health care provider during your annual well check will depend on your age, overall health, lifestyle risk factors, and family history of disease. Counseling  Your health care provider may ask you questions about your: Alcohol use. Tobacco use. Drug use. Emotional well-being. Home and relationship well-being. Sexual activity. Eating habits. History of falls. Memory and ability to understand (cognition). Work and work Astronomer. Reproductive health. Screening  You may have the following tests or measurements: Height, weight, and BMI. Blood pressure. Lipid and cholesterol levels. These may be checked every 5 years, or more frequently if you are over 86 years old. Skin check. Lung cancer screening. You may have this screening every year starting at age 36 if you have a 30-pack-year history of smoking and currently smoke or have quit within the past 15 years. Fecal occult blood test (FOBT) of the stool. You may have this test every year starting at age 71. Flexible sigmoidoscopy or colonoscopy. You may have a sigmoidoscopy every 5 years or a colonoscopy every 10 years starting at age 82. Hepatitis C blood test. Hepatitis B blood test. Sexually transmitted disease (STD) testing. Diabetes screening. This is done by checking your blood sugar (glucose) after you have not eaten for a  while (fasting). You may have this done every 1-3 years. Bone density scan. This is done to screen for osteoporosis. You may have this done starting at age 36. Mammogram. This may be done every 1-2 years. Talk to your health care provider about how often you should have regular mammograms. Talk with your health care provider about your test results, treatment  options, and if necessary, the need for more tests. Vaccines  Your health care provider may recommend certain vaccines, such as: Influenza vaccine. This is recommended every year. Tetanus, diphtheria, and acellular pertussis (Tdap, Td) vaccine. You may need a Td booster every 10 years. Zoster vaccine. You may need this after age 74. Pneumococcal 13-valent conjugate (PCV13) vaccine. One dose is recommended after age 47. Pneumococcal polysaccharide (PPSV23) vaccine. One dose is recommended after age 53. Talk to your health care provider about which screenings and vaccines you need and how often you need them. This information is not intended to replace advice given to you by your health care provider. Make sure you discuss any questions you have with your health care provider. Document Released: 11/23/2015 Document Revised: 07/16/2016 Document Reviewed: 08/28/2015 Elsevier Interactive Patient Education  2017 [place] Prevention in the Home Falls can cause injuries. They can happen to people of all ages. There are many things you can do to make your home safe and to help prevent falls. What can I do on the outside of my home? Regularly fix the edges of walkways and driveways and fix any cracks. Remove anything that might make you trip as you walk through a door, such as a raised step or threshold. Trim any bushes or trees on the path to your home. Use bright outdoor lighting. Clear any walking paths of anything that might make someone trip, such as rocks or tools. Regularly check to see if handrails are loose or broken. Make sure that both sides of any steps have handrails. Any raised decks and porches should have guardrails on the edges. Have any leaves, snow, or ice cleared regularly. Use sand or salt on walking paths during winter. Clean up any spills in your garage right away. This includes oil or grease spills. What can I do in the bathroom? Use night lights. Install grab  bars by the toilet and in the tub and shower. Do not use towel bars as grab bars. Use non-skid mats or decals in the tub or shower. If you need to sit down in the shower, use a plastic, non-slip stool. Keep the floor dry. Clean up any water that spills on the floor as soon as it happens. Remove soap buildup in the tub or shower regularly. Attach bath mats securely with double-sided non-slip rug tape. Do not have throw rugs and other things on the floor that can make you trip. What can I do in the bedroom? Use night lights. Make sure that you have a light by your bed that is easy to reach. Do not use any sheets or blankets that are too big for your bed. They should not hang down onto the floor. Have a firm chair that has side arms. You can use this for support while you get dressed. Do not have throw rugs and other things on the floor that can make you trip. What can I do in the kitchen? Clean up any spills right away. Avoid walking on wet floors. Keep items that you use a lot in easy-to-reach places. If you need to reach something above you,  use a strong step stool that has a grab bar. Keep electrical cords out of the way. Do not use floor polish or wax that makes floors slippery. If you must use wax, use non-skid floor wax. Do not have throw rugs and other things on the floor that can make you trip. What can I do with my stairs? Do not leave any items on the stairs. Make sure that there are handrails on both sides of the stairs and use them. Fix handrails that are broken or loose. Make sure that handrails are as Rubin as the stairways. Check any carpeting to make sure that it is firmly attached to the stairs. Fix any carpet that is loose or worn. Avoid having throw rugs at the top or bottom of the stairs. If you do have throw rugs, attach them to the floor with carpet tape. Make sure that you have a light switch at the top of the stairs and the bottom of the stairs. If you do not have them,  ask someone to add them for you. What else can I do to help prevent falls? Wear shoes that: Do not have high heels. Have rubber bottoms. Are comfortable and fit you well. Are closed at the toe. Do not wear sandals. If you use a stepladder: Make sure that it is fully opened. Do not climb a closed stepladder. Make sure that both sides of the stepladder are locked into place. Ask someone to hold it for you, if possible. Clearly mark and make sure that you can see: Any grab bars or handrails. First and last steps. Where the edge of each step is. Use tools that help you move around (mobility aids) if they are needed. These include: Canes. Walkers. Scooters. Crutches. Turn on the lights when you go into a dark area. Replace any light bulbs as soon as they burn out. Set up your furniture so you have a clear path. Avoid moving your furniture around. If any of your floors are uneven, fix them. If there are any pets around you, be aware of where they are. Review your medicines with your doctor. Some medicines can make you feel dizzy. This can increase your chance of falling. Ask your doctor what other things that you can do to help prevent falls. This information is not intended to replace advice given to you by your health care provider. Make sure you discuss any questions you have with your health care provider. Document Released: 08/23/2009 Document Revised: 04/03/2016 Document Reviewed: 12/01/2014 Elsevier Interactive Patient Education  2017 [employer].

## 2023-05-04 NOTE — Progress Notes (Cosign Needed)
Subjective:   Latoya Swanson is a 72 y.o. female who presents for an Initial Medicare Annual Wellness Visit.  Visit Complete: Virtual  I connected with  Latoya Swanson on 05/04/23 by a audio enabled telemedicine application and verified that I am speaking with the correct person using two identifiers.  Patient Location: Home  Provider Location: Home Office  I discussed the limitations of evaluation and management by telemedicine. The patient expressed understanding and agreed to proceed.  Review of Systems     Cardiac Risk Factors include: advanced age (>21men, >1 women);hypertension;smoking/ tobacco exposure;dyslipidemia     Objective:    Today's Vitals   05/04/23 1406  Weight: 198 lb (89.8 kg)  Height: 5\' 4"  (1.626 m)   Body mass index is 33.99 kg/m.     05/04/2023    7:14 PM 12/30/2022   10:21 AM 12/17/2022    9:40 AM 11/19/2022    8:57 AM 09/02/2022   10:19 AM 07/02/2022   10:18 AM 05/17/2022    5:41 PM  Advanced Directives  Does Patient Have a Medical Advance Directive? No No No No No No No  Would patient like information on creating a medical advance directive? Yes (MAU/Ambulatory/Procedural Areas - Information given) No - Patient declined No - Patient declined No - Patient declined  No - Patient declined No - Patient declined    Current Medications (verified) Outpatient Encounter Medications as of 05/04/2023  Medication Sig   amLODipine (NORVASC) 10 MG tablet TAKE 1 TABLET BY MOUTH EVERY DAY   atorvastatin (LIPITOR) 40 MG tablet TAKE 1 TABLET BY MOUTH EVERY DAY (Patient taking differently: Take 40 mg by mouth every other day.)   Cholecalciferol (VITAMIN D-3) 1000 UNITS CAPS Take 1 capsule (1,000 Units total) by mouth daily.   diclofenac Sodium (VOLTAREN ARTHRITIS PAIN) 1 % GEL Apply to back up to four times a day   hydrOXYzine (ATARAX) 25 MG tablet TAKE 1 TABLET BY MOUTH THREE TIMES A DAY AS NEEDED FOR ANXIETY   omeprazole (PRILOSEC) 20 MG capsule Take 1 capsule (20 mg  total) by mouth 2 (two) times daily before a meal for 14 days. (Patient taking differently: Take 20 mg by mouth daily as needed (for acid reflux).)   QUEtiapine (SEROQUEL) 50 MG tablet TAKE 3 TABS BY MOUTH BEFORE BED   sertraline (ZOLOFT) 25 MG tablet Take 3 tablets (75 mg total) by mouth daily.   traMADol (ULTRAM) 50 MG tablet TAKE 1 TABLET BY MOUTH EVERY 12 HOURS AS NEEDED.   triamterene-hydrochlorothiazide (MAXZIDE-25) 37.5-25 MG tablet TAKE 1 TABLET BY MOUTH EVERY DAY   vitamin B-12 (CYANOCOBALAMIN) 500 MCG tablet Take 500 mcg by mouth daily.   mirtazapine (REMERON) 7.5 MG tablet Take by mouth. (Patient not taking: Reported on 05/04/2023)   No facility-administered encounter medications on file as of 05/04/2023.    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   History of rectal fissure    Hypertension    Normal cardiac stress test 11/10/2006   Latoya Swanson   Normal  echocardiogram 08/19/2007   Past Surgical History:  Procedure Laterality Date   ANAL FISSURE REPAIR     CARPAL TUNNEL RELEASE Bilateral    DILATION AND CURETTAGE OF UTERUS     laparotomy salpingectomy     ectopic pregnancy   ROTATOR CUFF REPAIR Right    x 2   TUBAL LIGATION     Family History  Problem Relation Age of Onset   Breast cancer Mother        died age 70   Colon cancer Mother    Arthritis Mother    Alzheimer's disease Mother    Coronary artery disease Father        died age 75   Stomach cancer Father    Arthritis Father    Stroke Sister    Heart failure Sister    Hypertension Sister    Arthritis Maternal Grandmother    Arthritis Paternal Grandmother    Breast  cancer Paternal Grandmother    Alzheimer's disease Maternal Aunt        x5   Breast cancer Other        PGA   Social History   Socioeconomic History   Marital status: Widowed    Spouse name: Latoya Swanson    Number of children: 2   Years of education: 15   Highest education level: Not on file  Occupational History   Occupation: DISABLED    Employer: UNEMPLOYED  Tobacco Use   Smoking status: Every Day    Packs/day: .25    Types: Cigarettes   Smokeless tobacco: Never   Tobacco comments:    2-3 cigs a day  Vaping Use   Vaping Use: Never used  Substance and Sexual Activity   Alcohol use: Not Currently    Comment: glass of wine occasionally   Drug use: No   Sexual activity: Yes    Birth control/protection: Post-menopausal  Other Topics Concern   Not on file  Social History Narrative   Lives with her husband and her Chow dog.    Social Determinants of Health   Financial Resource Strain: Low Risk  (05/04/2023)   Overall Financial Resource Strain (CARDIA)    Difficulty of Paying Living Expenses: Not hard at all  Food Insecurity: No Food Insecurity (05/04/2023)   Hunger Vital Sign    Worried About Running Out of Food in the Last Year: Never true    Ran Out of Food in the Last Year: Never true  Transportation Needs: No Transportation Needs (05/04/2023)   PRAPARE - Administrator, Civil Service (Medical): No    Lack of Transportation (Non-Medical): No  Physical Activity: Inactive (05/04/2023)   Exercise Vital Sign    Days of Exercise per Week: 0 days    Minutes of Exercise per Session: 0 min  Stress: No Stress Concern Present (05/04/2023)   [employer] of Occupational Health - Occupational Stress Questionnaire    Feeling of Stress : Not at all  Social Connections: Moderately Integrated (05/04/2023)   Social Connection and Isolation Panel [NHANES]    Frequency of Communication with Friends and Family: More than three times a week    Frequency of Social  Gatherings with Friends and Family: Three times a week    Attends Religious Services: More than 4 times per year    Active Member of Clubs or Organizations: No    Attends Banker Meetings: Never    Marital Status: Married    Tobacco Counseling Ready to quit:  Not Answered Counseling given: Not Answered Tobacco comments: 2-3 cigs a day   Clinical Intake:  Pre-visit preparation completed: Yes  Pain : No/denies pain     Diabetes: No  How often do you need to have someone help you when you read instructions, pamphlets, or other written materials from your doctor or pharmacy?: 1 - Never  Interpreter Needed?: No  Information entered by :: Kandis Fantasia LPN   Activities of Daily Living    05/04/2023    7:13 PM 05/17/2022    5:51 PM  In your present state of health, do you have any difficulty performing the following activities:  Hearing? 0   Vision? 0   Difficulty concentrating or making decisions? 0   Walking or climbing stairs? 0   Dressing or bathing? 0   Doing errands, shopping? 0 1  Preparing Food and eating ? N   Using the Toilet? N   In the past six months, have you accidently leaked urine? N   Do you have problems with loss of bowel control? N   Managing your Medications? N   Managing your Finances? N   Housekeeping or managing your Housekeeping? N     Patient Care Team: Carney Living, MD as PCP - General Illene Labrador, OD (Optometry) Kelton Pillar, Craig Guess, MD as Attending Physician (Obstetrics and Gynecology)  Indicate any recent Medical Services you may have received from other than [facility] providers in the past year (date may be approximate).     Assessment:   This is a routine wellness examination for [facility].  Hearing/Vision screen Hearing Screening - Comments:: Denies hearing difficulties   Vision Screening - Comments:: Wears rx glasses - up to date with routine eye exams with [facility]    Dietary issues and exercise  activities discussed:     Goals Addressed             This Visit's Progress    Remain active and independent        Depression Screen    05/04/2023    7:11 PM 04/14/2023   11:19 AM 12/30/2022   10:22 AM 12/17/2022    9:40 AM 11/19/2022    8:57 AM 10/07/2022   11:21 AM 09/02/2022   10:19 AM  PHQ 2/9 Scores  PHQ - 2 Score 3 3 6   2 3   PHQ- 9 Score 11 11 17   7 11   Exception Documentation    Patient refusal Patient refusal      Fall Risk    05/04/2023    7:13 PM 04/14/2023   11:19 AM 12/30/2022   10:21 AM 12/17/2022    9:39 AM 11/19/2022    8:57 AM  Fall Risk   Falls in the past year? 0 0 0 0 0  Number falls in past yr: 0 0 0 0 0  Injury with Fall? 0 0 0 0 0  Risk for fall due to : No Fall Risks      Follow up Falls prevention discussed;Education provided;Falls evaluation completed        MEDICARE RISK AT HOME:  Medicare Risk at Home - 05/04/23 1913     Any stairs in or around the home? No    If so, are there any without handrails? No    Home free of loose throw rugs in walkways, pet beds, electrical cords, etc? Yes    Adequate lighting in your home to reduce risk of falls? Yes    Life alert? No  Use of a cane, walker or w/c? No    Grab bars in the bathroom? Yes    Shower chair or bench in shower? Yes    Elevated toilet seat or a handicapped toilet? Yes             TIMED UP AND GO:  Was the test performed? No    Cognitive Function:        05/04/2023    7:13 PM  6CIT Screen  What Year? 0 points  What month? 0 points  What time? 0 points  Count back from 20 0 points  Months in reverse 0 points  Repeat phrase 0 points  Total Score 0 points    Immunizations Immunization History  Administered Date(s) Administered   COVID-19, mRNA, vaccine(Comirnaty)12 years and older 10/07/2022   Fluad Quad(high Dose 65+) 09/02/2022   H1N1 11/07/2008   Influenza Split 08/18/2012   Influenza Whole 09/10/2009, 08/28/2010   Influenza, High Dose Seasonal PF 09/28/2018    Influenza,inj,Quad PF,6+ Mos 07/13/2014, 07/18/2015, 09/01/2016, 07/31/2017, 08/17/2019   PFIZER Comirnaty(Gray Top)Covid-19 Tri-Sucrose Vaccine 12/05/2020   PFIZER(Purple Top)SARS-COV-2 Vaccination 01/06/2020, 01/31/2020   Pneumococcal Conjugate-13 07/31/2017   Pneumococcal Polysaccharide-23 11/07/2008, 09/28/2018   Td 11/07/2008   Zoster, Live 11/10/2014    TDAP status: Due, Education has been provided regarding the importance of this vaccine. Advised may receive this vaccine at local pharmacy or Health Dept. Aware to provide a copy of the vaccination record if obtained from local pharmacy or Health Dept. Verbalized acceptance and understanding.  Pneumococcal vaccine status: Up to date  Covid-19 vaccine status: Information provided on how to obtain vaccines.   Qualifies for Shingles Vaccine? Yes   Zostavax completed Yes   Shingrix Completed?: No.    Education has been provided regarding the importance of this vaccine. Patient has been advised to call insurance company to determine out of pocket expense if they have not yet received this vaccine. Advised may also receive vaccine at local pharmacy or Health Dept. Verbalized acceptance and understanding.  Screening Tests Health Maintenance  Topic Date Due   Zoster Vaccines- Shingrix (1 of 2) Never done   DTaP/Tdap/Td (2 - Tdap) 11/07/2018   INFLUENZA VACCINE  06/11/2023   MAMMOGRAM  07/30/2023   Medicare Annual Wellness (AWV)  05/03/2024   Colonoscopy  08/15/2024   Pneumonia Vaccine 89+ Years old  Completed   DEXA SCAN  Completed   COVID-19 Vaccine  Completed   Hepatitis C Screening  Completed   HPV VACCINES  Aged Out    Health Maintenance  Health Maintenance Due  Topic Date Due   Zoster Vaccines- Shingrix (1 of 2) Never done   DTaP/Tdap/Td (2 - Tdap) 11/07/2018    Colorectal cancer screening: Type of screening: Colonoscopy. Completed 08/15/21. Repeat every 3 years  Mammogram status: Completed 07/29/21. Repeat every  year  Bone Density status: Completed 11/07/21. Results reflect: Bone density results: OSTEOPENIA. Repeat every 2 years.  Lung Cancer Screening: (Low Dose CT Chest recommended if Age 57-80 years, 20 pack-year currently smoking OR have quit w/in 15years.) does qualify.   Lung Cancer Screening Referral: n/a  Additional Screening:  Hepatitis C Screening: does qualify; Completed 05/15/21  Vision Screening: Recommended annual ophthalmology exams for early detection of glaucoma and other disorders of the eye. Is the patient up to date with their annual eye exam?  Yes  Who is the provider or what is the name of the office in which the patient attends annual eye exams? Dr. Illene Labrador  If pt is not established with a provider, would they like to be referred to a provider to establish care? No .   Dental Screening: Recommended annual dental exams for proper oral hygiene  Community Resource Referral / Chronic Care Management: CRR required this visit?  No   CCM required this visit?  No     Plan:     I have personally reviewed and noted the following in the patient's chart:   Medical and social history Use of alcohol, tobacco or illicit drugs  Current medications and supplements including opioid prescriptions. Patient is not currently taking opioid prescriptions. Functional ability and status Nutritional status Physical activity Advanced directives List of other physicians Hospitalizations, surgeries, and ER visits in previous 12 months Vitals Screenings to include cognitive, depression, and falls Referrals and appointments  In addition, I have reviewed and discussed with patient certain preventive protocols, quality metrics, and best practice recommendations. A written personalized care plan for preventive services as well as general preventive health recommendations were provided to patient.     Kandis Fantasia [place], [place]   05/09/5283   After Visit Summary: (Mail) Due to this  being a telephonic visit, the after visit summary with patients personalized plan was offered to patient via mail   Nurse Notes: No concerns

## 2023-05-13 ENCOUNTER — Other Ambulatory Visit: Payer: Self-pay | Admitting: Family Medicine

## 2023-05-22 ENCOUNTER — Other Ambulatory Visit: Payer: Self-pay | Admitting: Family Medicine

## 2023-06-03 ENCOUNTER — Other Ambulatory Visit: Payer: Self-pay | Admitting: Family Medicine

## 2023-06-03 DIAGNOSIS — G4700 Insomnia, unspecified: Secondary | ICD-10-CM

## 2023-06-04 ENCOUNTER — Encounter (HOSPITAL_BASED_OUTPATIENT_CLINIC_OR_DEPARTMENT_OTHER): Payer: Medicare HMO | Admitting: Obstetrics & Gynecology

## 2023-06-22 ENCOUNTER — Other Ambulatory Visit: Payer: Self-pay | Admitting: Family Medicine

## 2023-07-05 ENCOUNTER — Other Ambulatory Visit: Payer: Self-pay | Admitting: Family Medicine

## 2023-07-05 DIAGNOSIS — G4700 Insomnia, unspecified: Secondary | ICD-10-CM

## 2023-07-06 ENCOUNTER — Encounter: Payer: Self-pay | Admitting: Family Medicine

## 2023-07-06 DIAGNOSIS — G4700 Insomnia, unspecified: Secondary | ICD-10-CM

## 2023-07-06 MED ORDER — QUETIAPINE FUMARATE 50 MG PO TABS: ORAL_TABLET | ORAL | 0 refills | Status: DC

## 2023-07-14 ENCOUNTER — Ambulatory Visit (INDEPENDENT_AMBULATORY_CARE_PROVIDER_SITE_OTHER): Payer: Medicare HMO | Admitting: Family Medicine

## 2023-07-14 ENCOUNTER — Encounter: Payer: Self-pay | Admitting: Family Medicine

## 2023-07-14 ENCOUNTER — Other Ambulatory Visit: Payer: Self-pay

## 2023-07-14 VITALS — BP 143/86 | HR 78 | Ht 64.0 in | Wt 201.0 lb

## 2023-07-14 DIAGNOSIS — I10 Essential (primary) hypertension: Secondary | ICD-10-CM

## 2023-07-14 DIAGNOSIS — Z720 Tobacco use: Secondary | ICD-10-CM

## 2023-07-14 DIAGNOSIS — M5386 Other specified dorsopathies, lumbar region: Secondary | ICD-10-CM | POA: Diagnosis not present

## 2023-07-14 DIAGNOSIS — F1010 Alcohol abuse, uncomplicated: Secondary | ICD-10-CM | POA: Diagnosis not present

## 2023-07-14 DIAGNOSIS — F39 Unspecified mood [affective] disorder: Secondary | ICD-10-CM | POA: Diagnosis not present

## 2023-07-14 MED ORDER — SERTRALINE HCL 100 MG PO TABS: 100.0000 mg | ORAL_TABLET | Freq: Every day | ORAL | 1 refills | Status: DC

## 2023-07-14 MED ORDER — DICLOFENAC SODIUM 1 % EX GEL
CUTANEOUS | 3 refills | Status: DC
Start: 2023-07-14 — End: 2024-07-18

## 2023-07-14 MED ORDER — TRAMADOL HCL 50 MG PO TABS: 50.0000 mg | ORAL_TABLET | Freq: Two times a day (BID) | ORAL | 3 refills | Status: DC | PRN

## 2023-07-14 MED ORDER — ZOSTER VAC RECOMB ADJUVANTED 50 MCG/0.5ML IM SUSR: INTRAMUSCULAR | 1 refills | Status: AC

## 2023-07-14 NOTE — Assessment & Plan Note (Signed)
Near goal today.  Continue to work on weight and exercise.

## 2023-07-14 NOTE — Assessment & Plan Note (Signed)
Stable.  Discussed goal is to not take hydroxyzine ever day.  Will increase sertraline to 100 mg daily and continue exercise.  Continue as needed Seroquel 150 mg at night  She does not want to follow up with [facility] nor counseling.

## 2023-07-14 NOTE — Assessment & Plan Note (Signed)
Congratulated on staying smoke free

## 2023-07-14 NOTE — Patient Instructions (Signed)
Good to see you today - Thank you for coming in  Things we discussed today:  I sent a prescription to your pharmacy for your Zoster vaccines to help prevent Shingles.  The shot may cause a sore arm and mild flu like symptoms for a few days.  You will need a second shot 2 months after your first.    Come back in 2 months for Covid and Flu vaccine  Muscle Cramps - Take 400 mg magnesium daily - Use diclofenac gel  Mood Go up to 100 mg daily of sertraline Take the hydralazine less than one a day   Please always bring your medication bottles

## 2023-07-14 NOTE — Progress Notes (Signed)
SUBJECTIVE:   CHIEF COMPLAINT / HPI:    Tobacco No tobacco since May     Back Pain - Muscle Spasms Having crampy pains in back and legs Is physically active around house and garden Not using cane nor walker  No weakness Tylenol makes her blood pressure goes up  NSAIDs hurt her stomach She takes tramadol once to qod  Not currently using diclofenac gel   Mood No longer seeing [facility].  The medication they prescribed made her gain weight.  Overall she feels she is doing much better. Taking seroquel 150 mg some evenings - maybe 3 times a week Taking hydroxyzine 25 mg at least 2 times a day  Taking sertraline 75 mg daily (although did not have this medication bottle today)    OBJECTIVE:   BP (!) 143/86   Pulse 78   Ht 5\' 4"  (1.626 m)   Wt 201 lb (91.2 kg)   LMP 12/01/2001   SpO2 98%   BMI 34.50 kg/m   Psych:  Cognition and judgment appear intact. Alert, communicative  and cooperative with normal attention span and concentration. No apparent delusions, illusions, hallucinations Heart - Regular rate and rhythm.  No murmurs, gallops or rubs.    Lungs:  Normal respiratory effort, chest expands symmetrically. Lungs are clear to auscultation, no crackles or wheezes. Able to do deep knee bend walk on heels and toes  FROM in upper extremities   ASSESSMENT/PLAN:   Tobacco use Assessment & Plan: Congratulated on staying smoke free    Sciatica associated with disorder of lumbar spine Assessment & Plan: Current pain sounds like muscle cramping.  Reassuring exam - she is actually moving much better than in the past.  Recommend continued magnesium and diclofenac gel and remain active   Orders: -     Diclofenac Sodium; Apply to back up to four times a day  Dispense: 100 g; Refill: 3  Other specified dorsopathies, lumbar region -     traMADol HCl; Take 1 tablet (50 mg total) by mouth every 12 (twelve) hours as needed.  Dispense: 30 tablet; Refill: 3  Essential  hypertension Assessment & Plan: Near goal today.  Continue to work on weight and exercise.     Alcohol abuse Assessment & Plan: Reports no drinking and feels well.   Mood disorder (HCC) Assessment & Plan: Stable.  Discussed goal is to not take hydroxyzine ever day.  Will increase sertraline to 100 mg daily and continue exercise.  Continue as needed Seroquel 150 mg at night  She does not want to follow up with [facility] nor counseling.     Other orders -     Sertraline HCl; Take 1 tablet (100 mg total) by mouth daily.  Dispense: 90 tablet; Refill: 1 -     Zoster Vac Recomb Adjuvanted; Inject 0.5 ml IM and Repeat in 2 months  Dispense: 0.5 mL; Refill: 1     Patient Instructions  Good to see you today - Thank you for coming in  Things we discussed today:  I sent a prescription to your pharmacy for your Zoster vaccines to help prevent Shingles.  The shot may cause a sore arm and mild flu like symptoms for a few days.  You will need a second shot 2 months after your first.    Come back in 2 months for Covid and Flu vaccine  Muscle Cramps - Take 400 mg magnesium daily - Use diclofenac gel  Mood Go up to 100 mg daily  of sertraline Take the hydralazine less than one a day   Please always bring your medication bottles     Carney Living, MD [facility]

## 2023-07-14 NOTE — Assessment & Plan Note (Signed)
Current pain sounds like muscle cramping.  Reassuring exam - she is actually moving much better than in the past.  Recommend continued magnesium and diclofenac gel and remain active

## 2023-07-14 NOTE — Assessment & Plan Note (Signed)
Reports no drinking and feels well.

## 2023-07-28 ENCOUNTER — Other Ambulatory Visit: Payer: Self-pay | Admitting: Family Medicine

## 2023-08-07 ENCOUNTER — Encounter (HOSPITAL_BASED_OUTPATIENT_CLINIC_OR_DEPARTMENT_OTHER): Payer: Medicare HMO | Admitting: Obstetrics & Gynecology

## 2023-08-16 ENCOUNTER — Other Ambulatory Visit: Payer: Self-pay | Admitting: Family Medicine

## 2023-08-18 ENCOUNTER — Encounter: Payer: Self-pay | Admitting: Obstetrics and Gynecology

## 2023-08-18 ENCOUNTER — Ambulatory Visit (INDEPENDENT_AMBULATORY_CARE_PROVIDER_SITE_OTHER): Payer: Medicare HMO | Admitting: Obstetrics and Gynecology

## 2023-08-18 VITALS — BP 124/84 | HR 73

## 2023-08-18 DIAGNOSIS — D219 Benign neoplasm of connective and other soft tissue, unspecified: Secondary | ICD-10-CM | POA: Diagnosis not present

## 2023-08-18 DIAGNOSIS — N950 Postmenopausal bleeding: Secondary | ICD-10-CM

## 2023-08-18 DIAGNOSIS — N840 Polyp of corpus uteri: Secondary | ICD-10-CM

## 2023-08-18 DIAGNOSIS — D500 Iron deficiency anemia secondary to blood loss (chronic): Secondary | ICD-10-CM

## 2023-08-18 NOTE — Progress Notes (Signed)
GYNECOLOGY  VISIT   HPI: 72 y.o.   Widowed Black or [place] American Not Hispanic or Latino  female   [phone] with Patient's last menstrual period was 12/01/2001.   here for further evaluation of PMP bleeding. Patient reports she has been waiting for surgery and waiting for surgical date.  She has continued to have heavy to periods of moderate uncontrolled bleeding Since Jan 2024. She is very active and does not want the bleeding to control her life or worry about cancer.  She would like a definitive management and does not want to have more procedures in the future. She reports can bleed >10 days a month. Now it is lighter.   U/S 11/20/22:  normal sized uterus with multiple small fibroids. Endometrial stripe was 3 mm, partially obscured by fibroids. Ovaries were not seen   01/28/23 pap: normal  01/28/23 Endometrial biopsy: benign inactive endometrium  GYNECOLOGIC HISTORY: Patient's last menstrual period was 12/01/2001. Contraception:PMP Menopausal hormone therapy: no        [facility]) (Order [id number]) MyChart Results Release  MyChart Status: Pending  Results Release   Surgical pathology( [place]/ [facility]) Order: [id number] Status: Edited Result - FINAL     Visible to patient: No (inaccessible in MyChart)     Next appt: 05/06/2024 at 01:30 PM in Family Medicine [facility] VISIT)     Dx: Postmenopausal bleeding   4 Result Notes    Component 6 mo ago  SURGICAL PATHOLOGY SURGICAL PATHOLOGY CASE: [id number] PATIENT: Latoya Swanson Surgical Pathology Report     Clinical History: PMB (cm)     FINAL MICROSCOPIC DIAGNOSIS:  A. ENDOMETRIUM, BIOPSY: - Benign inactive endometrium - Negative for hyperplasia or malignancy        OB History     Gravida  2   Para  2   Term  1   Preterm  1   AB      Living  3      SAB      IAB      Ectopic      Multiple  1   Live Births  3              Patient Active Problem List    Diagnosis Date Noted   Vaginal bleeding 11/19/2022   Alcohol abuse 05/17/2022   HLD (hyperlipidemia) 05/17/2022   Sciatica associated with disorder of lumbar spine 09/01/2016   Abnormal gall bladder diagnostic imaging 10/24/2015   Right hip pain 09/19/2015   Hepatic cirrhosis (HCC) 07/18/2015   Tobacco use 03/03/2012   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   History of rectal fissure    Hypertension    Normal cardiac stress test 11/10/2006   [name]   Normal echocardiogram 08/19/2007    Past Surgical History:  Procedure Laterality Date   ANAL FISSURE REPAIR  CARPAL TUNNEL RELEASE Bilateral    DILATION AND CURETTAGE OF UTERUS     laparotomy salpingectomy     ectopic pregnancy   ROTATOR CUFF REPAIR Right    x 2   TUBAL LIGATION      Current Outpatient Medications  Medication Sig Dispense Refill   amLODipine (NORVASC) 10 MG tablet TAKE 1 TABLET BY MOUTH EVERY DAY 90 tablet 2   atorvastatin (LIPITOR) 40 MG tablet TAKE 1 TABLET BY MOUTH EVERY DAY (Patient taking differently: Take 40 mg by mouth every other day.) 90 tablet 3   Cholecalciferol (VITAMIN D-3) 1000 UNITS CAPS Take 1 capsule (1,000 Units total) by mouth daily. 30 capsule 6   diclofenac Sodium (VOLTAREN ARTHRITIS PAIN) 1 % GEL Apply to back up to four times a day 100 g 3   hydrOXYzine (ATARAX) 25 MG tablet Take 25 mg by mouth 3 (three) times daily as needed.     QUEtiapine (SEROQUEL) 50  MG tablet TAKE 3 TABS BY MOUTH BEFORE BED 270 tablet 0   sertraline (ZOLOFT) 100 MG tablet Take 1 tablet (100 mg total) by mouth daily. 90 tablet 1   traMADol (ULTRAM) 50 MG tablet Take 1 tablet (50 mg total) by mouth every 12 (twelve) hours as needed. 30 tablet 3   triamterene-hydrochlorothiazide (MAXZIDE-25) 37.5-25 MG tablet TAKE 1 TABLET BY MOUTH EVERY DAY 90 tablet 1   vitamin B-12 (CYANOCOBALAMIN) 500 MCG tablet Take 500 mcg by mouth daily.     Zoster Vaccine Adjuvanted [facility]) injection Inject 0.5 ml IM and Repeat in 2 months 0.5 mL 1   No current facility-administered medications for this visit.     ALLERGIES: Aspirin  Family History  Problem Relation Age of Onset   Breast cancer Mother        died age 60   Colon cancer Mother    Arthritis Mother    Alzheimer's disease Mother    Coronary artery disease Father        died age 57   Stomach cancer Father    Arthritis Father    Stroke Sister    Heart failure Sister    Hypertension Sister    Arthritis Maternal Grandmother    Arthritis Paternal Grandmother    Breast cancer Paternal Grandmother    Alzheimer's disease Maternal Aunt        x5   Breast cancer Other        PGA    Social History   Socioeconomic History   Marital status: Widowed    Spouse name: Orlene Erm    Number of children: 2   Years of education: 15   Highest education level: Not on file  Occupational History   Occupation: DISABLED    Employer: UNEMPLOYED  Tobacco Use   Smoking status: Every Day    Current packs/day: 0.25    Types: Cigarettes   Smokeless tobacco: Never   Tobacco comments:    2-3 cigs a day  Vaping Use   Vaping status: Never Used  Substance and Sexual Activity   Alcohol use: Not Currently    Comment: glass of wine occasionally   Drug use: No   Sexual activity: Not Currently    Partners: Male    Birth control/protection: Post-menopausal  Other Topics Concern   Not on file  Social History Narrative   Lives with her  husband and her Chow dog.    Social Determinants of Health   Financial Resource Strain: Low Risk  (05/04/2023)   Overall  Financial Resource Strain (CARDIA)    Difficulty of Paying Living Expenses: Not hard at all  Food Insecurity: No Food Insecurity (05/04/2023)   Hunger Vital Sign    Worried About Running Out of Food in the Last Year: Never true    Ran Out of Food in the Last Year: Never true  Transportation Needs: No Transportation Needs (05/04/2023)   PRAPARE - Administrator, Civil Service (Medical): No    Lack of Transportation (Non-Medical): No  Physical Activity: Inactive (05/04/2023)   Exercise Vital Sign    Days of Exercise per Week: 0 days    Minutes of Exercise per Session: 0 min  Stress: No Stress Concern Present (05/04/2023)   [employer] of Occupational Health - Occupational Stress Questionnaire    Feeling of Stress : Not at all  Social Connections: Moderately Integrated (05/04/2023)   Social Connection and Isolation Panel [NHANES]    Frequency of Communication with Friends and Family: More than three times a week    Frequency of Social Gatherings with Friends and Family: Three times a week    Attends Religious Services: More than 4 times per year    Active Member of Clubs or Organizations: No    Attends Banker Meetings: Never    Marital Status: Married  Catering manager Violence: Not At Risk (05/04/2023)   Humiliation, Afraid, Rape, and Kick questionnaire    Fear of Current or Ex-Partner: No    Emotionally Abused: No    Physically Abused: No    Sexually Abused: No    ROS   Pelvic ultrasound  Indications: PMP bleeding  Findings:  Uterus 6.57 x 5.45 x 3.17 cm, anteverted Multiple mall intramural myomas, 6 myomas measured, largest was 1.97 cm  Endometrium 2.24 mm, supoptimal visualization  Left ovary 1.54 x 0.9 x 1.27 cm  Right ovary not seen   No free fluid  Sonohysterogram The procedure and risks of the procedure were  reviewed with the patient, consent form was signed. A speculum was placed in the vagina and the cervix was cleansed with betadine. The sonohysterogram catheter was inserted into the uterine cavity without difficulty. Saline was infused under direct observation with the ultrasound. She appeared to have a large polyp. The cavity didn't distend well, but intracavitary mass was noted to be moving with instillation of fluid. The patient was uncomfortable during the procedure and visualization was also somewhat limited from intramural fibroids. No intracavitary fibroids noted.The catheter was removed.   Impression:  Small anteverted uterus with multiple intracavitary fibroids Endometrial stripe not well visualized Right ovary not seen Left ovary atrophic Sonohysterogram with ~1.1 x 0.5 cm intracavitary defect c/w a polyp  Chaperone, Carolynn Serve, CMA was present for exam.   1. Post-menopausal bleeding Negative biopsy, normal pap. Sonohysterogram c/w polyp Patient with persistent bleeding.  Offered aygestin to help control bleeding until surgery.  She would like to wait on any hormones. Discussed in detail with r/b/a/I of myosure D&C vs. RLH.  Discussed with the D&C, she may still have bleeding in the future and need for additional procedure. She would like to have a definitive management and have the [facility] asap.  The bleeding has caused her much anxiety and affects her quality of her life.    Counseled extensively on the procedure including but not limited to what to expect and risks and benefits.  Counseled on postop care and pelvic rest for 10 weeks after the surgery with restricted lifting for 6 weeks after.  Counseled on the benefits of the robotic procedure with faster return to daily activities, improved outcomes, and less risk for complications.  She may need to stay overnight due to not having any family members.  A request was sent. 30 minutes spent on reviewing records, imaging,  and one on one  patient time and counseling patient and documentation Dr. Karma Greaser

## 2023-08-18 NOTE — H&P (View-Only) (Signed)
GYNECOLOGY  VISIT   HPI: 72 y.o.   Widowed Black or [place] American Not Hispanic or Latino  female   [phone] with Patient's last menstrual period was 12/01/2001.   here for further evaluation of PMP bleeding. Patient reports she has been waiting for surgery and waiting for surgical date.  She has continued to have heavy to periods of moderate uncontrolled bleeding Since Jan 2024. She is very active and does not want the bleeding to control her life or worry about cancer.  She would like a definitive management and does not want to have more procedures in the future. She reports can bleed >10 days a month. Now it is lighter.   U/S 11/20/22:  normal sized uterus with multiple small fibroids. Endometrial stripe was 3 mm, partially obscured by fibroids. Ovaries were not seen   01/28/23 pap: normal  01/28/23 Endometrial biopsy: benign inactive endometrium  GYNECOLOGIC HISTORY: Patient's last menstrual period was 12/01/2001. Contraception:PMP Menopausal hormone therapy: no        [facility]) (Order [id number]) MyChart Results Release  MyChart Status: Pending  Results Release   Surgical pathology( [place]/ [facility]) Order: [id number] Status: Edited Result - FINAL     Visible to patient: No (inaccessible in MyChart)     Next appt: 05/06/2024 at 01:30 PM in Family Medicine [facility] VISIT)     Dx: Postmenopausal bleeding   4 Result Notes    Component 6 mo ago  SURGICAL PATHOLOGY SURGICAL PATHOLOGY CASE: [id number] PATIENT: Cleon Halberstadt Surgical Pathology Report     Clinical History: PMB (cm)     FINAL MICROSCOPIC DIAGNOSIS:  A. ENDOMETRIUM, BIOPSY: - Benign inactive endometrium - Negative for hyperplasia or malignancy        OB History     Gravida  2   Para  2   Term  1   Preterm  1   AB      Living  3      SAB      IAB      Ectopic      Multiple  1   Live Births  3              Patient Active Problem List    Diagnosis Date Noted   Vaginal bleeding 11/19/2022   Alcohol abuse 05/17/2022   HLD (hyperlipidemia) 05/17/2022   Sciatica associated with disorder of lumbar spine 09/01/2016   Abnormal gall bladder diagnostic imaging 10/24/2015   Right hip pain 09/19/2015   Hepatic cirrhosis (HCC) 07/18/2015   Tobacco use 03/03/2012   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   History of rectal fissure    Hypertension    Normal cardiac stress test 11/10/2006   [name]   Normal echocardiogram 08/19/2007    Past Surgical History:  Procedure Laterality Date   ANAL FISSURE REPAIR  CARPAL TUNNEL RELEASE Bilateral    DILATION AND CURETTAGE OF UTERUS     laparotomy salpingectomy     ectopic pregnancy   ROTATOR CUFF REPAIR Right    x 2   TUBAL LIGATION      Current Outpatient Medications  Medication Sig Dispense Refill   amLODipine (NORVASC) 10 MG tablet TAKE 1 TABLET BY MOUTH EVERY DAY 90 tablet 2   atorvastatin (LIPITOR) 40 MG tablet TAKE 1 TABLET BY MOUTH EVERY DAY (Patient taking differently: Take 40 mg by mouth every other day.) 90 tablet 3   Cholecalciferol (VITAMIN D-3) 1000 UNITS CAPS Take 1 capsule (1,000 Units total) by mouth daily. 30 capsule 6   diclofenac Sodium (VOLTAREN ARTHRITIS PAIN) 1 % GEL Apply to back up to four times a day 100 g 3   hydrOXYzine (ATARAX) 25 MG tablet Take 25 mg by mouth 3 (three) times daily as needed.     QUEtiapine (SEROQUEL) 50  MG tablet TAKE 3 TABS BY MOUTH BEFORE BED 270 tablet 0   sertraline (ZOLOFT) 100 MG tablet Take 1 tablet (100 mg total) by mouth daily. 90 tablet 1   traMADol (ULTRAM) 50 MG tablet Take 1 tablet (50 mg total) by mouth every 12 (twelve) hours as needed. 30 tablet 3   triamterene-hydrochlorothiazide (MAXZIDE-25) 37.5-25 MG tablet TAKE 1 TABLET BY MOUTH EVERY DAY 90 tablet 1   vitamin B-12 (CYANOCOBALAMIN) 500 MCG tablet Take 500 mcg by mouth daily.     Zoster Vaccine Adjuvanted [facility]) injection Inject 0.5 ml IM and Repeat in 2 months 0.5 mL 1   No current facility-administered medications for this visit.     ALLERGIES: Aspirin  Family History  Problem Relation Age of Onset   Breast cancer Mother        died age 60   Colon cancer Mother    Arthritis Mother    Alzheimer's disease Mother    Coronary artery disease Father        died age 57   Stomach cancer Father    Arthritis Father    Stroke Sister    Heart failure Sister    Hypertension Sister    Arthritis Maternal Grandmother    Arthritis Paternal Grandmother    Breast cancer Paternal Grandmother    Alzheimer's disease Maternal Aunt        x5   Breast cancer Other        PGA    Social History   Socioeconomic History   Marital status: Widowed    Spouse name: Orlene Erm    Number of children: 2   Years of education: 15   Highest education level: Not on file  Occupational History   Occupation: DISABLED    Employer: UNEMPLOYED  Tobacco Use   Smoking status: Every Day    Current packs/day: 0.25    Types: Cigarettes   Smokeless tobacco: Never   Tobacco comments:    2-3 cigs a day  Vaping Use   Vaping status: Never Used  Substance and Sexual Activity   Alcohol use: Not Currently    Comment: glass of wine occasionally   Drug use: No   Sexual activity: Not Currently    Partners: Male    Birth control/protection: Post-menopausal  Other Topics Concern   Not on file  Social History Narrative   Lives with her  husband and her Chow dog.    Social Determinants of Health   Financial Resource Strain: Low Risk  (05/04/2023)   Overall  Financial Resource Strain (CARDIA)    Difficulty of Paying Living Expenses: Not hard at all  Food Insecurity: No Food Insecurity (05/04/2023)   Hunger Vital Sign    Worried About Running Out of Food in the Last Year: Never true    Ran Out of Food in the Last Year: Never true  Transportation Needs: No Transportation Needs (05/04/2023)   PRAPARE - Administrator, Civil Service (Medical): No    Lack of Transportation (Non-Medical): No  Physical Activity: Inactive (05/04/2023)   Exercise Vital Sign    Days of Exercise per Week: 0 days    Minutes of Exercise per Session: 0 min  Stress: No Stress Concern Present (05/04/2023)   [employer] of Occupational Health - Occupational Stress Questionnaire    Feeling of Stress : Not at all  Social Connections: Moderately Integrated (05/04/2023)   Social Connection and Isolation Panel [NHANES]    Frequency of Communication with Friends and Family: More than three times a week    Frequency of Social Gatherings with Friends and Family: Three times a week    Attends Religious Services: More than 4 times per year    Active Member of Clubs or Organizations: No    Attends Banker Meetings: Never    Marital Status: Married  Catering manager Violence: Not At Risk (05/04/2023)   Humiliation, Afraid, Rape, and Kick questionnaire    Fear of Current or Ex-Partner: No    Emotionally Abused: No    Physically Abused: No    Sexually Abused: No    ROS   Pelvic ultrasound  Indications: PMP bleeding  Findings:  Uterus 6.57 x 5.45 x 3.17 cm, anteverted Multiple mall intramural myomas, 6 myomas measured, largest was 1.97 cm  Endometrium 2.24 mm, supoptimal visualization  Left ovary 1.54 x 0.9 x 1.27 cm  Right ovary not seen   No free fluid  Sonohysterogram The procedure and risks of the procedure were  reviewed with the patient, consent form was signed. A speculum was placed in the vagina and the cervix was cleansed with betadine. The sonohysterogram catheter was inserted into the uterine cavity without difficulty. Saline was infused under direct observation with the ultrasound. She appeared to have a large polyp. The cavity didn't distend well, but intracavitary mass was noted to be moving with instillation of fluid. The patient was uncomfortable during the procedure and visualization was also somewhat limited from intramural fibroids. No intracavitary fibroids noted.The catheter was removed.   Impression:  Small anteverted uterus with multiple intracavitary fibroids Endometrial stripe not well visualized Right ovary not seen Left ovary atrophic Sonohysterogram with ~1.1 x 0.5 cm intracavitary defect c/w a polyp  Chaperone, Carolynn Serve, CMA was present for exam.   1. Post-menopausal bleeding Negative biopsy, normal pap. Sonohysterogram c/w polyp Patient with persistent bleeding.  Offered aygestin to help control bleeding until surgery.  She would like to wait on any hormones. Discussed in detail with r/b/a/I of myosure D&C vs. RLH.  Discussed with the D&C, she may still have bleeding in the future and need for additional procedure. She would like to have a definitive management and have the [facility] asap.  The bleeding has caused her much anxiety and affects her quality of her life.    Counseled extensively on the procedure including but not limited to what to expect and risks and benefits.  Counseled on postop care and pelvic rest for 10 weeks after the surgery with restricted lifting for 6 weeks after.  Counseled on the benefits of the robotic procedure with faster return to daily activities, improved outcomes, and less risk for complications.  She may need to stay overnight due to not having any family members.  A request was sent. 30 minutes spent on reviewing records, imaging,  and one on one  patient time and counseling patient and documentation Dr. Karma Greaser

## 2023-08-26 ENCOUNTER — Other Ambulatory Visit: Payer: Self-pay | Admitting: Family Medicine

## 2023-08-26 DIAGNOSIS — Z0000 Encounter for general adult medical examination without abnormal findings: Secondary | ICD-10-CM

## 2023-08-27 ENCOUNTER — Ambulatory Visit: Payer: Medicare HMO

## 2023-08-27 ENCOUNTER — Other Ambulatory Visit: Payer: Self-pay | Admitting: Family Medicine

## 2023-08-28 ENCOUNTER — Encounter (HOSPITAL_BASED_OUTPATIENT_CLINIC_OR_DEPARTMENT_OTHER): Payer: Self-pay | Admitting: Obstetrics and Gynecology

## 2023-08-28 ENCOUNTER — Other Ambulatory Visit: Payer: Self-pay

## 2023-08-28 DIAGNOSIS — Z01818 Encounter for other preprocedural examination: Secondary | ICD-10-CM | POA: Diagnosis not present

## 2023-08-28 DIAGNOSIS — R9431 Abnormal electrocardiogram [ECG] [EKG]: Secondary | ICD-10-CM | POA: Diagnosis not present

## 2023-08-28 DIAGNOSIS — Z01810 Encounter for preprocedural cardiovascular examination: Secondary | ICD-10-CM | POA: Diagnosis present

## 2023-08-28 DIAGNOSIS — Z01812 Encounter for preprocedural laboratory examination: Secondary | ICD-10-CM | POA: Diagnosis present

## 2023-08-28 NOTE — Progress Notes (Addendum)
Spoke w/ via phone for pre-op interview---[facility] Lab needs dos----none         Lab results------09/02/23 lab appt for cbc, bmp, type & screen COVID test -----patient states asymptomatic no test needed Arrive at -------0730 on Tuesday, 09/08/23 (Dr. Bonney Roussel patients to come in one hour apart.) NPO after MN NO Solid Food.  Clear liquids from MN until---0630 Med rec completed Medications to take morning of surgery -----Amlodipine, Zoloft, Lipitor, Hydroxyzine prn, Tramadol prn Diabetic medication -----n/a Patient instructed no nail polish to be worn day of surgery Patient instructed to bring photo id and insurance card day of surgery Patient aware to have Driver (ride ) / caregiver    for 24 hours after surgery - friend (Patient will give name of friend on day of surgery.) Patient Special Instructions -----Extended / overnight stay instructions given. Pre-Op special Instructions -----Patient ambulates with cane if she is having an arthritis or sciatica flare. She is aware that she must remove full dentures before going to the OR. She was instructed to do her best not to smoke for 24 hours prior to surgery as well as no alcohol for 24 hours. Patient verbalized understanding of instructions that were given at this phone interview. Patient denies chest pain, sob, fever, cough at the interview.

## 2023-08-28 NOTE — Progress Notes (Addendum)
Your procedure is scheduled on Tuesday, 09/08/23.  Report to [facility] AT  7:30 AM.   Call this number if you have problems the morning of surgery  :[phone].   OUR ADDRESS IS [address].  WE ARE LOCATED IN [facility].  PLEASE BRING YOUR INSURANCE CARD AND PHOTO ID DAY OF SURGERY.  ONLY 2 PEOPLE ARE ALLOWED IN  WAITING  ROOM                                      REMEMBER:  DO NOT EAT FOOD, CANDY GUM OR MINTS  AFTER MIDNIGHT THE NIGHT BEFORE YOUR SURGERY . YOU MAY HAVE CLEAR LIQUIDS FROM MIDNIGHT THE NIGHT BEFORE YOUR SURGERY UNTIL  6:30 AM. NO CLEAR LIQUIDS AFTER   6:30 AM DAY OF SURGERY.  YOU MAY  BRUSH YOUR TEETH MORNING OF SURGERY AND RINSE YOUR MOUTH OUT, NO CHEWING GUM CANDY OR MINTS.     CLEAR LIQUID DIET    Allowed      Water                                                                   Coffee and tea, regular and decaf  (NO cream or milk products of any type, may sweeten)                         Carbonated beverages, regular and diet                                    Sports drinks like Gatorade _____________________________________________________________________     TAKE ONLY THESE MEDICATIONS MORNING OF SURGERY: Amlodipine, Zoloft, Lipitor, Hydroxyzine if needed, Tramadol if needed                                        DO NOT WEAR JEWERLY/  METAL/  PIERCINGS (INCLUDING NO PLASTIC PIERCINGS) DO NOT WEAR LOTIONS, POWDERS, PERFUMES OR NAIL POLISH ON YOUR FINGERNAILS. TOENAIL POLISH IS OK TO WEAR. DO NOT SHAVE FOR 48 HOURS PRIOR TO DAY OF SURGERY.  CONTACTS, GLASSES, OR DENTURES MAY NOT BE WORN TO SURGERY.  REMEMBER: NO SMOKING, VAPING ,  DRUGS OR ALCOHOL FOR 24 HOURS BEFORE YOUR SURGERY.                                    [facility] IS NOT RESPONSIBLE  FOR ANY BELONGINGS.                                                                    Mar[facility] Kitchen  [facility] - Preparing for Surgery Before surgery,  you can play an important role.  Because skin is not sterile, your skin needs to be as free of germs as possible.  You can reduce the number of germs on your skin by washing with CHG (chlorahexidine gluconate) soap before surgery.  CHG is an antiseptic cleaner which kills germs and bonds with the skin to continue killing germs even after washing. Please DO NOT use if you have an allergy to CHG or antibacterial soaps.  If your skin becomes reddened/irritated stop using the CHG and inform your nurse when you arrive at Short Stay. Do not shave (including legs and underarms) for at least 48 hours prior to the first CHG shower.  You may shave your face/neck. Please follow these instructions carefully:  1.  Shower with CHG Soap the night before surgery and the  morning of Surgery.  2.  If you choose to wash your hair, wash your hair first as usual with your  normal  shampoo.  3.  After you shampoo, rinse your hair and body thoroughly to remove the  shampoo.                                        4.  Use CHG as you would any other liquid soap.  You can apply chg directly  to the skin and wash , chg soap provided, night before and morning of your surgery.  5.  Apply the CHG Soap to your body ONLY FROM THE NECK DOWN.   Do not use on face/ open                           Wound or open sores. Avoid contact with eyes, ears mouth and genitals (private parts).                       Wash face,  Genitals (private parts) with your normal soap.             6.  Wash thoroughly, paying special attention to the area where your surgery  will be performed.  7.  Thoroughly rinse your body with warm water from the neck down.  8.  DO NOT shower/wash with your normal soap after using and rinsing off  the CHG Soap.             9.  Pat yourself dry with a clean towel.            10.  Wear clean pajamas.            11.  Place clean sheets on your bed the night of your first shower and do not  sleep with pets. Day of Surgery : Do  not apply any lotions/ powders the morning of surgery.  Please wear clean clothes to the hospital/surgery center.  IF YOU HAVE ANY SKIN IRRITATION OR PROBLEMS WITH THE SURGICAL SOAP, PLEASE GET A BAR OF GOLD DIAL SOAP AND SHOWER THE NIGHT BEFORE YOUR SURGERY AND THE MORNING OF YOUR SURGERY. PLEASE LET THE NURSE KNOW MORNING OF YOUR SURGERY IF YOU HAD ANY PROBLEMS WITH THE SURGICAL SOAP.   YOUR SURGEON MAY HAVE REQUESTED EXTENDED RECOVERY TIME AFTER YOUR SURGERY. IT COULD BE A  JUST A FEW HOURS  UP TO AN OVERNIGHT STAY.  YOUR SURGEON SHOULD HAVE  DISCUSSED THIS WITH YOU PRIOR TO YOUR SURGERY. IN THE EVENT YOU NEED TO STAY OVERNIGHT PLEASE REFER TO THE FOLLOWING GUIDELINES. YOU MAY HAVE UP TO 4 VISITORS  MAY VISIT IN THE EXTENDED RECOVERY ROOM UNTIL 800 PM ONLY.  ONE  VISITOR AGE 49 AND OVER MAY SPEND THE NIGHT AND MUST BE IN EXTENDED RECOVERY ROOM NO LATER THAN 800 PM . YOUR DISCHARGE TIME AFTER YOU SPEND THE NIGHT IS 900 AM THE MORNING AFTER YOUR SURGERY. YOU MAY PACK A SMALL OVERNIGHT BAG WITH TOILETRIES FOR YOUR OVERNIGHT STAY IF YOU WISH.  REGARDLESS OF IF YOU STAY OVER NIGHT OR ARE DISCHARGED THE SAME DAY YOU WILL BE REQUIRED TO HAVE A RESPONSIBLE ADULT (18 YRS OLD OR OLDER) STAY WITH YOU FOR AT LEAST THE FIRST 24 HOURS  YOUR PRESCRIPTION MEDICATIONS WILL BE PROVIDED DURING YOUR HOSPITAL STAY.  ________________________________________________________________________                                                        QUESTIONS Mechele Claude PRE OP NURSE PHONE [phone].

## 2023-09-02 ENCOUNTER — Encounter (HOSPITAL_COMMUNITY)
Admission: RE | Admit: 2023-09-02 | Discharge: 2023-09-02 | Disposition: A | Discharge status: Home / Self Care | Payer: Medicare HMO | Source: Ambulatory Visit | Attending: Obstetrics and Gynecology | Admitting: Obstetrics and Gynecology

## 2023-09-02 ENCOUNTER — Other Ambulatory Visit (HOSPITAL_COMMUNITY): Payer: Medicare HMO

## 2023-09-02 DIAGNOSIS — Z01818 Encounter for other preprocedural examination: Secondary | ICD-10-CM | POA: Diagnosis not present

## 2023-09-02 DIAGNOSIS — R9431 Abnormal electrocardiogram [ECG] [EKG]: Secondary | ICD-10-CM | POA: Diagnosis not present

## 2023-09-02 LAB — BASIC METABOLIC PANEL
Anion gap: 12 (ref 5–15)
BUN: 14 mg/dL (ref 8–23)
CO2: 22 mmol/L (ref 22–32)
Calcium: 9.6 mg/dL (ref 8.9–10.3)
Chloride: 105 mmol/L (ref 98–111)
Creatinine, Ser: 0.71 mg/dL (ref 0.44–1.00)
GFR, Estimated: >60 mL/min (ref >60)
Glucose, Bld: 116 mg/dL — ABNORMAL HIGH (ref 70–99)
Potassium: 3.3 mmol/L — ABNORMAL LOW (ref 3.5–5.1)
Sodium: 139 mmol/L (ref 135–145)

## 2023-09-02 LAB — CBC
HCT: 39.3 % (ref 36.0–46.0)
Hemoglobin: 13.2 g/dL (ref 12.0–15.0)
MCH: 30.4 pg (ref 26.0–34.0)
MCHC: 33.6 g/dL (ref 30.0–36.0)
MCV: 90.6 fL (ref 80.0–100.0)
Platelets: 211 10*3/uL (ref 150–400)
RBC: 4.34 MIL/uL (ref 3.87–5.11)
RDW: 13.1 % (ref 11.5–15.5)
WBC: 7.3 10*3/uL (ref 4.0–10.5)
nRBC: 0 % (ref 0.0–0.2)

## 2023-09-03 ENCOUNTER — Other Ambulatory Visit: Payer: Self-pay

## 2023-09-03 DIAGNOSIS — M5386 Other specified dorsopathies, lumbar region: Secondary | ICD-10-CM

## 2023-09-03 MED ORDER — TRAMADOL HCL 50 MG PO TABS: 50.0000 mg | ORAL_TABLET | Freq: Two times a day (BID) | ORAL | 1 refills | Status: DC | PRN

## 2023-09-08 ENCOUNTER — Other Ambulatory Visit: Payer: Self-pay

## 2023-09-08 ENCOUNTER — Ambulatory Visit (HOSPITAL_BASED_OUTPATIENT_CLINIC_OR_DEPARTMENT_OTHER)
Admission: RE | Admit: 2023-09-08 | Discharge: 2023-09-09 | Disposition: A | Discharge status: Home / Self Care | Payer: Medicare HMO | Source: Ambulatory Visit | Attending: Obstetrics and Gynecology | Admitting: Obstetrics and Gynecology

## 2023-09-08 ENCOUNTER — Ambulatory Visit (HOSPITAL_BASED_OUTPATIENT_CLINIC_OR_DEPARTMENT_OTHER): Payer: Medicare HMO | Admitting: Certified Registered Nurse Anesthetist

## 2023-09-08 ENCOUNTER — Encounter (HOSPITAL_BASED_OUTPATIENT_CLINIC_OR_DEPARTMENT_OTHER)
Admission: RE | Disposition: A | Discharge status: Home / Self Care | Payer: Self-pay | Source: Ambulatory Visit | Attending: Obstetrics and Gynecology

## 2023-09-08 ENCOUNTER — Encounter (HOSPITAL_BASED_OUTPATIENT_CLINIC_OR_DEPARTMENT_OTHER): Payer: Self-pay | Admitting: Obstetrics and Gynecology

## 2023-09-08 DIAGNOSIS — Z79899 Other long term (current) drug therapy: Secondary | ICD-10-CM | POA: Insufficient documentation

## 2023-09-08 DIAGNOSIS — N858 Other specified noninflammatory disorders of uterus: Secondary | ICD-10-CM | POA: Diagnosis not present

## 2023-09-08 DIAGNOSIS — F32A Depression, unspecified: Secondary | ICD-10-CM | POA: Diagnosis not present

## 2023-09-08 DIAGNOSIS — D259 Leiomyoma of uterus, unspecified: Secondary | ICD-10-CM | POA: Diagnosis not present

## 2023-09-08 DIAGNOSIS — E785 Hyperlipidemia, unspecified: Secondary | ICD-10-CM | POA: Diagnosis not present

## 2023-09-08 DIAGNOSIS — I10 Essential (primary) hypertension: Secondary | ICD-10-CM | POA: Diagnosis not present

## 2023-09-08 DIAGNOSIS — K7460 Unspecified cirrhosis of liver: Secondary | ICD-10-CM | POA: Insufficient documentation

## 2023-09-08 DIAGNOSIS — F413 Other mixed anxiety disorders: Secondary | ICD-10-CM | POA: Diagnosis not present

## 2023-09-08 DIAGNOSIS — N840 Polyp of corpus uteri: Secondary | ICD-10-CM | POA: Insufficient documentation

## 2023-09-08 DIAGNOSIS — K660 Peritoneal adhesions (postprocedural) (postinfection): Secondary | ICD-10-CM | POA: Insufficient documentation

## 2023-09-08 DIAGNOSIS — N950 Postmenopausal bleeding: Secondary | ICD-10-CM | POA: Insufficient documentation

## 2023-09-08 DIAGNOSIS — F17210 Nicotine dependence, cigarettes, uncomplicated: Secondary | ICD-10-CM | POA: Insufficient documentation

## 2023-09-08 DIAGNOSIS — D251 Intramural leiomyoma of uterus: Secondary | ICD-10-CM | POA: Insufficient documentation

## 2023-09-08 DIAGNOSIS — N9489 Other specified conditions associated with female genital organs and menstrual cycle: Secondary | ICD-10-CM | POA: Insufficient documentation

## 2023-09-08 DIAGNOSIS — D649 Anemia, unspecified: Secondary | ICD-10-CM | POA: Diagnosis not present

## 2023-09-08 DIAGNOSIS — E7800 Pure hypercholesterolemia, unspecified: Secondary | ICD-10-CM | POA: Diagnosis not present

## 2023-09-08 DIAGNOSIS — Z01818 Encounter for other preprocedural examination: Secondary | ICD-10-CM

## 2023-09-08 DIAGNOSIS — N72 Inflammatory disease of cervix uteri: Secondary | ICD-10-CM | POA: Diagnosis not present

## 2023-09-08 DIAGNOSIS — F419 Anxiety disorder, unspecified: Secondary | ICD-10-CM | POA: Insufficient documentation

## 2023-09-08 DIAGNOSIS — K219 Gastro-esophageal reflux disease without esophagitis: Secondary | ICD-10-CM | POA: Diagnosis not present

## 2023-09-08 DIAGNOSIS — F418 Other specified anxiety disorders: Secondary | ICD-10-CM | POA: Diagnosis not present

## 2023-09-08 HISTORY — PX: CYSTOSCOPY: SHX5120

## 2023-09-08 HISTORY — DX: Myoneural disorder, unspecified: G70.9

## 2023-09-08 HISTORY — DX: Anemia, unspecified: D64.9

## 2023-09-08 HISTORY — DX: Insomnia, unspecified: G47.00

## 2023-09-08 HISTORY — DX: Presence of spectacles and contact lenses: Z97.3

## 2023-09-08 HISTORY — DX: Presence of dental prosthetic device (complete) (partial): Z97.2

## 2023-09-08 HISTORY — PX: ROBOTIC ASSISTED TOTAL HYSTERECTOMY WITH BILATERAL SALPINGO OOPHERECTOMY: SHX6086

## 2023-09-08 HISTORY — DX: Dependence on other enabling machines and devices: Z99.89

## 2023-09-08 LAB — TYPE AND SCREEN
ABO/RH(D): A POS
Antibody Screen: NEGATIVE

## 2023-09-08 SURGERY — HYSTERECTOMY, TOTAL, ROBOT-ASSISTED, LAPAROSCOPIC, WITH BILATERAL SALPINGO-OOPHORECTOMY
Anesthesia: General | Site: Urethra

## 2023-09-08 MED ORDER — OXYCODONE HCL 5 MG PO TABS
ORAL_TABLET | ORAL | Status: AC
  Filled 2023-09-08: qty 1

## 2023-09-08 MED ORDER — HYDROMORPHONE HCL 1 MG/ML IJ SOLN
INTRAMUSCULAR | Status: AC
  Filled 2023-09-08: qty 1

## 2023-09-08 MED ORDER — SOD CITRATE-CITRIC ACID 500-334 MG/5ML PO SOLN: 30.0000 mL | ORAL | Status: AC

## 2023-09-08 MED ORDER — ONDANSETRON HCL 4 MG/2ML IJ SOLN
4.0000 mg | Freq: Four times a day (QID) | INTRAMUSCULAR | Status: DC | PRN
  Administered 2023-09-08: 4 mg via INTRAVENOUS

## 2023-09-08 MED ORDER — PANTOPRAZOLE SODIUM 40 MG IV SOLR
40.0000 mg | Freq: Every day | INTRAVENOUS | Status: DC
  Administered 2023-09-08: 40 mg via INTRAVENOUS

## 2023-09-08 MED ORDER — PANTOPRAZOLE SODIUM 40 MG IV SOLR
INTRAVENOUS | Status: AC
Start: 2023-09-08 — End: ?
  Filled 2023-09-08: qty 10

## 2023-09-08 MED ORDER — BUPIVACAINE HCL (PF) 0.5 % IJ SOLN
INTRAMUSCULAR | Status: DC | PRN
  Administered 2023-09-08: 20 mL

## 2023-09-08 MED ORDER — SUGAMMADEX SODIUM 200 MG/2ML IV SOLN
INTRAVENOUS | Status: DC | PRN
  Administered 2023-09-08: 200 mg via INTRAVENOUS

## 2023-09-08 MED ORDER — ONDANSETRON HCL 4 MG/2ML IJ SOLN: 4.0000 mg | Freq: Once | INTRAMUSCULAR | Status: DC | PRN

## 2023-09-08 MED ORDER — DEXAMETHASONE SODIUM PHOSPHATE 10 MG/ML IJ SOLN
INTRAMUSCULAR | Status: AC
Start: 2023-09-08 — End: ?
  Filled 2023-09-08: qty 1

## 2023-09-08 MED ORDER — SODIUM CHLORIDE 0.9 % IV SOLN
INTRAVENOUS | Status: AC
  Filled 2023-09-08: qty 100

## 2023-09-08 MED ORDER — MAGNESIUM HYDROXIDE 400 MG/5ML PO SUSP: 30.0000 mL | Freq: Every day | ORAL | Status: DC | PRN

## 2023-09-08 MED ORDER — HEMOSTATIC AGENTS (NO CHARGE) OPTIME
TOPICAL | Status: DC | PRN
  Administered 2023-09-08: 1 via TOPICAL

## 2023-09-08 MED ORDER — DEXAMETHASONE SODIUM PHOSPHATE 10 MG/ML IJ SOLN
INTRAMUSCULAR | Status: DC | PRN
  Administered 2023-09-08: 10 mg via INTRAVENOUS

## 2023-09-08 MED ORDER — ROCURONIUM BROMIDE 10 MG/ML (PF) SYRINGE
PREFILLED_SYRINGE | INTRAVENOUS | Status: AC
  Filled 2023-09-08: qty 10

## 2023-09-08 MED ORDER — LIDOCAINE 2% (20 MG/ML) 5 ML SYRINGE
INTRAMUSCULAR | Status: DC | PRN
  Administered 2023-09-08: 100 mg via INTRAVENOUS

## 2023-09-08 MED ORDER — GABAPENTIN 300 MG PO CAPS
ORAL_CAPSULE | ORAL | Status: AC
  Filled 2023-09-08: qty 1

## 2023-09-08 MED ORDER — ACETAMINOPHEN 500 MG PO TABS: 1000.0000 mg | ORAL_TABLET | ORAL | Status: DC

## 2023-09-08 MED ORDER — STERILE WATER FOR IRRIGATION IR SOLN
Status: DC | PRN
  Administered 2023-09-08: 500 mL

## 2023-09-08 MED ORDER — ACETAMINOPHEN 500 MG PO TABS
ORAL_TABLET | ORAL | Status: AC
  Filled 2023-09-08: qty 2

## 2023-09-08 MED ORDER — GABAPENTIN 100 MG PO CAPS
100.0000 mg | ORAL_CAPSULE | Freq: Once | ORAL | Status: DC
Start: 2023-09-08 — End: 2023-09-09

## 2023-09-08 MED ORDER — KETOROLAC TROMETHAMINE 30 MG/ML IJ SOLN
INTRAMUSCULAR | Status: DC | PRN
  Administered 2023-09-08: 30 mg via INTRAVENOUS

## 2023-09-08 MED ORDER — HYDROMORPHONE HCL 1 MG/ML IJ SOLN
0.2500 mg | INTRAMUSCULAR | Status: DC | PRN
  Administered 2023-09-08 (×7): 0.25 mg via INTRAVENOUS

## 2023-09-08 MED ORDER — SODIUM CHLORIDE 0.9 % IV SOLN
Freq: Once | INTRAVENOUS | Status: AC
  Administered 2023-09-08: 1000 mL
  Filled 2023-09-08: qty 10

## 2023-09-08 MED ORDER — HYDROMORPHONE HCL 1 MG/ML IJ SOLN
0.2000 mg | INTRAMUSCULAR | Status: DC | PRN
  Administered 2023-09-08 – 2023-09-09 (×2): 0.6 mg via INTRAVENOUS

## 2023-09-08 MED ORDER — ONDANSETRON HCL 4 MG/2ML IJ SOLN
INTRAMUSCULAR | Status: AC
Start: 2023-09-08 — End: ?
  Filled 2023-09-08: qty 2

## 2023-09-08 MED ORDER — OXYCODONE HCL 5 MG PO TABS: 5.0000 mg | ORAL_TABLET | Freq: Once | ORAL | Status: DC | PRN

## 2023-09-08 MED ORDER — KETOROLAC TROMETHAMINE 30 MG/ML IJ SOLN
INTRAMUSCULAR | Status: AC
Start: 2023-09-08 — End: ?
  Filled 2023-09-08: qty 1

## 2023-09-08 MED ORDER — ONDANSETRON HCL 4 MG/2ML IJ SOLN
INTRAMUSCULAR | Status: DC | PRN
  Administered 2023-09-08: 4 mg via INTRAVENOUS

## 2023-09-08 MED ORDER — GABAPENTIN 300 MG PO CAPS
300.0000 mg | ORAL_CAPSULE | ORAL | Status: AC
  Administered 2023-09-08: 300 mg via ORAL

## 2023-09-08 MED ORDER — DROPERIDOL 2.5 MG/ML IJ SOLN
INTRAMUSCULAR | Status: DC | PRN
  Administered 2023-09-08: .625 mg via INTRAVENOUS

## 2023-09-08 MED ORDER — ONDANSETRON HCL 4 MG/2ML IJ SOLN
INTRAMUSCULAR | Status: AC
  Filled 2023-09-08: qty 2

## 2023-09-08 MED ORDER — POVIDONE-IODINE 10 % EX SWAB: 2.0000 | Freq: Once | CUTANEOUS | Status: DC

## 2023-09-08 MED ORDER — CEFOXITIN SODIUM 2 G IV SOLR
INTRAVENOUS | Status: AC
  Filled 2023-09-08: qty 2

## 2023-09-08 MED ORDER — OXYCODONE HCL 5 MG/5ML PO SOLN: 5.0000 mg | Freq: Once | ORAL | Status: DC | PRN

## 2023-09-08 MED ORDER — FENTANYL CITRATE (PF) 250 MCG/5ML IJ SOLN
INTRAMUSCULAR | Status: DC | PRN
  Administered 2023-09-08 (×3): 50 ug via INTRAVENOUS

## 2023-09-08 MED ORDER — SODIUM CHLORIDE 0.9 % IV SOLN
2.0000 g | INTRAVENOUS | Status: AC
  Administered 2023-09-08: 2 g via INTRAVENOUS

## 2023-09-08 MED ORDER — ONDANSETRON HCL 4 MG PO TABS: 4.0000 mg | ORAL_TABLET | Freq: Four times a day (QID) | ORAL | Status: DC | PRN

## 2023-09-08 MED ORDER — LIDOCAINE HCL (PF) 2 % IJ SOLN
INTRAMUSCULAR | Status: AC
  Filled 2023-09-08: qty 15

## 2023-09-08 MED ORDER — ROCURONIUM BROMIDE 10 MG/ML (PF) SYRINGE
PREFILLED_SYRINGE | INTRAVENOUS | Status: DC | PRN
  Administered 2023-09-08: 60 mg via INTRAVENOUS
  Administered 2023-09-08: 10 mg via INTRAVENOUS

## 2023-09-08 MED ORDER — LACTATED RINGERS IV SOLN: INTRAVENOUS | Status: AC

## 2023-09-08 MED ORDER — OXYCODONE HCL 5 MG PO TABS
5.0000 mg | ORAL_TABLET | ORAL | Status: DC | PRN
  Administered 2023-09-08 (×3): 5 mg via ORAL
  Administered 2023-09-09 (×2): 10 mg via ORAL

## 2023-09-08 MED ORDER — FENTANYL CITRATE (PF) 250 MCG/5ML IJ SOLN
INTRAMUSCULAR | Status: AC
  Filled 2023-09-08: qty 5

## 2023-09-08 MED ORDER — MIDAZOLAM HCL 2 MG/2ML IJ SOLN
INTRAMUSCULAR | Status: DC | PRN
  Administered 2023-09-08: 2 mg via INTRAVENOUS

## 2023-09-08 MED ORDER — MIDAZOLAM HCL 2 MG/2ML IJ SOLN
INTRAMUSCULAR | Status: AC
  Filled 2023-09-08: qty 2

## 2023-09-08 MED ORDER — LACTATED RINGERS IV SOLN: INTRAVENOUS | Status: DC | PRN

## 2023-09-08 MED ORDER — PROPOFOL 10 MG/ML IV BOLUS
INTRAVENOUS | Status: AC
  Filled 2023-09-08: qty 20

## 2023-09-08 MED ORDER — PROPOFOL 10 MG/ML IV BOLUS
INTRAVENOUS | Status: DC | PRN
  Administered 2023-09-08: 130 mg via INTRAVENOUS

## 2023-09-08 SURGICAL SUPPLY — 54 items
ADH SKN CLS APL DERMABOND .7 (GAUZE/BANDAGES/DRESSINGS) ×2
APL SRG 38 LTWT LNG FL B (MISCELLANEOUS) ×2
APPLICATOR ARISTA FLEXITIP XL (MISCELLANEOUS) IMPLANT
CATH FOLEY 2WAY SLVR 5CC 14FR (CATHETERS) IMPLANT
CATH FOLEY 3WAY 5CC 16FR (CATHETERS) ×2 IMPLANT
COVER BACK TABLE 60X90IN (DRAPES) ×2 IMPLANT
COVER TIP SHEARS 8 DVNC (MISCELLANEOUS) ×2 IMPLANT
DEFOGGER SCOPE WARMER CLEARIFY (MISCELLANEOUS) ×2 IMPLANT
DERMABOND ADVANCED .7 DNX12 (GAUZE/BANDAGES/DRESSINGS) ×2 IMPLANT
DRAPE ARM DVNC X/XI (DISPOSABLE) ×8 IMPLANT
DRAPE COLUMN DVNC XI (DISPOSABLE) ×2 IMPLANT
DRAPE UTILITY XL STRL (DRAPES) ×2 IMPLANT
DRIVER NDL MEGA SUTCUT DVNCXI (INSTRUMENTS) IMPLANT
DRIVER NDLE MEGA SUTCUT DVNCXI (INSTRUMENTS) ×2
DURAPREP 26ML APPLICATOR (WOUND CARE) ×2 IMPLANT
ELECT REM PT RETURN 9FT ADLT (ELECTROSURGICAL) ×2
ELECTRODE REM PT RTRN 9FT ADLT (ELECTROSURGICAL) ×2 IMPLANT
FORCEPS PROGRASP DVNC XI (FORCEP) IMPLANT
GAUZE 4X4 16PLY ~~LOC~~+RFID DBL (SPONGE) IMPLANT
GLOVE BIO SURGEON STRL SZ7 (GLOVE) IMPLANT
GLOVE BIOGEL PI IND STRL 7.0 (GLOVE) IMPLANT
GLOVE BIOGEL PI IND STRL 7.5 (GLOVE) IMPLANT
GLOVE NEODERM STER SZ 7 (GLOVE) ×6 IMPLANT
GLOVE SURG SS PI 6.5 STRL IVOR (GLOVE) IMPLANT
GLOVE SURG SS PI 7.0 STRL IVOR (GLOVE) IMPLANT
GYRUS RUMI II 3.5CM BLUE (DISPOSABLE) ×2
HEMOSTAT ARISTA ABSORB 3G PWDR (HEMOSTASIS) IMPLANT
HIBICLENS CHG 4% 4OZ BTL (MISCELLANEOUS) IMPLANT
IRRIG SUCT STRYKERFLOW 2 WTIP (MISCELLANEOUS) ×2
IRRIGATION SUCT STRKRFLW 2 WTP (MISCELLANEOUS) ×2 IMPLANT
KIT PINK PAD W/HEAD ARE REST (MISCELLANEOUS) ×2
KIT PINK PAD W/HEAD ARM REST (MISCELLANEOUS) ×2 IMPLANT
KIT TURNOVER CYSTO (KITS) ×2 IMPLANT
LEGGING LITHOTOMY PAIR STRL (DRAPES) ×2 IMPLANT
MANIFOLD NEPTUNE II (INSTRUMENTS) ×2 IMPLANT
OBTURATOR OPTICAL STND 8 DVNC (TROCAR) ×2
OBTURATOR OPTICALSTD 8 DVNC (TROCAR) ×2 IMPLANT
PACK ROBOT WH (CUSTOM PROCEDURE TRAY) ×2 IMPLANT
PACK ROBOTIC GOWN (GOWN DISPOSABLE) ×2 IMPLANT
PAD OB MATERNITY 4.3X12.25 (PERSONAL CARE ITEMS) ×2 IMPLANT
RUMI II GYRUS 3.5CM BLUE (DISPOSABLE) IMPLANT
SCISSORS MNPLR CVD DVNC XI (INSTRUMENTS) IMPLANT
SEAL UNIV 5-12 XI (MISCELLANEOUS) ×6 IMPLANT
SEALER VESSEL EXT DVNC XI (MISCELLANEOUS) IMPLANT
SET IRRIG Y TYPE TUR BLADDER L (SET/KITS/TRAYS/PACK) ×2 IMPLANT
SET TUBE SMOKE EVAC HIGH FLOW (TUBING) ×2 IMPLANT
SOL PREP POV-IOD 4OZ 10% (MISCELLANEOUS) IMPLANT
SPIKE FLUID TRANSFER (MISCELLANEOUS) ×2 IMPLANT
SUT MNCRL AB 4-0 PS2 18 (SUTURE) ×2 IMPLANT
SUT VLOC 180 0 9IN GS21 (SUTURE) ×2 IMPLANT
TIP UTERINE 6.7X6CM WHT DISP (MISCELLANEOUS) IMPLANT
TOWEL OR 17X24 6PK STRL BLUE (TOWEL DISPOSABLE) ×2 IMPLANT
UNDERPAD 30X36 HEAVY ABSORB (UNDERPADS AND DIAPERS) ×2 IMPLANT
WATER STERILE IRR 500ML POUR (IV SOLUTION) ×2 IMPLANT

## 2023-09-08 NOTE — Anesthesia Preprocedure Evaluation (Signed)
Anesthesia Evaluation  Patient identified by MRN, date of birth, ID band Patient awake    Reviewed: Allergy & Precautions, H&P , NPO status , Patient's Chart, lab work & pertinent test results  Airway Mallampati: I  TM Distance: >3 FB Neck ROM: Full    Dental  (+) Upper Dentures, Lower Dentures   Pulmonary Current Smoker and Patient abstained from smoking.   Pulmonary exam normal breath sounds clear to auscultation       Cardiovascular hypertension, Pt. on medications Normal cardiovascular exam Rhythm:Regular Rate:Normal     Neuro/Psych   Anxiety Depression    negative neurological ROS     GI/Hepatic ,GERD  ,,(+) Cirrhosis         Endo/Other  negative endocrine ROS    Renal/GU negative Renal ROS  negative genitourinary   Musculoskeletal negative musculoskeletal ROS (+)    Abdominal   Peds negative pediatric ROS (+)  Hematology negative hematology ROS (+)   Anesthesia Other Findings   Reproductive/Obstetrics negative OB ROS                             Anesthesia Physical Anesthesia Plan  ASA: 3  Anesthesia Plan: General   Post-op Pain Management: Gabapentin PO (pre-op)*   Induction: Intravenous  PONV Risk Score and Plan: 2 and Ondansetron, Dexamethasone, Droperidol and Treatment may vary due to age or medical condition  Airway Management Planned: Oral ETT  Additional Equipment:   Intra-op Plan:   Post-operative Plan: Extubation in OR  Informed Consent: I have reviewed the patients History and Physical, chart, labs and discussed the procedure including the risks, benefits and alternatives for the proposed anesthesia with the patient or authorized representative who has indicated his/her understanding and acceptance.     Dental advisory given  Plan Discussed with: CRNA and Surgeon  Anesthesia Plan Comments:        Anesthesia Quick Evaluation

## 2023-09-08 NOTE — Plan of Care (Signed)
 [facility] Tonsillectomy/Adenoidectomy, Postoperative PEDS care plan entered in error.

## 2023-09-08 NOTE — Progress Notes (Signed)
Attempted to call Rinaldo Cloud to let her know Ms Bula did great in surgery. There was no answer Tried twice Dr. Karma Greaser

## 2023-09-08 NOTE — Anesthesia Procedure Notes (Signed)
Procedure Name: Intubation Date/Time: 09/08/2023 11:56 AM  Performed by: Dairl Ponder, CRNAPre-anesthesia Checklist: Patient identified, Emergency Drugs available, Suction available and Patient being monitored Patient Re-evaluated:Patient Re-evaluated prior to induction Oxygen Delivery Method: Circle System Utilized Preoxygenation: Pre-oxygenation with 100% oxygen Induction Type: IV induction Ventilation: Mask ventilation without difficulty Laryngoscope Size: Mac and 3 Grade View: Grade I Tube type: Oral Tube size: 7.0 mm Number of attempts: 1 Airway Equipment and Method: Stylet and Oral airway Placement Confirmation: ETT inserted through vocal cords under direct vision, positive ETCO2 and breath sounds checked- equal and bilateral Secured at: 22 cm Tube secured with: Tape Dental Injury: Teeth and Oropharynx as per pre-operative assessment

## 2023-09-08 NOTE — Transfer of Care (Signed)
Immediate Anesthesia Transfer of Care Note  Patient: Latoya Swanson  Procedure(s) Performed: XI ROBOTIC ASSISTED TOTAL HYSTERECTOMY WITH BILATERAL SALPINGO OOPHORECTOMY (Bilateral: Abdomen) CYSTOSCOPY (Urethra)  Patient Location: PACU  Anesthesia Type:General  Level of Consciousness: awake, alert , and oriented  Airway & Oxygen Therapy: Patient Spontanous Breathing and Patient connected to nasal cannula oxygen  Post-op Assessment: Report given to RN and Post -op Vital signs reviewed and stable  Post vital signs: Reviewed and stable  Last Vitals:  Vitals Value Taken Time  BP 129/85 09/08/23 1335  Temp 36.6 C 09/08/23 1335  Pulse 64 09/08/23 1344  Resp 12 09/08/23 1344  SpO2 95 % 09/08/23 1344  Vitals shown include unfiled device data.  Last Pain:  Vitals:   09/08/23 1335  TempSrc:   PainSc: 10-Worst pain ever      Patients Stated Pain Goal: 5 (09/08/23 1335)  Complications: No notable events documented.

## 2023-09-08 NOTE — Op Note (Signed)
09/08/2023   [id number]  Latoya Swanson         OPERATIVE REPORT   Preop Diagnosis: postmenopausal bleeding Procedure: robotic hysterectomy, BSO, cystoscopy, LOA   Surgeon: Dr. Orvil Feil Connor Meacham Assistant: Donne Hazel, RN   Fluids: please see anesthesia report   Complications: None Anesthesia: General     Findings:  boggy 7cm uterus, normal ovaries and tubes Cystoscopy at the end of the case with normal bladder and patent ureters bilaterally.  adhesions from the omentum to the uterine fundus without any bowel involvement  Estimated blood loss: Minimal <25cc   Specimens: Uterus, cervix and bilateral tubes and ovaries   Disposition of specimen: Pathology          Patient is taken to the operating room. She is placed in the supine position. She is a running IV in place. Informed consent was present on the chart. SCDs on her lower extremities and functioning properly. Patient was positioned while she was awake.  Her legs were placed in the low lithotomy position in [place] stirrups. Her arms were tucked by the side.  General endotracheal anesthesia was administered by the anesthesia staff without difficulty.       Chlora prep was then used to prep the abdomen and Hibiclens was used to prep the inner thighs, perineum and vagina. Once 3 minutes had past the patient was draped in a normal standard fashion. A proper time out was performed and everyone agreed.  The legs were lifted to the high lithotomy position. A bivalve speculum was inserted into the vagina and the anterior lip of the cervix was grasped with single-tooth tenaculum.  The uterus sounded to  7 cm. Pratt dilators were used to dilate the cervix.  The RUMI uterine manipulator was obtained inserted into the endometrial cavity and the bulb of the disposable tip was inflated with 8 cc of normal saline. There was a good fit of the KOH ring around the cervix. The tenaculum and bivavle speculum was removed. There is  also good manipulation of the uterus.  A Foley catheter was placed to straight drain.  Clear urine was noted. Legs were lowered to the low lithotomy position and attention was turned the abdomen.   Superior to the umbilicus, marcaine 0.25% used to anesthetize the skin.  Using #11 blade, 8mm skin incision was made.  The 8mm robotic trocar and sleeve was inserted under direct visualization.  CO2 gas was  started and patient was placed in trendelenburg position.  Two additional 8mm ports were placed under direct visualization in the left and right lower quadrant.     Ureters were identifies.  Attention was turned to the omentum which was adhered to the fundus.  This was noted to be clear of any bowel.  This was taken down with the vessel sealer with good hemostasis.  Attention was then turned to the left side.  The left IP ligament was noted away from the ureters and this was desiccated and transected with the vessel sealer.  The uterine ovarian pedicle was serially clamped cauterized and incised. Left round ligament was serially clamped cauterized and incised. The anterior and posterior peritoneum of the inferior leaf of the broad ligament were opened. The beginning of the bladder flap was created.  The bladder was taken down below the level of the KOH ring. The left uterine artery skeletonized and then just superior to the KOH ring this vessel was serially clamped, cauterized, and incised.   Attention was turned the right side.  The uterus was placed on stretch to the opposite side.    The right IP ligament was away from the ureter and this was desiccated and transected with the vessel sealer.  Then the right uterine ovarian pedicle was serially clamped cauterized and incised. Next the right round ligament was serially clamped cauterized and incised. The anterior posterior peritoneum of the inferiorly for the broad ligament were opened. The anterior peritoneum was carried across to the dissection on the left  side. The remainder of the bladder flap was created using sharp dissection. The bladder was well below the level of the KOH ring. The right uterine artery skeletonized. Then the right uterine artery, above the level of the KOH ring, was serially clamped cauterized and incised. The uterus was devascularized at this point.   The colpotomy was performed.  This was carried around a circumferential fashion until the vaginal mucosa was completely incised in the specimen was freed.  The specimen was then delivered to the vagina.  A vaginal occlusive device was used to maintain the pneumoperitoneum   Instruments were changed with a needle driver and prograsp.  Using a 9 inch  zero V-lock suture, the cuff was closed by incorporating the anterior and posterior vaginal mucosa in each stitch. This was carried across all the way to the left corner and a running fashion. Two stitches were brought back towards the midline and the suture was cut flush with the vagina. The needle was brought out the pelvis. The pelvis was irrigated. All pedicles were inspected. No bleeding was noted.   CO2 pressures were lowered to 8mm Hg.  Again, no bleeding was noted.  Ureters were noted deep in the pelvis to be peristalsing.  At this point the procedure was completed.  The remaining instruments were removed.  The ports were removed under direct visualization of the laparoscope and the pneumoperitoneum was relieved.   The skin was then closed with subcuticular stitches of 3-0 Vicryl. The skin was cleansed Dermabond was applied. Attention was then turned the vagina and the cuff was inspected. No bleeding was noted.  The Foley catheter was removed.  Cystoscopy was performed.  No sutures or bladder injuries were noted.  Ureters were noted with normal urine jets from each one was seen.  Foley was left out after the cystoscopic fluid was drained and cystoscope removed.  Sponge, lap, needle, instrument counts were correct x2. Patient tolerated the  procedure very well. She was awakened from anesthesia, extubated and taken to recovery in stable condition.      Dr. Karma Greaser

## 2023-09-08 NOTE — Interval H&P Note (Signed)
History and Physical Interval Note:  09/08/2023 11:09 AM  Latoya Swanson  has presented today for surgery, with the diagnosis of postmenopausal bleeding, fibroids, endometrial polyp, anemia.  The various methods of treatment have been discussed with the patient and family. After consideration of risks, benefits and other options for treatment, the patient has consented to  Procedure(s): XI ROBOTIC ASSISTED TOTAL HYSTERECTOMY WITH BILATERAL SALPINGO OOPHORECTOMY (Bilateral) CYSTOSCOPY (N/A) as a surgical intervention.  The patient's history has been reviewed, patient examined, no change in status, stable for surgery.  I have reviewed the patient's chart and labs.  Questions were answered to the patient's satisfaction.     Earley Favor

## 2023-09-09 DIAGNOSIS — D219 Benign neoplasm of connective and other soft tissue, unspecified: Secondary | ICD-10-CM | POA: Diagnosis not present

## 2023-09-09 DIAGNOSIS — I10 Essential (primary) hypertension: Secondary | ICD-10-CM | POA: Diagnosis not present

## 2023-09-09 DIAGNOSIS — D500 Iron deficiency anemia secondary to blood loss (chronic): Secondary | ICD-10-CM | POA: Diagnosis not present

## 2023-09-09 DIAGNOSIS — N840 Polyp of corpus uteri: Secondary | ICD-10-CM | POA: Diagnosis not present

## 2023-09-09 DIAGNOSIS — N950 Postmenopausal bleeding: Secondary | ICD-10-CM

## 2023-09-09 DIAGNOSIS — F17210 Nicotine dependence, cigarettes, uncomplicated: Secondary | ICD-10-CM | POA: Diagnosis not present

## 2023-09-09 DIAGNOSIS — N72 Inflammatory disease of cervix uteri: Secondary | ICD-10-CM | POA: Diagnosis not present

## 2023-09-09 DIAGNOSIS — D251 Intramural leiomyoma of uterus: Secondary | ICD-10-CM | POA: Diagnosis not present

## 2023-09-09 DIAGNOSIS — K660 Peritoneal adhesions (postprocedural) (postinfection): Secondary | ICD-10-CM | POA: Diagnosis not present

## 2023-09-09 DIAGNOSIS — K219 Gastro-esophageal reflux disease without esophagitis: Secondary | ICD-10-CM | POA: Diagnosis not present

## 2023-09-09 DIAGNOSIS — N9489 Other specified conditions associated with female genital organs and menstrual cycle: Secondary | ICD-10-CM | POA: Diagnosis not present

## 2023-09-09 MED ORDER — OXYCODONE HCL 5 MG PO TABS: 5.0000 mg | ORAL_TABLET | ORAL | 0 refills | Status: DC | PRN

## 2023-09-09 MED ORDER — OXYCODONE HCL 5 MG PO TABS
ORAL_TABLET | ORAL | Status: AC
  Filled 2023-09-09: qty 2

## 2023-09-09 MED ORDER — HYDROMORPHONE HCL 1 MG/ML IJ SOLN
INTRAMUSCULAR | Status: AC
  Filled 2023-09-09: qty 1

## 2023-09-09 NOTE — Anesthesia Postprocedure Evaluation (Signed)
Anesthesia Post Note  Patient: Latoya Swanson Sensing  Procedure(s) Performed: XI ROBOTIC ASSISTED TOTAL HYSTERECTOMY WITH BILATERAL SALPINGO OOPHORECTOMY (Bilateral: Abdomen) CYSTOSCOPY (Urethra)     Patient location during evaluation: PACU Anesthesia Type: General Level of consciousness: awake and alert Pain management: pain level controlled Vital Signs Assessment: post-procedure vital signs reviewed and stable Respiratory status: spontaneous breathing, nonlabored ventilation, respiratory function stable and patient connected to nasal cannula oxygen Cardiovascular status: blood pressure returned to baseline and stable Postop Assessment: no apparent nausea or vomiting Anesthetic complications: no  No notable events documented.  Last Vitals:  Vitals:   09/09/23 0442 09/09/23 0811  BP: 139/84 (!) 160/92  Pulse: 76 81  Resp: 16 20  Temp: 37.1 Swanson 36.8 Swanson  SpO2: 95% 95%    Last Pain:  Vitals:   09/09/23 0930  TempSrc:   PainSc: 5    Pain Goal: Patients Stated Pain Goal: 5 (09/09/23 0154)                 Meiko Stranahan L Krista Som

## 2023-09-09 NOTE — Progress Notes (Signed)
GYN PO NOTE  Patient is doing well. No VB Voiding and ambulating. Good pain control  Blood pressure (!) 103/55, pulse 62, temperature 97.9 F (36.6 C), resp. rate 18, height 5\' 3"  (1.6 m), weight 80.8 kg, last menstrual period 08/16/2023, SpO2 99%.  Abdomen: soft, incisions dry with no erythema, approp tender No resp distress  A/p POD#1 RLH,BSO, cysto  doing well  Discharge to home today Dr. Karma Greaser

## 2023-09-10 ENCOUNTER — Encounter (HOSPITAL_BASED_OUTPATIENT_CLINIC_OR_DEPARTMENT_OTHER): Payer: Self-pay | Admitting: Obstetrics and Gynecology

## 2023-09-11 LAB — SURGICAL PATHOLOGY

## 2023-09-16 ENCOUNTER — Other Ambulatory Visit: Payer: Self-pay

## 2023-09-16 NOTE — Telephone Encounter (Signed)
Patient called triage line states she has not slept in 3 days. She says that she spoke with someone yesterday that said Dr Karma Greaser would call her in trazodone. I do not see a phone encounter.  Patient wants to know if you will call in the trazodone for her

## 2023-09-22 ENCOUNTER — Encounter: Payer: Medicare HMO | Admitting: Obstetrics and Gynecology

## 2023-09-23 ENCOUNTER — Encounter: Payer: Self-pay | Admitting: Obstetrics and Gynecology

## 2023-09-23 ENCOUNTER — Ambulatory Visit (INDEPENDENT_AMBULATORY_CARE_PROVIDER_SITE_OTHER): Payer: Medicare HMO | Admitting: Obstetrics and Gynecology

## 2023-09-23 VITALS — BP 110/68 | HR 70

## 2023-09-23 DIAGNOSIS — Z09 Encounter for follow-up examination after completed treatment for conditions other than malignant neoplasm: Secondary | ICD-10-CM

## 2023-09-23 MED ORDER — ZOLPIDEM TARTRATE ER 6.25 MG PO TBCR: 6.2500 mg | EXTENDED_RELEASE_TABLET | Freq: Every evening | ORAL | 2 refills | Status: DC | PRN

## 2023-09-23 NOTE — Progress Notes (Signed)
Patient presents for 2 week postop from [facility], bilateral salpingectomy, cystoscopy. She is doing well. No fevers, VB, dysuria or severe abdominal pain. Does report insomnia.  She would like to try ambien to help  Blood pressure 104/70, pulse 72, weight 184 lb (83.5 kg), last menstrual period 08/12/2023, SpO2 100%.  Abdomen: incisions I/c/d, NT, ND  A/p PO from [facility] 2 weeks doing well Encouraged no heavy lifting, pushing, pulling greater than 10 lbs for full 8 weeks 2. Pelvic rest for the entire 10 wks 3. RTC with any concerns or with heavy bleeding, fevers or severe abdominal pain. 4.  Ambien printed for patient.  Instructions on use given Dr. Karma Greaser

## 2023-10-01 ENCOUNTER — Other Ambulatory Visit: Payer: Self-pay | Admitting: Family Medicine

## 2023-10-01 DIAGNOSIS — G4700 Insomnia, unspecified: Secondary | ICD-10-CM

## 2023-10-02 ENCOUNTER — Encounter: Payer: Self-pay | Admitting: Family Medicine

## 2023-10-07 ENCOUNTER — Other Ambulatory Visit: Payer: Self-pay | Admitting: Family Medicine

## 2023-10-21 ENCOUNTER — Ambulatory Visit: Payer: Medicare HMO | Admitting: Family Medicine

## 2023-10-21 NOTE — Patient Instructions (Incomplete)
    I will really miss working with you - Be Well

## 2023-10-21 NOTE — Progress Notes (Unsigned)
    SUBJECTIVE:   CHIEF COMPLAINT / HPI:   Tobacco No tobacco since May      Back Pain - Muscle Spasms Having crampy pains in back and legs Is physically active around house and garden Not using cane nor walker  No weakness Tylenol makes her blood pressure goes up  NSAIDs hurt her stomach She takes tramadol once to qod  Not currently using diclofenac gel   Mood No longer seeing [facility].  The medication they prescribed made her gain weight.  Overall she feels she is doing much better. Taking seroquel 150 mg some evenings - maybe 3 times a week Taking hydroxyzine 25 mg at least 2 times a day  Taking sertraline 75 mg daily (although did not have this medication bottle today)     Cirrhosis   Hypertension       OBJECTIVE:   LMP 12/01/2001   ***  ASSESSMENT/PLAN:   There are no diagnoses linked to this encounter.   Patient Instructions     I will miss working with you - Be Well    Carney Living, MD [facility]

## 2023-10-22 ENCOUNTER — Encounter: Payer: Self-pay | Admitting: *Deleted

## 2023-11-01 ENCOUNTER — Other Ambulatory Visit: Payer: Self-pay | Admitting: Family Medicine

## 2023-11-01 DIAGNOSIS — G4700 Insomnia, unspecified: Secondary | ICD-10-CM

## 2023-11-14 ENCOUNTER — Other Ambulatory Visit: Payer: Self-pay | Admitting: Family Medicine

## 2023-11-14 DIAGNOSIS — E7800 Pure hypercholesterolemia, unspecified: Secondary | ICD-10-CM

## 2023-11-14 DIAGNOSIS — M5386 Other specified dorsopathies, lumbar region: Secondary | ICD-10-CM

## 2023-11-14 DIAGNOSIS — F39 Unspecified mood [affective] disorder: Secondary | ICD-10-CM

## 2023-11-16 ENCOUNTER — Telehealth: Payer: Self-pay | Admitting: Family Medicine

## 2023-11-16 MED ORDER — TRAMADOL HCL 50 MG PO TABS: 50.0000 mg | ORAL_TABLET | Freq: Two times a day (BID) | ORAL | 0 refills | Status: DC | PRN

## 2023-11-16 MED ORDER — HYDROXYZINE HCL 25 MG PO TABS: 25.0000 mg | ORAL_TABLET | Freq: Three times a day (TID) | ORAL | 0 refills | Status: DC | PRN

## 2023-11-16 NOTE — Telephone Encounter (Signed)
 She is in [place] with her grand daughter recovering from the hysterectomy.  Went through a depressed time but now feeling better  Needs refills  I let her know to come in to see her new doctor in the next few months  She was appreciative  [facility]

## 2023-11-16 NOTE — Telephone Encounter (Addendum)
 Left VM for her to call office and let me know when to call her.  If she does call you can make her a virtual appointment with me Thanks     Spoke with her on 1/6 She is staying with relatives in [place] recovering from recent surgery.  Overall doing well Continuing her current medications   She plans to follow up with her new PCP when she returns in a few weeks  We wished each other well  I refilled several of her medications

## 2023-11-16 NOTE — Assessment & Plan Note (Signed)
 She missed her last appointment and is now in [place]  with her grand daughter recovering from the hysterectomy.  Went through a depressed time but now feeling better.  I will refill her prescriptions for sertraline  and hydroxyzine .  Recommend she follow up with her new PCP in next 1-2 months.  She agrees

## 2023-11-17 ENCOUNTER — Encounter: Payer: Self-pay | Admitting: Obstetrics and Gynecology

## 2023-11-17 ENCOUNTER — Encounter: Payer: Medicare HMO | Admitting: Obstetrics and Gynecology

## 2023-12-23 ENCOUNTER — Encounter: Payer: Medicare HMO | Admitting: Obstetrics and Gynecology

## 2023-12-24 ENCOUNTER — Telehealth: Payer: Self-pay | Admitting: *Deleted

## 2023-12-24 NOTE — Telephone Encounter (Signed)
Patient no show x2 for 8 week post-op appt.   Call placed to patient, left message requesting return call to Noreene Larsson, RN Supervisor at [phone], option 5 to further discuss.

## 2023-12-28 ENCOUNTER — Other Ambulatory Visit: Payer: Self-pay

## 2023-12-28 DIAGNOSIS — M5386 Other specified dorsopathies, lumbar region: Secondary | ICD-10-CM

## 2023-12-28 MED ORDER — SERTRALINE HCL 100 MG PO TABS: 100.0000 mg | ORAL_TABLET | Freq: Every day | ORAL | 0 refills | Status: DC

## 2023-12-28 MED ORDER — HYDROXYZINE HCL 25 MG PO TABS: 25.0000 mg | ORAL_TABLET | Freq: Three times a day (TID) | ORAL | 0 refills | Status: DC | PRN

## 2023-12-28 MED ORDER — TRAMADOL HCL 50 MG PO TABS: 50.0000 mg | ORAL_TABLET | Freq: Two times a day (BID) | ORAL | 0 refills | Status: DC | PRN

## 2024-01-05 ENCOUNTER — Encounter: Payer: Self-pay | Admitting: Obstetrics and Gynecology

## 2024-01-14 NOTE — Telephone Encounter (Signed)
 Routing to Dr. Karma Greaser to review and advise.

## 2024-01-14 NOTE — Telephone Encounter (Signed)
 Two messages have been left and a letter mailed asking patient to call the office and reschedule her missed appointment; patient has not called back.

## 2024-01-14 NOTE — Telephone Encounter (Signed)
 Encounter closed

## 2024-01-26 ENCOUNTER — Other Ambulatory Visit: Payer: Self-pay | Admitting: Family Medicine

## 2024-02-14 ENCOUNTER — Other Ambulatory Visit: Payer: Self-pay | Admitting: Family Medicine

## 2024-02-14 DIAGNOSIS — E7800 Pure hypercholesterolemia, unspecified: Secondary | ICD-10-CM

## 2024-02-16 ENCOUNTER — Other Ambulatory Visit: Payer: Self-pay

## 2024-02-17 MED ORDER — HYDROXYZINE HCL 25 MG PO TABS: 25.0000 mg | ORAL_TABLET | Freq: Three times a day (TID) | ORAL | 0 refills | Status: DC | PRN

## 2024-03-03 ENCOUNTER — Other Ambulatory Visit: Payer: Self-pay | Admitting: Family Medicine

## 2024-03-03 ENCOUNTER — Encounter: Payer: Self-pay | Admitting: Family Medicine

## 2024-03-03 DIAGNOSIS — Z1231 Encounter for screening mammogram for malignant neoplasm of breast: Secondary | ICD-10-CM

## 2024-03-07 ENCOUNTER — Other Ambulatory Visit: Payer: Self-pay | Admitting: Family Medicine

## 2024-03-07 DIAGNOSIS — G4700 Insomnia, unspecified: Secondary | ICD-10-CM

## 2024-03-15 ENCOUNTER — Ambulatory Visit

## 2024-03-18 ENCOUNTER — Ambulatory Visit: Admitting: Family Medicine

## 2024-03-30 ENCOUNTER — Ambulatory Visit

## 2024-04-05 ENCOUNTER — Other Ambulatory Visit: Payer: Self-pay | Admitting: Family Medicine

## 2024-04-05 DIAGNOSIS — G4700 Insomnia, unspecified: Secondary | ICD-10-CM

## 2024-04-14 ENCOUNTER — Other Ambulatory Visit: Payer: Self-pay | Admitting: Family Medicine

## 2024-04-14 DIAGNOSIS — M5386 Other specified dorsopathies, lumbar region: Secondary | ICD-10-CM

## 2024-04-15 ENCOUNTER — Other Ambulatory Visit: Payer: Self-pay

## 2024-04-15 NOTE — Telephone Encounter (Signed)
 Also recived fax for LISINOPRIL 2.5MG  TAB. That I dont see on current med list. If you want patient to start this medication. Please send Rx with instructions, duration, and dose. thanks

## 2024-04-17 MED ORDER — HYDROXYZINE HCL 25 MG PO TABS: 25.0000 mg | ORAL_TABLET | Freq: Three times a day (TID) | ORAL | 0 refills | Status: DC | PRN

## 2024-05-09 ENCOUNTER — Other Ambulatory Visit: Payer: Self-pay | Admitting: Family Medicine

## 2024-05-12 ENCOUNTER — Ambulatory Visit

## 2024-05-12 VITALS — Ht 64.0 in | Wt 182.0 lb

## 2024-05-12 DIAGNOSIS — Z0000 Encounter for general adult medical examination without abnormal findings: Secondary | ICD-10-CM | POA: Diagnosis not present

## 2024-05-12 NOTE — Patient Instructions (Signed)
 Ms. Craun , Thank you for taking time out of your busy schedule to complete your Annual Wellness Visit with me. I enjoyed our conversation and look forward to speaking with you again next year. I, as well as your care team,  appreciate your ongoing commitment to your health goals. Please review the following plan we discussed and let me know if I can assist you in the future. Your Game plan/ To Do List    Referrals: If you haven't heard from the office you've been referred to, please reach out to them at the phone provided.   Follow up Visits: Next Medicare AWV with our clinical staff: 05/15/2025 at 3:00 p.m. phone visit with [facility]   Have you seen your provider in the last 6 months (3 months if uncontrolled diabetes)? No Next Office Visit with your provider: 05/20/24 at 10:10 a.m. office vist with Dr. Romelle  Clinician Recommendations:  Aim for 30 minutes of exercise or brisk walking, 6-8 glasses of water , and 5 servings of fruits and vegetables each day.       This is a list of the screening recommended for you and due dates:  Health Maintenance  Topic Date Due   Zoster (Shingles) Vaccine (1 of 2) 01/05/2002   Hepatitis B Vaccine (1 of 3 - Risk 3-dose series) Never done   DTaP/Tdap/Td vaccine (2 - Tdap) 11/07/2018   COVID-19 Vaccine (5 - 2024-25 season) 07/12/2023   Mammogram  07/30/2023   Colon Cancer Screening  08/15/2024   Flu Shot  06/10/2024   Medicare Annual Wellness Visit  05/12/2025   Pneumococcal Vaccine for age over 83  Completed   DEXA scan (bone density measurement)  Completed   Hepatitis C Screening  Completed   HPV Vaccine  Aged Out   Meningitis B Vaccine  Aged Out    Advanced directives: (Copy Requested) Please bring a copy of your health care power of attorney and living will to the office to be added to your chart at your convenience. You can mail to [facility] [address] [place], [place] [address] or email to [email or web address] Advance  Care Planning is important because it:  [x]  Makes sure you receive the medical care that is consistent with your values, goals, and preferences  [x]  It provides guidance to your family and loved ones and reduces their decisional burden about whether or not they are making the right decisions based on your wishes.  Follow the link provided in your after visit summary or read over the paperwork we have mailed to you to help you started getting your Advance Directives in place. If you need assistance in completing these, please reach out to us  so that we can help you!  See attachments for Preventive Care and Fall Prevention Tips.

## 2024-05-12 NOTE — Progress Notes (Signed)
 Because this visit was a virtual/telehealth visit,  certain criteria was not obtained, such a blood pressure, CBG if applicable, and timed get up and go. Any medications not marked as taking were not mentioned during the medication reconciliation part of the visit. Any vitals not documented were not able to be obtained due to this being a telehealth visit or patient was unable to self-report a recent blood pressure reading due to a lack of equipment at home via telehealth. Vitals that have been documented are verbally provided by the patient.   Subjective:   Latoya Swanson is a 73 y.o. who presents for a Medicare Wellness preventive visit.  As a reminder, Annual Wellness Visits don't include a physical exam, and some assessments may be limited, especially if this visit is performed virtually. We may recommend an in-person follow-up visit with your provider if needed.  Visit Complete: Virtual I connected with  Latoya Swanson on 05/12/24 by a audio enabled telemedicine application and verified that I am speaking with the correct person using two identifiers.  Patient Location: Other:  bank  Provider Location: Office/Clinic  I discussed the limitations of evaluation and management by telemedicine. The patient expressed understanding and agreed to proceed.  Vital Signs: Because this visit was a virtual/telehealth visit, some criteria may be missing or patient reported. Any vitals not documented were not able to be obtained and vitals that have been documented are patient reported.  VideoDeclined- This patient declined Librarian, academic. Therefore the visit was completed with audio only.  Persons Participating in Visit: Patient.  AWV Questionnaire: No: Patient Medicare AWV questionnaire was not completed prior to this visit.  Cardiac Risk Factors include: advanced age (>59men, >14 women);hypertension;obesity (BMI >30kg/m2);family history of premature cardiovascular  disease     Objective:    Today's Vitals   05/12/24 1621  Weight: 182 lb (82.6 kg)  Height: 5' 4 (1.626 m)  PainSc: 0-No pain   Body mass index is 31.24 kg/m.     05/12/2024    4:23 PM 09/08/2023    7:49 AM 07/14/2023    9:39 AM 05/04/2023    7:14 PM 12/30/2022   10:21 AM 12/17/2022    9:40 AM 11/19/2022    8:57 AM  Advanced Directives  Does Patient Have a Medical Advance Directive? Yes Yes No No No No No  Type of Estate agent of [place];Living will Living will       Does patient want to make changes to medical advance directive?  No - Patient declined       Copy of Healthcare Power of Attorney in Chart? No - copy requested        Would patient like information on creating a medical advance directive?   No - Patient declined Yes (MAU/Ambulatory/Procedural Areas - Information given) No - Patient declined No - Patient declined No - Patient declined    Current Medications (verified) Outpatient Encounter Medications as of 05/12/2024  Medication Sig   amLODipine  (NORVASC ) 10 MG tablet TAKE 1 TABLET BY MOUTH EVERY DAY   atorvastatin  (LIPITOR) 40 MG tablet TAKE 1 TABLET BY MOUTH EVERY DAY   Cholecalciferol (VITAMIN D -3) 1000 UNITS CAPS Take 1 capsule (1,000 Units total) by mouth daily.   diclofenac  Sodium (VOLTAREN  ARTHRITIS PAIN) 1 % GEL Apply to back up to four times a day   hydrOXYzine  (ATARAX ) 25 MG tablet TAKE 1 TABLET BY MOUTH EVERY 8 HOURS AS NEEDED.   QUEtiapine  (SEROQUEL ) 50 MG  tablet TAKE TWO TABS BEFORE BED   sertraline  (ZOLOFT ) 100 MG tablet TAKE 1 TABLET BY MOUTH EVERY DAY   traMADol  (ULTRAM ) 50 MG tablet TAKE 1 TABLET BY MOUTH EVERY 12 HOURS AS NEEDED.   triamterene -hydrochlorothiazide  (MAXZIDE -25) 37.5-25 MG tablet TAKE 1 TABLET BY MOUTH EVERY DAY   vitamin B-12 (CYANOCOBALAMIN) 500 MCG tablet Take 500 mcg by mouth daily.   zolpidem  (AMBIEN  CR) 6.25 MG CR tablet Take 1 tablet (6.25 mg total) by mouth at bedtime as needed for sleep.   Zoster Vaccine  Adjuvanted [facility]) injection Inject 0.5 ml IM and Repeat in 2 months (Patient not taking: Reported on 08/18/2023)   No facility-administered encounter medications on file as of 05/12/2024.    Allergies (verified) Aspirin    History: Past Medical History:  Diagnosis Date   Alcoholism (HCC)    Hx of alcohol abuse. Last abuse was 05/2022 when patient had to be hospitalized. She had recently lost her husband, Paties states that she now only drinks an occasional glass of wine.   Ambulates with cane    only when having an arthritis or sciatica flare   Anemia    no blood transfusions iron injections per pt   Anxiety    Arthritis    spine, shoulders and right hip   Cirrhosis (HCC)    liver, Follows w/ PCP, Dr. Layman Bee. No esophageal varices per 08/15/21 EGD in Epic.   Colon polyps    Depression    GERD (gastroesophageal reflux disease)    occasional   Hepatitis C    treated and cured   History of bleeding peptic ulcer    History of MRI of spine    Right foraminal stenosis at C2-3 due to spurring.   History of rectal fissure    repaired   Hypertension    Follows w/ PCP, Dr. Layman Bee.   Insomnia    takes Seroquel  prn   Neuromuscular disorder (HCC)    sciatica in right leg   Normal cardiac stress test 11/10/2006   [name]   Normal echocardiogram 08/19/2007   Wears dentures    Wears glasses    Past Surgical History:  Procedure Laterality Date   ANAL FISSURE REPAIR     CARPAL TUNNEL RELEASE Bilateral    COLONOSCOPY WITH ESOPHAGOGASTRODUODENOSCOPY (EGD)  08/15/2021   CYSTOSCOPY N/A 09/08/2023   Procedure: CYSTOSCOPY;  Surgeon: Glennon Almarie POUR, MD;  Location: [facility];  Service: Gynecology;  Laterality: N/A;   laparotomy salpingectomy     ectopic pregnancy   ROBOTIC ASSISTED TOTAL HYSTERECTOMY WITH BILATERAL SALPINGO OOPHERECTOMY Bilateral 09/08/2023   Procedure: XI ROBOTIC ASSISTED TOTAL HYSTERECTOMY WITH BILATERAL SALPINGO  OOPHORECTOMY;  Surgeon: Glennon Almarie POUR, MD;  Location: [facility];  Service: Gynecology;  Laterality: Bilateral;   ROTATOR CUFF REPAIR Right    x 2   TUBAL LIGATION     Family History  Problem Relation Age of Onset   Breast cancer Mother        died age 2   Colon cancer Mother    Arthritis Mother    Alzheimer's disease Mother    Coronary artery disease Father        died age 54   Stomach cancer Father    Arthritis Father    Stroke Sister    Heart failure Sister    Hypertension Sister    Arthritis Maternal Grandmother    Arthritis Paternal Grandmother    Breast cancer Paternal Grandmother    Alzheimer's  disease Maternal Aunt        x5   Breast cancer Other        PGA   Social History   Socioeconomic History   Marital status: Widowed    Spouse name: Keven Pinal    Number of children: 2   Years of education: 15   Highest education level: Not on file  Occupational History   Occupation: DISABLED    Employer: UNEMPLOYED  Tobacco Use   Smoking status: Former    Types: Cigarettes   Smokeless tobacco: Never   Tobacco comments:    Smokes 3 - 5 cigarettes per day. Started smoking in her late 13's, then quit for many years. Started smoking again in her 8's.  Vaping Use   Vaping status: Never Used  Substance and Sexual Activity   Alcohol use: Yes    Comment: glass of wine occasionally, hx of alcoholism   Drug use: Not Currently    Types: Marijuana    Comment: Hasn't had maijuana in years per pt on 08/28/23   Sexual activity: Not Currently    Partners: Male    Birth control/protection: Post-menopausal, Surgical    Comment: btl, hysterectomy  Other Topics Concern   Not on file  Social History Narrative   Not on file   Social Drivers of Health   Financial Resource Strain: Low Risk  (05/12/2024)   Overall Financial Resource Strain (CARDIA)    Difficulty of Paying Living Expenses: Not hard at all  Food Insecurity: No Food Insecurity (05/12/2024)    Hunger Vital Sign    Worried About Running Out of Food in the Last Year: Never true    Ran Out of Food in the Last Year: Never true  Transportation Needs: No Transportation Needs (05/12/2024)   PRAPARE - Administrator, Civil Service (Medical): No    Lack of Transportation (Non-Medical): No  Physical Activity: Sufficiently Active (05/12/2024)   Exercise Vital Sign    Days of Exercise per Week: 5 days    Minutes of Exercise per Session: 30 min  Stress: No Stress Concern Present (05/12/2024)   [employer] of Occupational Health - Occupational Stress Questionnaire    Feeling of Stress: Not at all  Social Connections: Moderately Integrated (05/12/2024)   Social Connection and Isolation Panel    Frequency of Communication with Friends and Family: More than three times a week    Frequency of Social Gatherings with Friends and Family: Three times a week    Attends Religious Services: More than 4 times per year    Active Member of Clubs or Organizations: No    Attends Banker Meetings: Never    Marital Status: Married    Tobacco Counseling Counseling given: Not Answered Tobacco comments: Smokes 3 - 5 cigarettes per day. Started smoking in her late 19's, then quit for many years. Started smoking again in her 46's.    Clinical Intake:  Pre-visit preparation completed: Yes  Pain : No/denies pain Pain Score: 0-No pain     BMI - recorded: 31.24 Nutritional Status: BMI > 30  Obese Nutritional Risks: None Diabetes: No  Lab Results  Component Value Date   HGBA1C 4.9 04/15/2016     How often do you need to have someone help you when you read instructions, pamphlets, or other written materials from your doctor or pharmacy?: 1 - Never  Interpreter Needed?: No  Information entered by :: Greta Yung N. Elija Mccamish, LPN.   Activities of Daily Living  05/12/2024    4:23 PM 09/08/2023    7:58 AM  In your present state of health, do you have any difficulty  performing the following activities:  Hearing? 0 0  Vision? 0 0  Difficulty concentrating or making decisions? 0 0  Walking or climbing stairs? 0   Dressing or bathing? 0   Doing errands, shopping? 0   Preparing Food and eating ? N   Using the Toilet? N   In the past six months, have you accidently leaked urine? N   Do you have problems with loss of bowel control? N   Managing your Medications? N   Managing your Finances? N   Housekeeping or managing your Housekeeping? N     Patient Care Team: Romelle Booty, MD as PCP - General (Family Medicine) Claudene Muskrat, OD (Optometry) Merrily Nearing, Jill Evelyn, MD as Attending Physician (Obstetrics and Gynecology) [place], My [place], [place] as Referring Physician (Optometry)  I have updated your Care Teams any recent Medical Services you may have received from other providers in the past year.     Assessment:   This is a routine wellness examination for [facility].  Hearing/Vision screen Hearing Screening - Comments:: Denies hearing difficulties.  Vision Screening - Comments:: Wears rx glasses - up to date with routine eye exams with My China Le, OD.    Goals Addressed             This Visit's Progress    05/12/2024: Continue to maintain and stay independent.         Depression Screen     05/12/2024    4:24 PM 07/14/2023    9:39 AM 05/04/2023    7:11 PM 04/14/2023   11:19 AM 12/30/2022   10:22 AM 12/17/2022    9:40 AM 11/19/2022    8:57 AM  PHQ 2/9 Scores  PHQ - 2 Score 0 2 3 3 6     PHQ- 9 Score 0 6 11 11 17     Exception Documentation      Patient refusal Patient refusal    Fall Risk     05/12/2024    4:23 PM 09/23/2023   10:31 AM 08/18/2023   10:02 AM 07/14/2023    9:39 AM 05/04/2023    7:13 PM  Fall Risk   Falls in the past year? 0 0 1 0 0  Number falls in past yr: 0 0 0 0 0  Injury with Fall? 0 0 0 0 0  Risk for fall due to : No Fall Risks No Fall Risks No Fall Risks  No Fall Risks  Follow up Falls evaluation completed Falls  evaluation completed Falls evaluation completed  Falls prevention discussed;Education provided;Falls evaluation completed    MEDICARE RISK AT HOME:  Medicare Risk at Home Any stairs in or around the home?: No If so, are there any without handrails?: No Home free of loose throw rugs in walkways, pet beds, electrical cords, etc?: Yes Adequate lighting in your home to reduce risk of falls?: Yes Life alert?: No Use of a cane, walker or w/c?: No Grab bars in the bathroom?: Yes Shower chair or bench in shower?: No Elevated toilet seat or a handicapped toilet?: Yes  TIMED UP AND GO:  Was the test performed?  No  Cognitive Function: 6CIT completed    05/12/2024    4:26 PM  MMSE - Mini Mental State Exam  Not completed: Unable to complete        05/12/2024    4:22  PM 05/04/2023    7:13 PM  6CIT Screen  What Year? 0 points 0 points  What month? 0 points 0 points  What time? 0 points 0 points  Count back from 20 0 points 0 points  Months in reverse 0 points 0 points  Repeat phrase 0 points 0 points  Total Score 0 points 0 points    Immunizations Immunization History  Administered Date(s) Administered   Fluad Quad(high Dose 65+) 09/02/2022   H1N1 11/07/2008   Influenza Split 08/18/2012   Influenza Whole 09/10/2009, 08/28/2010   Influenza, High Dose Seasonal PF 09/28/2018   Influenza,inj,Quad PF,6+ Mos 07/13/2014, 07/18/2015, 09/01/2016, 07/31/2017, 08/17/2019   PFIZER Comirnaty(Gray Top)Covid-19 Tri-Sucrose Vaccine 12/05/2020   PFIZER(Purple Top)SARS-COV-2 Vaccination 01/06/2020, 01/31/2020   Pfizer(Comirnaty)Fall Seasonal Vaccine 12 years and older 10/07/2022   Pneumococcal Conjugate-13 07/31/2017   Pneumococcal Polysaccharide-23 11/07/2008, 09/28/2018   Td 11/07/2008   Zoster, Live 11/10/2014    Screening Tests Health Maintenance  Topic Date Due   Zoster Vaccines- Shingrix (1 of 2) 01/05/2002   Hepatitis B Vaccines (1 of 3 - Risk 3-dose series) Never done    DTaP/Tdap/Td (2 - Tdap) 11/07/2018   COVID-19 Vaccine (5 - 2024-25 season) 07/12/2023   MAMMOGRAM  07/30/2023   Colonoscopy  08/15/2024   INFLUENZA VACCINE  06/10/2024   Medicare Annual Wellness (AWV)  05/12/2025   Pneumococcal Vaccine: 50+ Years  Completed   DEXA SCAN  Completed   Hepatitis C Screening  Completed   HPV VACCINES  Aged Out   Meningococcal B Vaccine  Aged Out    Health Maintenance  Health Maintenance Due  Topic Date Due   Zoster Vaccines- Shingrix (1 of 2) 01/05/2002   Hepatitis B Vaccines (1 of 3 - Risk 3-dose series) Never done   DTaP/Tdap/Td (2 - Tdap) 11/07/2018   COVID-19 Vaccine (5 - 2024-25 season) 07/12/2023   MAMMOGRAM  07/30/2023   Colonoscopy  08/15/2024   Health Maintenance Items Addressed: Yes Patient due for the following: Colonoscopy, Mammogram and various vaccines.  Additional Screening:  Vision Screening: Recommended annual ophthalmology exams for early detection of glaucoma and other disorders of the eye. Would you like a referral to an eye doctor? No    Dental Screening: Recommended annual dental exams for proper oral hygiene  Community Resource Referral / Chronic Care Management: CRR required this visit?  No   CCM required this visit?  No   Plan:    I have personally reviewed and noted the following in the patient's chart:   Medical and social history Use of alcohol, tobacco or illicit drugs  Current medications and supplements including opioid prescriptions. Patient is not currently taking opioid prescriptions. Functional ability and status Nutritional status Physical activity Advanced directives List of other physicians Hospitalizations, surgeries, and ER visits in previous 12 months Vitals Screenings to include cognitive, depression, and falls Referrals and appointments  In addition, I have reviewed and discussed with patient certain preventive protocols, quality metrics, and best practice recommendations. A written  personalized care plan for preventive services as well as general preventive health recommendations were provided to patient.   Roz LOISE Fuller, LPN   12/16/7972   After Visit Summary: (Declined) Due to this being a telephonic visit, with patients personalized plan was offered to patient but patient Declined AVS at this time   Notes: Patient due for the following: Colonoscopy, Mammogram and various vaccines.

## 2024-05-14 ENCOUNTER — Other Ambulatory Visit: Payer: Self-pay | Admitting: Family Medicine

## 2024-05-14 DIAGNOSIS — M5386 Other specified dorsopathies, lumbar region: Secondary | ICD-10-CM

## 2024-05-20 ENCOUNTER — Ambulatory Visit: Admitting: Family Medicine

## 2024-05-20 NOTE — Progress Notes (Deleted)
    SUBJECTIVE:   CHIEF COMPLAINT / HPI:   ***  PERTINENT  PMH / PSH: ***  OBJECTIVE:   LMP 12/01/2001   ***  ASSESSMENT/PLAN:   Assessment & Plan    Payton Coward, MD [facility]

## 2024-06-06 ENCOUNTER — Other Ambulatory Visit: Payer: Self-pay | Admitting: Family Medicine

## 2024-06-06 DIAGNOSIS — M5386 Other specified dorsopathies, lumbar region: Secondary | ICD-10-CM

## 2024-07-01 ENCOUNTER — Other Ambulatory Visit: Payer: Self-pay | Admitting: Family Medicine

## 2024-07-01 DIAGNOSIS — G4700 Insomnia, unspecified: Secondary | ICD-10-CM

## 2024-07-14 ENCOUNTER — Encounter: Payer: Self-pay | Admitting: Family Medicine

## 2024-07-14 ENCOUNTER — Ambulatory Visit (INDEPENDENT_AMBULATORY_CARE_PROVIDER_SITE_OTHER): Admitting: Family Medicine

## 2024-07-14 VITALS — BP 159/88 | HR 70 | Temp 97.7°F | Ht 64.0 in | Wt 185.0 lb

## 2024-07-14 DIAGNOSIS — I10 Essential (primary) hypertension: Secondary | ICD-10-CM | POA: Diagnosis not present

## 2024-07-14 DIAGNOSIS — F39 Unspecified mood [affective] disorder: Secondary | ICD-10-CM | POA: Diagnosis not present

## 2024-07-14 DIAGNOSIS — E7800 Pure hypercholesterolemia, unspecified: Secondary | ICD-10-CM

## 2024-07-14 DIAGNOSIS — Z23 Encounter for immunization: Secondary | ICD-10-CM

## 2024-07-14 DIAGNOSIS — M5386 Other specified dorsopathies, lumbar region: Secondary | ICD-10-CM

## 2024-07-14 NOTE — Assessment & Plan Note (Signed)
 Depressive and anxious symptoms in the setting of grief and loss of loved ones No SI or HI Continue Zoloft .  Advised that she take this daily and not skip doses.  I daily taking this regularly may also help prevent her from needing Atarax  Discussed precautions regarding Atarax  Gets benefit from Seroquel  for sleep as well

## 2024-07-14 NOTE — Assessment & Plan Note (Signed)
 Reviewed MRI from 2018 which showed mild spinal stenosis at L3-L4, facet arthropathy and lateral recess narrowing at L4-L5, facet arthropathy, recess narrowing, and foraminal stenosis at L5-S1 On tramadol , PDMP previously reviewed Okay to continue tramadol  for now as this greatly assists her with completing ADLs and completing tasks such as mowing the lawn, doing laundry, painting her house However upon further discussion she is agreeable to seeing a spine specialist to explore other therapies or injections.  Placed referral to Ortho.  She has previously tried physical therapy. I daily if her pain can be controlled with something such as injections or procedure then she is hoping to come off of tramadol 

## 2024-07-14 NOTE — Assessment & Plan Note (Signed)
 Elevated in office today but reports home readings with diastolic running in the 60s Advised home BP monitoring and follow-up in 4 weeks.  Prefer not to increase regimen if truly having low diastolics at home Continue current regimen for now, amlodipine  10 mg daily and triamterene -hydrochlorothiazide  37.5-25 mg daily Continue Lipitor 40 mg daily BMP and lipid panel today

## 2024-07-14 NOTE — Progress Notes (Signed)
 SUBJECTIVE:   CHIEF COMPLAINT / HPI:   Medication management Reviewed and updated medications On tramadol , experiences great benefit from this for chronic pain Storage in a safe place Denies falls or syncope Denies illicit drug use or alcohol use  HTN Home BP running in 130s/60s Feels she was rushing a lot today which might make it higher On amlodipine  and triamterene -hydrochlorothiazide    Has experienced a lot of deaths of loved ones over past several years. Husband, grandchild, friends.  Feels like she has good social support with friends.  Sister lives in [place]  and she just visited her.  Sisters husband having some dementia.  Her husband Cleotilde was a great presence in her life and since he passed she has been feeling more and more lonely.  She feels like her symptoms are well-controlled on Zoloft  and as needed Atarax .  She does not feel like the Atarax  makes her sleepy.  She does report that occasionally she skips doses of Zoloft .  She is currently in the process of painting her kitchen cabinets.  The color scheme is yellow and white. Her husband Cleotilde taught her how to paint and so doing this provides her feeling of connection with him.  Otherwise she is active at home thanks to the tramadol .  She mows her own lawn, trims her own hedges, and does her own cleaning.  PERTINENT  PMH / PSH: HTN, cirrhosis, sciatica, mood disorder, HLD  OBJECTIVE:   BP (!) 159/88   Pulse 70   Temp 97.7 F (36.5 C)   Ht 5' 4 (1.626 m)   Wt 185 lb (83.9 kg)   LMP 12/01/2001   SpO2 94%   BMI 31.76 kg/m    General: NAD, pleasant, able to participate in exam Cardiac: RRR, no murmurs auscultated Respiratory: CTAB, normal WOB Abdomen: soft, non-tender, non-distended, normoactive bowel sounds Extremities: warm and well perfused, no edema or cyanosis Skin: warm and dry, no rashes noted Neuro: alert, no obvious focal deficits, speech normal Psych: Normal affect and mood, becomes  tearful when discussing grief       07/14/2024    2:05 PM 05/12/2024    4:24 PM 07/14/2023    9:39 AM 05/04/2023    7:11 PM 04/14/2023   11:19 AM  Depression screen PHQ 2/9  Decreased Interest 1 0 1 1 1   Down, Depressed, Hopeless 1 0 1 2 2   PHQ - 2 Score 2 0 2 3 3   Altered sleeping 2 0 1 2 2   Tired, decreased energy 2 0 2 2 2   Change in appetite 0 0 1 2 2   Feeling bad or failure about yourself  0 0 0 0 0  Trouble concentrating 0 0 0 1 1  Moving slowly or fidgety/restless 0 0 0 1 1  Suicidal thoughts 0 0 0 0 0  PHQ-9 Score 6 0 6 11 11   Difficult doing work/chores Not difficult at all Not difficult at all Not difficult at all       ASSESSMENT/PLAN:   Assessment & Plan Essential hypertension Pure hypercholesterolemia Elevated in office today but reports home readings with diastolic running in the 60s Advised home BP monitoring and follow-up in 4 weeks.  Prefer not to increase regimen if truly having low diastolics at home Continue current regimen for now, amlodipine  10 mg daily and triamterene -hydrochlorothiazide  37.5-25 mg daily Continue Lipitor 40 mg daily BMP and lipid panel today Sciatica associated with disorder of lumbar spine Reviewed MRI from 2018 which showed mild spinal  stenosis at L3-L4, facet arthropathy and lateral recess narrowing at L4-L5, facet arthropathy, recess narrowing, and foraminal stenosis at L5-S1 On tramadol , PDMP previously reviewed Okay to continue tramadol  for now as this greatly assists her with completing ADLs and completing tasks such as mowing the lawn, doing laundry, painting her house However upon further discussion she is agreeable to seeing a spine specialist to explore other therapies or injections.  Placed referral to Ortho.  She has previously tried physical therapy. I daily if her pain can be controlled with something such as injections or procedure then she is hoping to come off of tramadol  Mood disorder (HCC) Depressive and anxious symptoms  in the setting of grief and loss of loved ones No SI or HI Continue Zoloft .  Advised that she take this daily and not skip doses.  I daily taking this regularly may also help prevent her from needing Atarax  Discussed precautions regarding Atarax  Gets benefit from Seroquel  for sleep as well   Payton Coward, MD [facility]

## 2024-07-14 NOTE — Patient Instructions (Addendum)
 Continue your current medications and keep track of BP at home You should hear back soon to schedule with the spine doctor Please schedule your mammogram If any of your results from today are abnormal and/or require changes to your medical care, I will give you a call. Otherwise, I will send you a letter in the mail or a message on MyChart.   Blood Pressure Record Sheet To take your blood pressure, you will need a blood pressure machine. You can buy a blood pressure machine (blood pressure monitor) at your clinic, drug store, or online. When choosing one, consider: An automatic monitor that has an arm cuff. A cuff that wraps snugly around your upper arm. You should be able to fit only one finger between your arm and the cuff. A device that stores blood pressure reading results. Do not choose a monitor that measures your blood pressure from your wrist or finger. Follow your health care provider's instructions for how to take your blood pressure. To use this form: Take your blood pressure medications every day These measurements should be taken when you have been at rest for at least 10-15 min Take at least 2 readings with each blood pressure check. This makes sure the results are correct. Wait 1-2 minutes between measurements. Write down the results in the spaces on this form. Keep in mind it should always be recorded systolic over diastolic. Both numbers are important.  Repeat this every day for 2-3 weeks, or as told by your health care provider.  Make a follow-up appointment with your health care provider to discuss the results.  Blood Pressure Log Date Medications taken? (Y/N) Blood Pressure Time of Day

## 2024-07-15 LAB — BASIC METABOLIC PANEL WITH GFR
BUN/Creatinine Ratio: 14 (ref 12–28)
BUN: 11 mg/dL (ref 8–27)
CO2: 23 mmol/L (ref 20–29)
Calcium: 10.3 mg/dL (ref 8.7–10.3)
Chloride: 103 mmol/L (ref 96–106)
Creatinine, Ser: 0.76 mg/dL (ref 0.57–1.00)
Glucose: 90 mg/dL (ref 70–99)
Potassium: 3.7 mmol/L (ref 3.5–5.2)
Sodium: 143 mmol/L (ref 134–144)
eGFR: 83 mL/min/1.73 (ref >59)

## 2024-07-15 LAB — LIPID PANEL
Chol/HDL Ratio: 3.6 ratio (ref 0.0–4.4)
Cholesterol, Total: 261 mg/dL — ABNORMAL HIGH (ref 100–199)
HDL: 73 mg/dL (ref >39)
LDL Chol Calc (NIH): 164 mg/dL — ABNORMAL HIGH (ref 0–99)
Triglycerides: 135 mg/dL (ref 0–149)
VLDL Cholesterol Cal: 24 mg/dL (ref 5–40)

## 2024-07-17 ENCOUNTER — Ambulatory Visit: Payer: Self-pay | Admitting: Family Medicine

## 2024-07-18 ENCOUNTER — Other Ambulatory Visit: Payer: Self-pay

## 2024-07-18 ENCOUNTER — Other Ambulatory Visit: Payer: Self-pay | Admitting: Family Medicine

## 2024-07-18 DIAGNOSIS — M5386 Other specified dorsopathies, lumbar region: Secondary | ICD-10-CM

## 2024-07-18 MED ORDER — TRAMADOL HCL 50 MG PO TABS: 50.0000 mg | ORAL_TABLET | Freq: Two times a day (BID) | ORAL | 0 refills | Status: DC | PRN

## 2024-07-26 ENCOUNTER — Encounter: Payer: Self-pay | Admitting: Gastroenterology

## 2024-08-16 ENCOUNTER — Encounter: Payer: Self-pay | Admitting: Physical Medicine and Rehabilitation

## 2024-08-16 ENCOUNTER — Ambulatory Visit: Admitting: Physical Medicine and Rehabilitation

## 2024-08-16 DIAGNOSIS — M542 Cervicalgia: Secondary | ICD-10-CM

## 2024-08-16 DIAGNOSIS — M546 Pain in thoracic spine: Secondary | ICD-10-CM

## 2024-08-16 DIAGNOSIS — M5441 Lumbago with sciatica, right side: Secondary | ICD-10-CM

## 2024-08-16 DIAGNOSIS — G8929 Other chronic pain: Secondary | ICD-10-CM

## 2024-08-16 DIAGNOSIS — M7918 Myalgia, other site: Secondary | ICD-10-CM | POA: Diagnosis not present

## 2024-08-16 NOTE — Progress Notes (Signed)
 Pain Scale   Average Pain 7 Patient advising she has chronic lower back pain radiating to both legs( patient also advising she has neck pain)        +Driver, -BT, -Dye Allergies.

## 2024-08-16 NOTE — Progress Notes (Signed)
 Latoya Swanson - 73 y.o. female MRN [id number]  Date of birth: 21-Oct-1951  Office Visit Note: Visit Date: 08/16/2024 PCP: Romelle Booty, MD Referred by: Romelle Booty, MD  Subjective: Chief Complaint  Patient presents with   Lower Back - Pain   HPI: Latoya Swanson is a 73 y.o. female who comes in today per the request of Dr. Donald Lai for evaluation of pain to entire back. States her pain radiates from neck all the way down to bilateral lower back, buttocks and right lateral leg. Most severe pain at this time is bilateral lower back pain radiating to buttocks and down right leg. Pain ongoing for several years. No specific aggravating factors that cause pain to worsening. States she is unable to mow her lawn due to excruciating pain. She describes pain as sharp and sore sensation, currently rates as 8 out of 10. Some relief of pain with home exercise regimen, rest and use of medications. She takes Tramadol  that is prescribed by her primary care provider.History of formal physical therapy with minimal relief of pain. Lumbar MRI imaging from 2018 shows moderate bilateral facet arthropathy at L5-S1. There is also bilateral lateral recess narrowing and moderate bilateral foraminal stenosis at this level. No history of lumbar surgery/injections. Patient denies focal weakness, numbness and tingling. No recent trauma or falls.   She reports loss of her husband 2 years ago, children live in another state.      Review of Systems  Musculoskeletal:  Positive for back pain, myalgias and neck pain.  Neurological:  Negative for tingling, sensory change, focal weakness and weakness.  All other systems reviewed and are negative.  Otherwise per HPI.  Assessment & Plan: Visit Diagnoses:    ICD-10-CM   1. Neck pain  M54.2 MR LUMBAR SPINE WO CONTRAST    2. Chronic bilateral thoracic back pain  M54.6 MR LUMBAR SPINE WO CONTRAST   G89.29     3. Chronic bilateral low back pain with right-sided sciatica   G89.29 MR LUMBAR SPINE WO CONTRAST   M54.41     4. Myofascial pain syndrome  M79.18 MR LUMBAR SPINE WO CONTRAST       Plan: Findings:  Chronic, worsening and severe pain to entire back. Biggest pain generator is bilateral lower back pain radiating to buttocks and down right lateral leg to foot. Patient continues to have severe pain despite good conservative therapies such as formal physical therapy, home exercise regimen, rest and use of medications. Patients clinical presentation and exam are consistent with lumbar radiculopathy, more of L5 nerve pattern. I explained to her that the pain radiating up to thoracic back and neck is likely more myofascial in nature. Would not rule out chronic pain/central sensitization syndrome. She is very tender upon palpation to lumbar, thoracic and cervical paraspinal regions today. We discussed treatment plan in detail today. Next step is to place order for lumbar MRI imaging. Depending on results of MRI imaging we discussed possibility of performing lumbar epidural steroid injection. She can continue with chronic pain management, would consider referral to pain management practice if needed. No red flag symptoms noted upon exam today.     Meds & Orders: No orders of the defined types were placed in this encounter.   Orders Placed This Encounter  Procedures   MR LUMBAR SPINE WO CONTRAST    Follow-up: Return for Lumbar MRI review.   Procedures: No procedures performed      Clinical History: No specialty comments available.  She reports that she has quit smoking. Her smoking use included cigarettes. She has never used smokeless tobacco. No results for input(s): HGBA1C, LABURIC in the last 8760 hours.  Objective:  VS:  HT:    WT:   BMI:     BP:   HR: bpm  TEMP: ( )  RESP:  Physical Exam Vitals and nursing note reviewed.  HENT:     Head: Normocephalic and atraumatic.     Right Ear: External ear normal.     Left Ear: External ear normal.      Nose: Nose normal.     Mouth/Throat:     Mouth: Mucous membranes are moist.  Eyes:     Extraocular Movements: Extraocular movements intact.  Cardiovascular:     Rate and Rhythm: Normal rate.     Pulses: Normal pulses.  Pulmonary:     Effort: Pulmonary effort is normal.  Abdominal:     General: Abdomen is flat. There is no distension.  Musculoskeletal:        General: Tenderness present.     Cervical back: Normal range of motion.     Comments: Patient rises from seated position to standing without difficulty. Good lumbar range of motion. No pain noted with facet loading. 5/5 strength noted with bilateral hip flexion, knee flexion/extension, ankle dorsiflexion/plantarflexion and EHL. No clonus noted bilaterally. No pain upon palpation of greater trochanters. No pain with internal/external rotation of bilateral hips. Sensation intact bilaterally. Dysesthesias noted to right L5 dermatome. Myofascial tenderness noted to cervical, thoracic and lumbar paraspinal regions upon palpation. Negative slump test bilaterally. Ambulates without aid, gait steady.     Skin:    General: Skin is warm and dry.     Capillary Refill: Capillary refill takes less than 2 seconds.  Neurological:     General: No focal deficit present.     Mental Status: She is alert and oriented to person, place, and time.  Psychiatric:        Mood and Affect: Mood normal.        Behavior: Behavior normal.     Ortho Exam  Imaging: No results found.  Past Medical/Family/Surgical/Social History: Medications & Allergies reviewed per EMR, new medications updated. 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 Last abuse was 05/2022 when patient had to be hospitalized. She had recently lost her husband, Paties states that she now only drinks an occasional glass of wine.   Ambulates with cane    only when having an arthritis or sciatica flare   Anemia    no blood transfusions iron injections per pt   Anxiety    Arthritis    spine, shoulders and right hip   Cirrhosis (HCC)    liver, Follows w/ PCP, Dr. Layman Bee. No esophageal varices per 08/15/21 EGD in Epic.   Colon polyps    Depression    GERD (gastroesophageal reflux disease)    occasional  Hepatitis C    treated and cured   History of bleeding peptic ulcer    History of MRI of spine    Right foraminal stenosis at C2-3 due to spurring.   History of rectal fissure    repaired   Hypertension    Follows w/ PCP, Dr. Layman Bee.   Insomnia    takes Seroquel  prn   Neuromuscular disorder (HCC)    sciatica in right leg   Normal cardiac stress test 11/10/2006   [place]   Normal echocardiogram 08/19/2007   Wears dentures    Wears glasses    Family History  Problem Relation Age of Onset   Breast cancer Mother        died age 57   Colon cancer Mother    Arthritis Mother    Alzheimer's disease Mother    Coronary artery disease Father        died age 80   Stomach cancer Father    Arthritis Father    Stroke Sister    Heart failure Sister    Hypertension Sister    Arthritis Maternal Grandmother    Arthritis Paternal Grandmother    Breast cancer Paternal Grandmother    Alzheimer's disease Maternal Aunt        x5   Breast cancer Other        PGA   Past Surgical History:  Procedure Laterality Date   ANAL FISSURE REPAIR     CARPAL TUNNEL RELEASE Bilateral    COLONOSCOPY  WITH ESOPHAGOGASTRODUODENOSCOPY (EGD)  08/15/2021   CYSTOSCOPY N/A 09/08/2023   Procedure: CYSTOSCOPY;  Surgeon: Glennon Almarie POUR, MD;  Location: [facility];  Service: Gynecology;  Laterality: N/A;   laparotomy salpingectomy     ectopic pregnancy   ROBOTIC ASSISTED TOTAL HYSTERECTOMY WITH BILATERAL SALPINGO OOPHERECTOMY Bilateral 09/08/2023   Procedure: XI ROBOTIC ASSISTED TOTAL HYSTERECTOMY WITH BILATERAL SALPINGO OOPHORECTOMY;  Surgeon: Glennon Almarie POUR, MD;  Location: [facility];  Service: Gynecology;  Laterality: Bilateral;   ROTATOR CUFF REPAIR Right    x 2   TUBAL LIGATION     Social History   Occupational History   Occupation: DISABLED    Employer: UNEMPLOYED  Tobacco Use   Smoking status: Former    Types: Cigarettes   Smokeless tobacco: Never   Tobacco comments:    Smokes 3 - 5 cigarettes per day. Started smoking in her late 78's, then quit for many years. Started smoking again in her 69's.  Vaping Use   Vaping status: Never Used  Substance and Sexual Activity   Alcohol use: Yes    Comment: glass of wine occasionally, hx of alcoholism   Drug use: Not Currently    Types: Marijuana    Comment: Hasn't had maijuana in years per pt on 08/28/23   Sexual activity: Not Currently    Partners: Male    Birth control/protection: Post-menopausal, Surgical    Comment: btl, hysterectomy

## 2024-08-18 NOTE — Progress Notes (Signed)
 Latoya Swanson                                          MRN: [id number]   08/18/2024   The [facility] Quality Team Specialist reviewed this patient medical record for the purposes of chart review for care gap closure. The following were reviewed: chart review for care gap closure-controlling blood pressure.    [facility] Quality Team

## 2024-08-19 ENCOUNTER — Encounter: Payer: Self-pay | Admitting: Physical Medicine and Rehabilitation

## 2024-08-23 ENCOUNTER — Encounter: Payer: Self-pay | Admitting: Obstetrics and Gynecology

## 2024-08-23 ENCOUNTER — Ambulatory Visit (INDEPENDENT_AMBULATORY_CARE_PROVIDER_SITE_OTHER): Admitting: Obstetrics and Gynecology

## 2024-08-23 ENCOUNTER — Ambulatory Visit: Payer: Self-pay | Admitting: Obstetrics and Gynecology

## 2024-08-23 VITALS — BP 130/80 | HR 59 | Wt 193.6 lb

## 2024-08-23 DIAGNOSIS — N3000 Acute cystitis without hematuria: Secondary | ICD-10-CM

## 2024-08-23 DIAGNOSIS — N342 Other urethritis: Secondary | ICD-10-CM

## 2024-08-23 DIAGNOSIS — R102 Pelvic and perineal pain unspecified side: Secondary | ICD-10-CM

## 2024-08-23 DIAGNOSIS — M8580 Other specified disorders of bone density and structure, unspecified site: Secondary | ICD-10-CM

## 2024-08-23 DIAGNOSIS — Z1231 Encounter for screening mammogram for malignant neoplasm of breast: Secondary | ICD-10-CM

## 2024-08-23 DIAGNOSIS — E2839 Other primary ovarian failure: Secondary | ICD-10-CM

## 2024-08-23 DIAGNOSIS — Z1211 Encounter for screening for malignant neoplasm of colon: Secondary | ICD-10-CM

## 2024-08-23 DIAGNOSIS — R3911 Hesitancy of micturition: Secondary | ICD-10-CM | POA: Diagnosis not present

## 2024-08-23 MED ORDER — NITROFURANTOIN MONOHYD MACRO 100 MG PO CAPS: 100.0000 mg | ORAL_CAPSULE | Freq: Two times a day (BID) | ORAL | 0 refills | Status: AC

## 2024-08-23 MED ORDER — ESTRADIOL 0.01 % VA CREA
1.0000 | TOPICAL_CREAM | Freq: Every day | VAGINAL | 12 refills | Status: AC
Start: 2024-08-23 — End: ?

## 2024-08-23 MED ORDER — FLUCONAZOLE 150 MG PO TABS: 150.0000 mg | ORAL_TABLET | Freq: Once | ORAL | 0 refills | Status: AC

## 2024-08-23 NOTE — Progress Notes (Signed)
   Acute Office Visit  Subjective:    Patient ID: ONICA DAVIDOVICH, female    DOB: [birth year], 73 y.o.   MRN: [id number]   HPI 73 y.o. presents today for Gynecologic Exam (Pelvic pain and discharge, since April it comes and goes. Discharge at times has a grayish/tan color and a smell. She states that at time she fells like she has to void then can't.  /Mammo-07/29/21/Dexa-11/07/21/Colonoscopy-08/15/21/09-08-23 robotic hysterectomy, BSO, cystoscopy) . She lost her husband of 50 years in 6/23.    Has hesitancy with voiding.  Patient's last menstrual period was 12/01/2001.          Sexually active: No.  The current method of family planning is post menopausal status.    Exercising: Yes.    Walking and yard work  Smoker:  yes 2024 TAH/BSO for fibroids benign pathology Health Maintenance: Pap:  pap10/26/16 WNL HR HPV neg  History of abnormal Pap:  no MMG:  07/29/21 Bi-rads 1 neg referral placed BMD:   11/08/21 osteopenic referral placed Colonoscopy: 08/15/21 F/u 3 years referral placed TDaP:  2015 Gardasil: none Patient's last menstrual period was 12/01/2001.    Review of Systems     Objective:    OBGyn Exam  BP 130/80   Pulse (!) 59   Wt 193 lb 9.6 oz (87.8 kg)   LMP 12/01/2001   SpO2 95%   BMI 33.23 kg/m  Wt Readings from Last 3 Encounters:  08/23/24 193 lb 9.6 oz (87.8 kg)  07/14/24 185 lb (83.9 kg)  05/12/24 182 lb (82.6 kg)       UA with 1+ LE and 6-10 wbc Assessment & Plan:  UTI Macrobid sent to treat UTI. To take diflucan after po x1 to prevent a yeast infection.  Counseled on returning with persistent or worsening s/s. To begin vaginal estrogen as well for bladder support and reduce UTI's Referrals for screening placed Rtc for annual exams or with any concerns Dr. Glennon Almarie MARLA Glennon

## 2024-08-25 ENCOUNTER — Other Ambulatory Visit: Payer: Self-pay | Admitting: Family Medicine

## 2024-08-25 DIAGNOSIS — M5386 Other specified dorsopathies, lumbar region: Secondary | ICD-10-CM

## 2024-08-25 LAB — URINE CULTURE
MICRO NUMBER:: 17096656
Result:: NO GROWTH
SPECIMEN QUALITY:: ADEQUATE

## 2024-08-25 LAB — URINALYSIS, COMPLETE W/RFL CULTURE
Bilirubin Urine: NEGATIVE
Glucose, UA: NEGATIVE
Hgb urine dipstick: NEGATIVE
Hyaline Cast: NONE SEEN /LPF
Ketones, ur: NEGATIVE
Nitrites, Initial: NEGATIVE
RBC / HPF: NONE SEEN /HPF (ref 0–2)
Specific Gravity, Urine: 1.015 (ref 1.001–1.035)
pH: 5.5 (ref 5.0–8.0)

## 2024-08-25 LAB — CULTURE INDICATED

## 2024-08-31 ENCOUNTER — Ambulatory Visit
Admission: RE | Admit: 2024-08-31 | Discharge: 2024-08-31 | Disposition: A | Discharge status: Home / Self Care | Source: Ambulatory Visit | Attending: Physical Medicine and Rehabilitation | Admitting: Physical Medicine and Rehabilitation

## 2024-08-31 DIAGNOSIS — M7918 Myalgia, other site: Secondary | ICD-10-CM

## 2024-08-31 DIAGNOSIS — M4727 Other spondylosis with radiculopathy, lumbosacral region: Secondary | ICD-10-CM | POA: Diagnosis not present

## 2024-08-31 DIAGNOSIS — G8929 Other chronic pain: Secondary | ICD-10-CM

## 2024-08-31 DIAGNOSIS — M542 Cervicalgia: Secondary | ICD-10-CM

## 2024-08-31 DIAGNOSIS — M5116 Intervertebral disc disorders with radiculopathy, lumbar region: Secondary | ICD-10-CM | POA: Diagnosis not present

## 2024-08-31 DIAGNOSIS — M48061 Spinal stenosis, lumbar region without neurogenic claudication: Secondary | ICD-10-CM | POA: Diagnosis not present

## 2024-09-06 ENCOUNTER — Telehealth: Payer: Self-pay

## 2024-09-06 NOTE — Telephone Encounter (Signed)
 LMX1 to schedule OV for MRI review with Megan

## 2024-09-09 ENCOUNTER — Other Ambulatory Visit: Payer: Self-pay | Admitting: Family Medicine

## 2024-09-15 NOTE — Progress Notes (Signed)
Left message for call back to triage

## 2024-09-15 NOTE — Telephone Encounter (Signed)
 Patient called today and states that she is still having the pelvic pain and lower back pain. Pain has not gotten worse but also has not gotten better. Advised patient that urogyn could take 4-6 to be in contact with her regarding an appointment. Routing to provider for FYI.

## 2024-09-21 ENCOUNTER — Ambulatory Visit: Payer: Self-pay | Admitting: Obstetrics and Gynecology

## 2024-09-21 ENCOUNTER — Ambulatory Visit (INDEPENDENT_AMBULATORY_CARE_PROVIDER_SITE_OTHER): Admitting: Obstetrics and Gynecology

## 2024-09-21 ENCOUNTER — Encounter: Payer: Self-pay | Admitting: Obstetrics and Gynecology

## 2024-09-21 VITALS — BP 130/80 | HR 71 | Temp 97.9°F | Ht 64.0 in | Wt 193.0 lb

## 2024-09-21 DIAGNOSIS — R3915 Urgency of urination: Secondary | ICD-10-CM | POA: Diagnosis not present

## 2024-09-21 DIAGNOSIS — N3941 Urge incontinence: Secondary | ICD-10-CM

## 2024-09-21 DIAGNOSIS — N898 Other specified noninflammatory disorders of vagina: Secondary | ICD-10-CM | POA: Diagnosis not present

## 2024-09-21 DIAGNOSIS — R102 Pelvic and perineal pain unspecified side: Secondary | ICD-10-CM | POA: Diagnosis not present

## 2024-09-21 LAB — URINALYSIS, COMPLETE W/RFL CULTURE
Bacteria, UA: NONE SEEN /HPF
Bilirubin Urine: NEGATIVE
Glucose, UA: NEGATIVE
Hgb urine dipstick: NEGATIVE
Hyaline Cast: NONE SEEN /LPF
Ketones, ur: NEGATIVE
Leukocyte Esterase: NEGATIVE
Nitrites, Initial: NEGATIVE
RBC / HPF: NONE SEEN /HPF (ref 0–2)
Specific Gravity, Urine: 1.015 (ref 1.001–1.035)
WBC, UA: NONE SEEN /HPF (ref 0–5)
pH: 7 (ref 5.0–8.0)

## 2024-09-21 LAB — WET PREP FOR TRICH, YEAST, CLUE

## 2024-09-21 LAB — NO CULTURE INDICATED

## 2024-09-21 NOTE — Progress Notes (Signed)
 73 y.o. [id number] female s/p hysterectomy here for problem visit. Widowed.  Patient's last menstrual period was 12/01/2001.   She reports urinary symptoms of urinary urgency. Pelvic pain, back pain, frequent urgency and cannot stop flow. Urine sample provided: Yes  Has tried AZO, drinks lots of water  Nagging sx. Now with more frequent incontience   OB History  Gravida Para Term Preterm AB Living  2 2 1 1  3   SAB IAB Ectopic Multiple Live Births     1 3    # Outcome Date GA Lbr Len/2nd Weight Sex Type Anes PTL Lv  2 Term     F Vag-Vacuum   LIV  1A Preterm      Vag-Spont   LIV  1B Preterm     M    LIV   Past Medical History:  Diagnosis Date   Alcoholism (HCC)    Hx of alcohol abuse. Last abuse was 05/2022 when patient had to be hospitalized. She had recently lost her husband, Paties states that she now only drinks an occasional glass of wine.   Ambulates with cane    only when having an arthritis or sciatica flare   Anemia    no blood transfusions iron injections per pt   Anxiety    Arthritis    spine, shoulders and right hip   Cirrhosis (HCC)    liver, Follows w/ PCP, Dr. Layman Bee. No esophageal varices per 08/15/21 EGD in Epic.   Colon polyps    Depression    GERD (gastroesophageal reflux disease)    occasional   Hepatitis C    treated and cured   History of bleeding peptic ulcer    History of MRI of spine    Right foraminal stenosis at C2-3 due to spurring.   History of rectal fissure    repaired   Hypertension    Follows w/ PCP, Dr. Layman Bee.   Insomnia    takes Seroquel  prn   Neuromuscular disorder (HCC)    sciatica in right leg   Normal cardiac stress test 11/10/2006   [name]   Normal echocardiogram 08/19/2007   Wears dentures    Wears glasses    Past Surgical History:  Procedure Laterality Date   ANAL FISSURE REPAIR     CARPAL TUNNEL RELEASE Bilateral    COLONOSCOPY WITH ESOPHAGOGASTRODUODENOSCOPY (EGD)  08/15/2021   CYSTOSCOPY  N/A 09/08/2023   Procedure: CYSTOSCOPY;  Surgeon: Glennon Almarie POUR, MD;  Location: [facility];  Service: Gynecology;  Laterality: N/A;   laparotomy salpingectomy     ectopic pregnancy   ROBOTIC ASSISTED TOTAL HYSTERECTOMY WITH BILATERAL SALPINGO OOPHERECTOMY Bilateral 09/08/2023   Procedure: XI ROBOTIC ASSISTED TOTAL HYSTERECTOMY WITH BILATERAL SALPINGO OOPHORECTOMY;  Surgeon: Glennon Almarie POUR, MD;  Location: [facility];  Service: Gynecology;  Laterality: Bilateral;   ROTATOR CUFF REPAIR Right    x 2   TUBAL LIGATION     Current Outpatient Medications on File Prior to Visit  Medication Sig Dispense Refill   amLODipine  (NORVASC ) 10 MG tablet TAKE 1 TABLET BY MOUTH EVERY DAY 90 tablet 2   atorvastatin  (LIPITOR) 40 MG tablet TAKE 1 TABLET BY MOUTH EVERY DAY 90 tablet 0   Cholecalciferol (VITAMIN D -3) 1000 UNITS CAPS Take 1 capsule (1,000 Units total) by mouth daily. 30 capsule 6   diclofenac  Sodium (VOLTAREN ) 1 % GEL APPLY TO BACK UP TO FOUR TIMES A DAY 100 g 3   estradiol  (ESTRACE ) 0.01 % CREA vaginal  cream Place 1 Applicatorful vaginally at bedtime. Nightly for 3 weeks then three times a week thereafter 42.5 g 12   hydrOXYzine  (ATARAX ) 25 MG tablet TAKE 1 TABLET BY MOUTH EVERY 8 HOURS AS NEEDED. 270 tablet 1   nitrofurantoin , macrocrystal-monohydrate, (MACROBID ) 100 MG capsule Take 1 capsule (100 mg total) by mouth 2 (two) times daily. 14 capsule 0   QUEtiapine  (SEROQUEL ) 50 MG tablet TAKE TWO TABS BEFORE BED 180 tablet 1   sertraline  (ZOLOFT ) 100 MG tablet TAKE 1 TABLET BY MOUTH EVERY DAY 90 tablet 0   triamterene -hydrochlorothiazide  (MAXZIDE -25) 37.5-25 MG tablet TAKE 1 TABLET BY MOUTH EVERY DAY 90 tablet 0   vitamin B-12 (CYANOCOBALAMIN) 500 MCG tablet Take 500 mcg by mouth daily.     Zoster Vaccine Adjuvanted [facility]) injection Inject 0.5 ml IM and Repeat in 2 months (Patient not taking: Reported on 09/21/2024) 0.5 mL 1   No current  facility-administered medications on file prior to visit.   Allergies  Allergen Reactions   Aspirin      Causes stomach bleeds hx of ulcers      PE Today's Vitals   09/21/24 1348  BP: 130/80  Pulse: 71  Temp: 97.9 F (36.6 C)  TempSrc: Oral  SpO2: 98%  Weight: 193 lb (87.5 kg)  Height: 5' 4 (1.626 m)   Body mass index is 33.13 kg/m.  Physical Exam Vitals reviewed. Exam conducted with a chaperone present.  Constitutional:      General: She is not in acute distress.    Appearance: Normal appearance.  HENT:     Head: Normocephalic and atraumatic.     Nose: Nose normal.  Eyes:     Extraocular Movements: Extraocular movements intact.     Conjunctiva/sclera: Conjunctivae normal.  Pulmonary:     Effort: Pulmonary effort is normal.  Genitourinary:    General: Normal vulva.     Exam position: Lithotomy position.     Vagina: Normal. No vaginal discharge.     Uterus: Absent.      Adnexa: Right adnexa normal and left adnexa normal.  Musculoskeletal:        General: Normal range of motion.     Cervical back: Normal range of motion.  Neurological:     General: No focal deficit present.     Mental Status: She is alert.  Psychiatric:        Mood and Affect: Mood normal.        Behavior: Behavior normal.     Bladder catheterization: Bladder catheterization was performed after prepping the urethral opening with betadine  with a flexible 8 fr catheter. ~50cc was emptied from the bladder. Specimen was discarded as clean catch was previously collected.   Assessment and Plan:        Urinary urgency -     Urinalysis,Complete w/RFL Culture  Urge incontinence -     Ambulatory referral to Physical Therapy  Pelvic pain -     Ambulatory referral to Physical Therapy  Vaginal discharge -     WET PREP FOR TRICH, YEAST, CLUE  Other orders -     REFLEXIVE URINE CULTURE  Straight cath with normal PVR Cultures were negative Patient with worsening urge incontinence and pelvic  and lower back pain Discussed option for medications vs PFPT Patient elected for PFPT, referral sent  Vera LULLA Pa, MD

## 2024-09-22 ENCOUNTER — Telehealth: Payer: Self-pay | Admitting: *Deleted

## 2024-09-22 NOTE — Telephone Encounter (Signed)
 Per review of EPIC, referral was placed on 09/21/24.

## 2024-09-22 NOTE — Telephone Encounter (Signed)
-----   Message from Vera LULLA Pa sent at 09/21/2024  2:28 PM EST ----- Please help schedule UROGYN appt

## 2024-09-26 ENCOUNTER — Other Ambulatory Visit: Payer: Self-pay | Admitting: Family Medicine

## 2024-09-26 DIAGNOSIS — M5386 Other specified dorsopathies, lumbar region: Secondary | ICD-10-CM

## 2024-10-11 ENCOUNTER — Ambulatory Visit

## 2024-10-13 ENCOUNTER — Inpatient Hospital Stay: Admission: RE | Admit: 2024-10-13 | Discharge: 2024-10-13 | Attending: Obstetrics and Gynecology

## 2024-10-13 DIAGNOSIS — Z1231 Encounter for screening mammogram for malignant neoplasm of breast: Secondary | ICD-10-CM

## 2024-10-13 DIAGNOSIS — R102 Pelvic and perineal pain unspecified side: Secondary | ICD-10-CM

## 2024-10-25 ENCOUNTER — Ambulatory Visit: Admitting: Family Medicine

## 2024-10-25 NOTE — Progress Notes (Deleted)
° ° °  SUBJECTIVE:   CHIEF COMPLAINT / HPI:   Discussed the use of AI scribe software for clinical note transcription with the patient, who gave verbal consent to proceed.  History of Present Illness      PERTINENT  PMH / PSH: ***  OBJECTIVE:   LMP 12/01/2001    Physical Exam   ***  ASSESSMENT/PLAN:   Assessment and Plan Assessment & Plan     ***  Assessment & Plan Sciatica associated with disorder of lumbar spine On tramadol , PDMP reviewed Following with ortho, had MRI 10/22 which showed progressive severe lumbar spine degeneration, new spinal stenosis and foraminal stenosis in the lumbar spine. Ok to continue tramadol , this greatly assists with completing ADLs and tasks such as lawn care, laundry, house work   Payton Coward, MD [facility]

## 2024-10-25 NOTE — Assessment & Plan Note (Deleted)
 On tramadol , PDMP reviewed Following with ortho, had MRI 10/22 which showed progressive severe lumbar spine degeneration, new spinal stenosis and foraminal stenosis in the lumbar spine. Ok to continue tramadol , this greatly assists with completing ADLs and tasks such as lawn care, laundry, house work

## 2024-11-01 ENCOUNTER — Ambulatory Visit: Admitting: Family Medicine

## 2024-11-01 NOTE — Progress Notes (Deleted)
" ° ° °  SUBJECTIVE:   CHIEF COMPLAINT / HPI:   Discussed the use of AI scribe software for clinical note transcription with the patient, who gave verbal consent to proceed.  History of Present Illness      PERTINENT  PMH / PSH: ***  OBJECTIVE:   LMP 12/01/2001    Physical Exam   ***  ASSESSMENT/PLAN:   Assessment and Plan Assessment & Plan    ***  Assessment & Plan Sciatica associated with disorder of lumbar spine  Colon cancer screening  Essential hypertension   She is due for repeat colonoscopy.  Placed referral  Payton Coward, MD [facility] "

## 2024-11-07 ENCOUNTER — Encounter: Payer: Self-pay | Admitting: Family Medicine

## 2024-11-07 ENCOUNTER — Ambulatory Visit: Admitting: Family Medicine

## 2024-11-07 VITALS — BP 162/91 | HR 82 | Ht 64.0 in | Wt 190.6 lb

## 2024-11-07 DIAGNOSIS — M25512 Pain in left shoulder: Secondary | ICD-10-CM

## 2024-11-07 DIAGNOSIS — M5386 Other specified dorsopathies, lumbar region: Secondary | ICD-10-CM | POA: Diagnosis not present

## 2024-11-07 DIAGNOSIS — Z1211 Encounter for screening for malignant neoplasm of colon: Secondary | ICD-10-CM | POA: Diagnosis not present

## 2024-11-07 DIAGNOSIS — I10 Essential (primary) hypertension: Secondary | ICD-10-CM | POA: Diagnosis not present

## 2024-11-07 MED ORDER — TRAMADOL HCL 50 MG PO TABS: 50.0000 mg | ORAL_TABLET | Freq: Two times a day (BID) | ORAL | 0 refills | Status: DC | PRN

## 2024-11-07 NOTE — Assessment & Plan Note (Addendum)
 MRI from 08/2024 showing progressive disease - spinal stenosis and lumbar spine degeneration Recommend f/u with orthopedics for next steps Ok to continue tramadol  as this assists with her ADLs and completing tasks around the home. Discussed safe storage and signs/symptoms of withdrawal. Sent refill

## 2024-11-07 NOTE — Assessment & Plan Note (Addendum)
 Uncontrolled, patient prefers to keep log at home given recent stressors and return within 4 weeks. Asymptomatic  Continue current regimen for now

## 2024-11-07 NOTE — Progress Notes (Signed)
" ° ° °  SUBJECTIVE:   CHIEF COMPLAINT / HPI:   HTN On amlodipine  10 and triamterene -hydrochlorothiazide  37.5-25 Also on lipitor    Discussed the use of AI scribe software for clinical note transcription with the patient, who gave verbal consent to proceed.  History of Present Illness Latoya Swanson is a 73 year old female who presents with worsening shoulder pain.  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 based on prior MRI findings - Orthopedic evaluation in October; did not follow up after imaging - Possible fibromyalgia suggested by a physician assistant - Considering spinal injection after hearing of positive results from a family member  Analgesic use and pain management - Currently taking Tramadol  for pain control - Requests refill of Tramadol  - Keeps medication stored safely at home - Lives alone - denies withdrawal symptoms or cravings  Blood pressure fluctuations - Monitors blood pressure at home - Recent readings in the 130s mmHg, with higher readings today attributed to stress from a family argument and traffic issues en route - Adheres to prescribed antihypertensive medications - denies HA, SOB, CP, Abd pain     PERTINENT  PMH / PSH: reviewed  OBJECTIVE:   BP (!) 162/91   Pulse 82   Ht 5' 4 (1.626 m)   Wt 190 lb 9.6 oz (86.5 kg)   LMP 12/01/2001   SpO2 98%   BMI 32.72 kg/m    General: NAD, pleasant, able to participate in exam Respiratory: No  respiratory distress MSK: L shoulder without gross deformity. Somewhat tender along AC joint and deltoid. Limited ROM to about 90 degrees flexion/abduction due to pain. Neg hawkins Skin: warm and dry, no rashes noted Psych: Normal affect and mood  ASSESSMENT/PLAN:     Assessment & Plan Sciatica associated with disorder of lumbar spine MRI from 08/2024 showing progressive disease - spinal stenosis and lumbar spine degeneration Recommend f/u with orthopedics for next steps Ok to continue tramadol  as this assists with her ADLs and completing tasks around the home. Discussed safe storage and signs/symptoms of withdrawal. Sent refill Essential hypertension Uncontrolled, patient prefers to keep log at home given recent stressors and return within 4 weeks. Asymptomatic  Continue current regimen for now Left shoulder pain, unspecified chronicity Hx ?subacromial bursitis with good response to CSI in the past Unable to do this today but advised close f/u appt should she want another injection, vs f/u with ortho No obvious sign of radiculopathy Recommend voltaren  and supportive care in the meantime Colon cancer screening She is due for repeat colonoscopy.  Placed referral   Payton Coward, MD [facility] "

## 2024-11-07 NOTE — Patient Instructions (Addendum)
 [facility] [address] [place],  [place]  [address]  Main: [phone]  Please make another appointment for shoulder injection   VISIT SUMMARY: You visited us  today due to worsening shoulder pain and chronic low back pain. We discussed your pain management, blood pressure fluctuations, and general health maintenance.  YOUR PLAN: RIGHT SHOULDER BURSITIS AND OSTEOARTHRITIS: You have severe pain and limited movement in your right shoulder, likely due to bursitis and osteoarthritis. You had rotator cuff surgery on this shoulder eight years ago. -We referred you to an orthopedic specialist for a follow-up and potential corticosteroid injection. -Continue using Voltaren  gel for pain management. -You can also take Aleve or ibuprofen for additional pain relief.  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 -We provided you with contact information for an orthopedic specialist for a follow-up appointment. -We discussed the potential benefits of spinal injections and surgery if injections are not effective.  ESSENTIAL HYPERTENSION: Your blood pressure readings have been variable, likely due to recent stress. -We rechecked your blood pressure during the visit. -Continue taking your current antihypertensive medications, Amlodipine  and Triamterene -hydrochlorothiazide . -Plan for a follow-up visit when your stress levels decrease to reassess blood pressure control.  GENERAL HEALTH MAINTENANCE: You are due for a colonoscopy. -We submitted a new referral for a colonoscopy.                      Contains text generated by [employer]bridge.                                 Contains text generated by [employer]bridge.

## 2024-11-08 ENCOUNTER — Other Ambulatory Visit: Payer: Self-pay | Admitting: Family Medicine

## 2024-11-17 ENCOUNTER — Ambulatory Visit: Payer: Self-pay | Admitting: Family Medicine

## 2024-11-17 ENCOUNTER — Ambulatory Visit: Admitting: Family Medicine

## 2024-11-17 VITALS — BP 151/94 | HR 72 | Ht 64.0 in | Wt 196.4 lb

## 2024-11-17 DIAGNOSIS — M25512 Pain in left shoulder: Secondary | ICD-10-CM

## 2024-11-17 DIAGNOSIS — E669 Obesity, unspecified: Secondary | ICD-10-CM | POA: Diagnosis not present

## 2024-11-17 DIAGNOSIS — M7552 Bursitis of left shoulder: Secondary | ICD-10-CM | POA: Diagnosis not present

## 2024-11-17 MED ORDER — METHYLPREDNISOLONE ACETATE 40 MG/ML IJ SUSP
40.0000 mg | Freq: Once | INTRAMUSCULAR | Status: AC
  Administered 2024-11-17: 40 mg via INTRAMUSCULAR

## 2024-11-17 NOTE — Progress Notes (Signed)
" ° ° °  SUBJECTIVE:   CHIEF COMPLAINT / HPI:   L shoulder pain Hx subacromial bursitis has responded well to CSI in the past Would like to get injection today   Weight loss [facility] Has been gaining weight for a few years She joined the [employer] but has not been consistent about going   PERTINENT  PMH / PSH: obesity BMI 33, hx subacromial bursitis  OBJECTIVE:   BP (!) 151/94   Pulse 72   Ht 5' 4 (1.626 m)   Wt 196 lb 6.4 oz (89.1 kg)   LMP 12/01/2001   SpO2 98%   BMI 33.71 kg/m   General: NAD, pleasant, able to participate in exam Respiratory: No respiratory distress Skin: warm and dry, no rashes noted Psych: Normal affect and mood  MSK: L shoulder restricted ROM to about 90 degrees abduction/flexion. TTP over [facility] joint. Stable from recent exam ROM slightly improved after injection  ASSESSMENT/PLAN:   Assessment & Plan Left shoulder pain, unspecified chronicity Subacromial bursitis of left shoulder joint Injection as below, tolerated well, discussed return precautions and supportive care Obesity (BMI 30-39.9) Referral placed to medical weight mgmt  Subacromial Joint Injection, L Shoulder After discussion on risks/benefits/indications, informed verbal consent was obtained. A timeout was then performed. Patient was seated on table in exam room. The patient's shoulder was prepped with alcohol swabs and utilizing posterior approach a 21G, 1.5 needle was directed anteriorly and laterally into the patient's subacromial space was injected with 4:1 lidocaine :depomedrol with appreciation of free-flowing of the injectate into the bursal space. Patient tolerated the procedure well without immediate complications.   BP elevated today likely 2/2 pain, f/u at future visit  Payton Coward, MD [facility] "

## 2024-11-17 NOTE — Patient Instructions (Addendum)
 Keep your injection site clean and monitor for signs of infection (redness, pus, fevers) or bleeding  You should hear back from the weight clinic soon  [facility] [address] [place],  [place]  [address]  Main: [phone]

## 2024-11-28 NOTE — Telephone Encounter (Signed)
 Renda -can you f/u with status of referral.

## 2024-12-07 ENCOUNTER — Other Ambulatory Visit: Payer: Self-pay | Admitting: Family Medicine

## 2024-12-07 DIAGNOSIS — M5386 Other specified dorsopathies, lumbar region: Secondary | ICD-10-CM

## 2025-05-15 ENCOUNTER — Encounter
